# Patient Record
Sex: Female | Born: 1935 | Race: White | Hispanic: No | Smoking: Never smoker
Health system: Southern US, Community
[De-identification: ages and names within clinical notes are randomized; demographics above are authoritative.]

## PROBLEM LIST (undated history)

## (undated) DIAGNOSIS — R32 Unspecified urinary incontinence: Secondary | ICD-10-CM

## (undated) DIAGNOSIS — E1159 Type 2 diabetes mellitus with other circulatory complications: Secondary | ICD-10-CM

## (undated) DIAGNOSIS — M25559 Pain in unspecified hip: Secondary | ICD-10-CM

## (undated) DIAGNOSIS — F32A Depression, unspecified: Secondary | ICD-10-CM

## (undated) DIAGNOSIS — I872 Venous insufficiency (chronic) (peripheral): Secondary | ICD-10-CM

## (undated) DIAGNOSIS — Z8673 Personal history of transient ischemic attack (TIA), and cerebral infarction without residual deficits: Secondary | ICD-10-CM

## (undated) DIAGNOSIS — M4800 Spinal stenosis, site unspecified: Secondary | ICD-10-CM

## (undated) DIAGNOSIS — E550 Rickets, active: Secondary | ICD-10-CM

## (undated) DIAGNOSIS — I739 Peripheral vascular disease, unspecified: Secondary | ICD-10-CM

## (undated) DIAGNOSIS — G450 Vertebro-basilar artery syndrome: Secondary | ICD-10-CM

## (undated) DIAGNOSIS — I10 Essential (primary) hypertension: Secondary | ICD-10-CM

## (undated) DIAGNOSIS — I471 Supraventricular tachycardia: Secondary | ICD-10-CM

## (undated) DIAGNOSIS — F329 Major depressive disorder, single episode, unspecified: Secondary | ICD-10-CM

## (undated) DIAGNOSIS — G609 Hereditary and idiopathic neuropathy, unspecified: Secondary | ICD-10-CM

## (undated) DIAGNOSIS — I4719 Other supraventricular tachycardia: Secondary | ICD-10-CM

## (undated) DIAGNOSIS — F419 Anxiety disorder, unspecified: Secondary | ICD-10-CM

## (undated) DIAGNOSIS — M25579 Pain in unspecified ankle and joints of unspecified foot: Secondary | ICD-10-CM

## (undated) DIAGNOSIS — R197 Diarrhea, unspecified: Secondary | ICD-10-CM

## (undated) DIAGNOSIS — M810 Age-related osteoporosis without current pathological fracture: Secondary | ICD-10-CM

## (undated) DIAGNOSIS — R269 Unspecified abnormalities of gait and mobility: Secondary | ICD-10-CM

## (undated) DIAGNOSIS — R151 Fecal smearing: Secondary | ICD-10-CM

## (undated) DIAGNOSIS — L84 Corns and callosities: Secondary | ICD-10-CM

## (undated) DIAGNOSIS — L2089 Other atopic dermatitis: Secondary | ICD-10-CM

## (undated) DIAGNOSIS — E559 Vitamin D deficiency, unspecified: Secondary | ICD-10-CM

## (undated) DIAGNOSIS — G459 Transient cerebral ischemic attack, unspecified: Secondary | ICD-10-CM

## (undated) DIAGNOSIS — H3530 Unspecified macular degeneration: Secondary | ICD-10-CM

## (undated) DIAGNOSIS — R609 Edema, unspecified: Secondary | ICD-10-CM

## (undated) DIAGNOSIS — S72143A Displaced intertrochanteric fracture of unspecified femur, initial encounter for closed fracture: Secondary | ICD-10-CM

## (undated) DIAGNOSIS — R21 Rash and other nonspecific skin eruption: Secondary | ICD-10-CM

## (undated) DIAGNOSIS — E785 Hyperlipidemia, unspecified: Secondary | ICD-10-CM

## (undated) DIAGNOSIS — M79609 Pain in unspecified limb: Secondary | ICD-10-CM

## (undated) DIAGNOSIS — G4700 Insomnia, unspecified: Secondary | ICD-10-CM

## (undated) DIAGNOSIS — G629 Polyneuropathy, unspecified: Secondary | ICD-10-CM

## (undated) DIAGNOSIS — M722 Plantar fascial fibromatosis: Secondary | ICD-10-CM

## (undated) HISTORY — DX: Rash and other nonspecific skin eruption: R21

## (undated) HISTORY — DX: Unspecified urinary incontinence: R32

## (undated) HISTORY — DX: Unspecified abnormalities of gait and mobility: R26.9

## (undated) HISTORY — DX: Other atopic dermatitis: L20.89

## (undated) HISTORY — PX: BREAST MASS EXCISION: SHX1267

## (undated) HISTORY — DX: Vitamin D deficiency, unspecified: E55.9

## (undated) HISTORY — DX: Personal history of transient ischemic attack (TIA), and cerebral infarction without residual deficits: Z86.73

## (undated) HISTORY — DX: Displaced intertrochanteric fracture of unspecified femur, initial encounter for closed fracture: S72.143A

## (undated) HISTORY — DX: Diarrhea, unspecified: R19.7

## (undated) HISTORY — DX: Hereditary and idiopathic neuropathy, unspecified: G60.9

## (undated) HISTORY — DX: Fecal smearing: R15.1

## (undated) HISTORY — PX: TONSILLECTOMY AND ADENOIDECTOMY: SHX28

## (undated) HISTORY — DX: Age-related osteoporosis without current pathological fracture: M81.0

## (undated) HISTORY — DX: Pain in unspecified hip: M25.559

## (undated) HISTORY — DX: Corns and callosities: L84

## (undated) HISTORY — DX: Peripheral vascular disease, unspecified: I73.9

## (undated) HISTORY — DX: Spinal stenosis, site unspecified: M48.00

## (undated) HISTORY — DX: Pain in unspecified limb: M79.609

## (undated) HISTORY — DX: Type 2 diabetes mellitus with other circulatory complications: E11.59

## (undated) HISTORY — DX: Edema, unspecified: R60.9

## (undated) HISTORY — DX: Pain in unspecified ankle and joints of unspecified foot: M25.579

## (undated) HISTORY — DX: Unspecified macular degeneration: H35.30

---

## 1962-10-18 HISTORY — PX: CHOLECYSTECTOMY: SHX55

## 1998-07-31 ENCOUNTER — Other Ambulatory Visit: Admission: RE | Admit: 1998-07-31 | Discharge: 1998-07-31 | Payer: Self-pay | Admitting: Obstetrics and Gynecology

## 1999-10-19 HISTORY — PX: INCISION / DRAINAGE HAND / FINGER: SUR695

## 1999-11-27 ENCOUNTER — Ambulatory Visit (HOSPITAL_COMMUNITY): Admission: RE | Admit: 1999-11-27 | Discharge: 1999-11-27 | Payer: Self-pay | Admitting: Gastroenterology

## 1999-11-27 ENCOUNTER — Encounter (INDEPENDENT_AMBULATORY_CARE_PROVIDER_SITE_OTHER): Payer: Self-pay | Admitting: Specialist

## 2000-05-08 ENCOUNTER — Inpatient Hospital Stay (HOSPITAL_COMMUNITY): Admission: EM | Admit: 2000-05-08 | Discharge: 2000-05-11 | Payer: Self-pay | Admitting: Orthopedic Surgery

## 2000-05-15 ENCOUNTER — Encounter (HOSPITAL_COMMUNITY): Admission: RE | Admit: 2000-05-15 | Discharge: 2000-05-15 | Payer: Self-pay | Admitting: *Deleted

## 2000-06-16 ENCOUNTER — Other Ambulatory Visit: Admission: RE | Admit: 2000-06-16 | Discharge: 2000-06-16 | Payer: Self-pay | Admitting: Obstetrics and Gynecology

## 2001-07-10 ENCOUNTER — Other Ambulatory Visit: Admission: RE | Admit: 2001-07-10 | Discharge: 2001-07-10 | Payer: Self-pay | Admitting: Obstetrics and Gynecology

## 2001-10-18 HISTORY — PX: DILATION AND CURETTAGE OF UTERUS: SHX78

## 2002-02-14 ENCOUNTER — Ambulatory Visit (HOSPITAL_COMMUNITY): Admission: RE | Admit: 2002-02-14 | Discharge: 2002-02-14 | Payer: Self-pay | Admitting: Obstetrics and Gynecology

## 2002-02-14 ENCOUNTER — Encounter (INDEPENDENT_AMBULATORY_CARE_PROVIDER_SITE_OTHER): Payer: Self-pay

## 2002-09-04 ENCOUNTER — Other Ambulatory Visit: Admission: RE | Admit: 2002-09-04 | Discharge: 2002-09-04 | Payer: Self-pay | Admitting: Obstetrics and Gynecology

## 2002-10-28 DIAGNOSIS — Z8673 Personal history of transient ischemic attack (TIA), and cerebral infarction without residual deficits: Secondary | ICD-10-CM

## 2002-10-28 HISTORY — DX: Personal history of transient ischemic attack (TIA), and cerebral infarction without residual deficits: Z86.73

## 2003-04-18 ENCOUNTER — Encounter: Admission: RE | Admit: 2003-04-18 | Discharge: 2003-04-18 | Payer: Self-pay | Admitting: Internal Medicine

## 2003-04-18 ENCOUNTER — Encounter: Payer: Self-pay | Admitting: Internal Medicine

## 2003-07-13 ENCOUNTER — Encounter: Payer: Self-pay | Admitting: *Deleted

## 2003-07-13 ENCOUNTER — Inpatient Hospital Stay (HOSPITAL_COMMUNITY): Admission: EM | Admit: 2003-07-13 | Discharge: 2003-07-14 | Payer: Self-pay | Admitting: Emergency Medicine

## 2003-07-26 ENCOUNTER — Encounter: Payer: Self-pay | Admitting: Cardiovascular Disease

## 2003-07-26 ENCOUNTER — Ambulatory Visit (HOSPITAL_COMMUNITY): Admission: RE | Admit: 2003-07-26 | Discharge: 2003-07-26 | Payer: Self-pay | Admitting: Cardiovascular Disease

## 2004-05-27 ENCOUNTER — Encounter: Admission: RE | Admit: 2004-05-27 | Discharge: 2004-05-27 | Payer: Self-pay | Admitting: Internal Medicine

## 2005-06-17 ENCOUNTER — Ambulatory Visit (HOSPITAL_COMMUNITY): Admission: RE | Admit: 2005-06-17 | Discharge: 2005-06-17 | Payer: Self-pay | Admitting: Gastroenterology

## 2005-09-13 ENCOUNTER — Ambulatory Visit (HOSPITAL_COMMUNITY): Admission: RE | Admit: 2005-09-13 | Discharge: 2005-09-13 | Payer: Self-pay | Admitting: Internal Medicine

## 2006-08-17 ENCOUNTER — Encounter: Admission: RE | Admit: 2006-08-17 | Discharge: 2006-08-17 | Payer: Self-pay | Admitting: Internal Medicine

## 2007-05-25 ENCOUNTER — Encounter: Admission: RE | Admit: 2007-05-25 | Discharge: 2007-05-25 | Payer: Self-pay | Admitting: Internal Medicine

## 2007-10-25 DIAGNOSIS — R609 Edema, unspecified: Secondary | ICD-10-CM | POA: Insufficient documentation

## 2007-10-25 HISTORY — DX: Edema, unspecified: R60.9

## 2007-11-07 DIAGNOSIS — E559 Vitamin D deficiency, unspecified: Secondary | ICD-10-CM

## 2007-11-07 HISTORY — DX: Vitamin D deficiency, unspecified: E55.9

## 2008-02-05 DIAGNOSIS — L84 Corns and callosities: Secondary | ICD-10-CM

## 2008-02-05 HISTORY — DX: Corns and callosities: L84

## 2008-04-05 ENCOUNTER — Emergency Department (HOSPITAL_COMMUNITY): Admission: EM | Admit: 2008-04-05 | Discharge: 2008-04-05 | Payer: Self-pay | Admitting: Emergency Medicine

## 2008-05-13 DIAGNOSIS — R32 Unspecified urinary incontinence: Secondary | ICD-10-CM

## 2008-05-13 HISTORY — DX: Unspecified urinary incontinence: R32

## 2008-07-04 ENCOUNTER — Encounter: Admission: RE | Admit: 2008-07-04 | Discharge: 2008-07-04 | Payer: Self-pay | Admitting: Internal Medicine

## 2008-08-07 DIAGNOSIS — M25579 Pain in unspecified ankle and joints of unspecified foot: Secondary | ICD-10-CM

## 2008-08-07 HISTORY — DX: Pain in unspecified ankle and joints of unspecified foot: M25.579

## 2008-10-18 DIAGNOSIS — M25559 Pain in unspecified hip: Secondary | ICD-10-CM

## 2008-10-18 HISTORY — DX: Pain in unspecified hip: M25.559

## 2008-11-04 ENCOUNTER — Encounter: Admission: RE | Admit: 2008-11-04 | Discharge: 2008-11-04 | Payer: Self-pay | Admitting: Internal Medicine

## 2008-11-15 ENCOUNTER — Encounter: Admission: RE | Admit: 2008-11-15 | Discharge: 2008-11-15 | Payer: Self-pay | Admitting: Sports Medicine

## 2008-11-18 DIAGNOSIS — M4800 Spinal stenosis, site unspecified: Secondary | ICD-10-CM

## 2008-11-18 DIAGNOSIS — R269 Unspecified abnormalities of gait and mobility: Secondary | ICD-10-CM | POA: Insufficient documentation

## 2008-11-18 DIAGNOSIS — M79609 Pain in unspecified limb: Secondary | ICD-10-CM

## 2008-11-18 HISTORY — DX: Unspecified abnormalities of gait and mobility: R26.9

## 2008-11-18 HISTORY — DX: Pain in unspecified limb: M79.609

## 2008-11-18 HISTORY — DX: Spinal stenosis, site unspecified: M48.00

## 2009-08-11 ENCOUNTER — Ambulatory Visit: Payer: Self-pay | Admitting: Family Medicine

## 2009-08-11 DIAGNOSIS — E785 Hyperlipidemia, unspecified: Secondary | ICD-10-CM | POA: Insufficient documentation

## 2009-08-11 DIAGNOSIS — K589 Irritable bowel syndrome without diarrhea: Secondary | ICD-10-CM | POA: Insufficient documentation

## 2009-08-11 DIAGNOSIS — I10 Essential (primary) hypertension: Secondary | ICD-10-CM | POA: Insufficient documentation

## 2009-08-11 DIAGNOSIS — Z8679 Personal history of other diseases of the circulatory system: Secondary | ICD-10-CM | POA: Insufficient documentation

## 2009-08-29 ENCOUNTER — Encounter: Admission: RE | Admit: 2009-08-29 | Discharge: 2009-08-29 | Payer: Self-pay | Admitting: Internal Medicine

## 2009-12-18 ENCOUNTER — Encounter: Admission: RE | Admit: 2009-12-18 | Discharge: 2009-12-18 | Payer: Self-pay | Admitting: Neurology

## 2010-02-15 HISTORY — PX: HIP FRACTURE SURGERY: SHX118

## 2010-03-04 DIAGNOSIS — S72143A Displaced intertrochanteric fracture of unspecified femur, initial encounter for closed fracture: Secondary | ICD-10-CM

## 2010-03-04 HISTORY — DX: Displaced intertrochanteric fracture of unspecified femur, initial encounter for closed fracture: S72.143A

## 2010-03-05 ENCOUNTER — Inpatient Hospital Stay (HOSPITAL_COMMUNITY): Admission: EM | Admit: 2010-03-05 | Discharge: 2010-03-09 | Payer: Self-pay | Admitting: Emergency Medicine

## 2010-03-09 ENCOUNTER — Encounter (INDEPENDENT_AMBULATORY_CARE_PROVIDER_SITE_OTHER): Payer: Self-pay | Admitting: Cardiovascular Disease

## 2010-08-07 IMAGING — RF DG HIP COMPLETE 2+V*R*
1 series · 8 of 8 positions shown · non-contrast
Comparison: 03/05/2010.

CLINICAL DATA: Right hip fracture.  ORIF.

RIGHT HIP - COMPLETE 2+ VIEW

[Series 1: run · 8 of 8 slices shown]
[im 1/8]
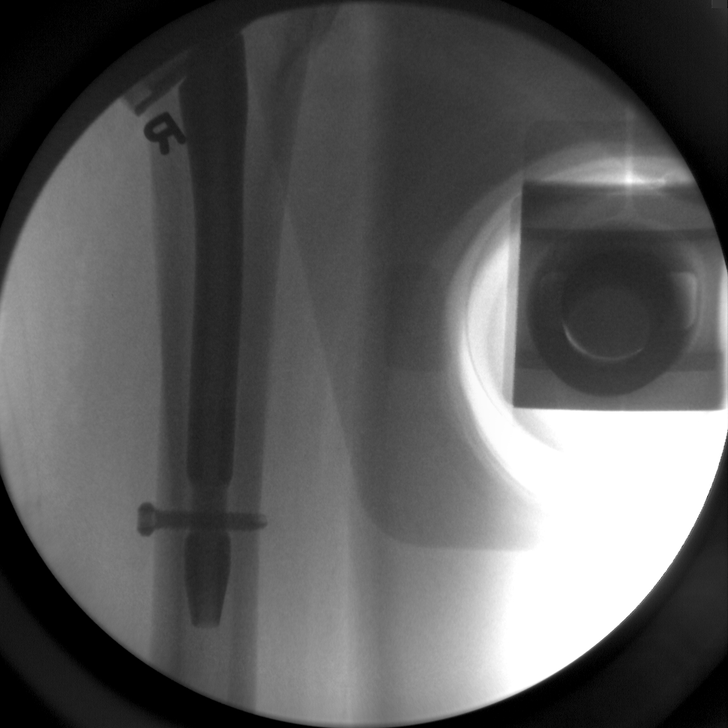
[im 2/8]
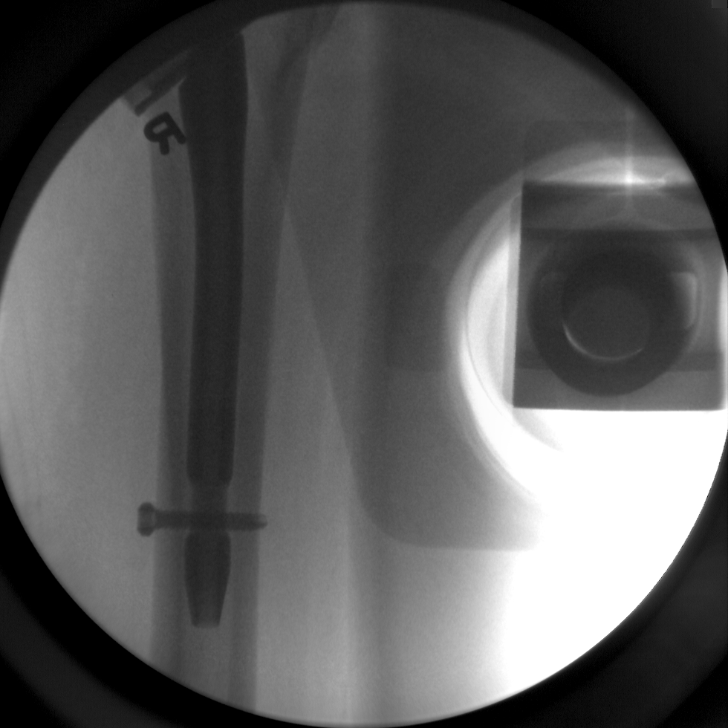
[im 3/8]
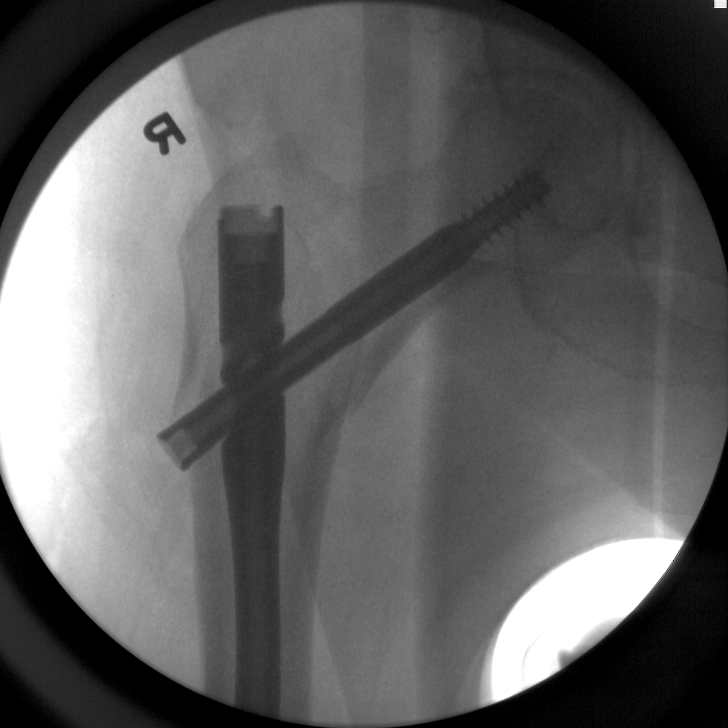
[im 4/8]
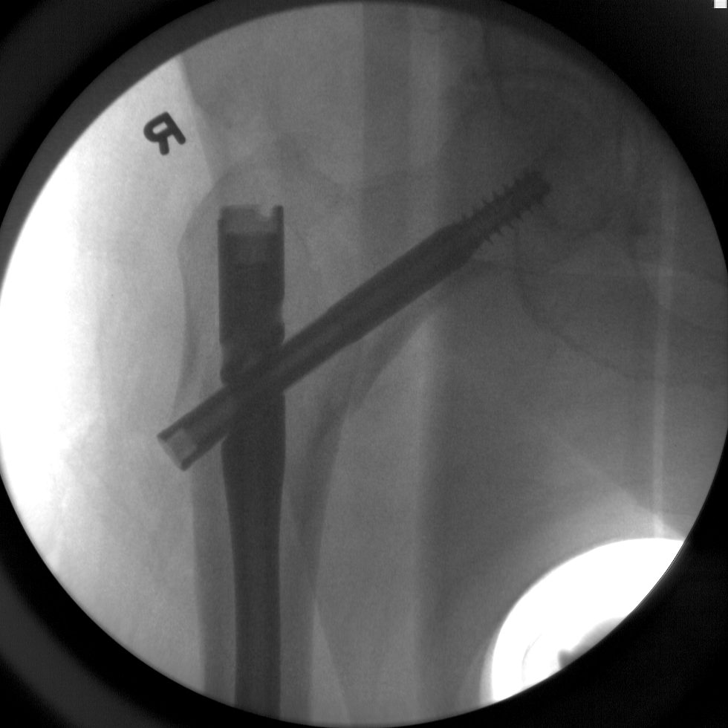
[im 5/8]
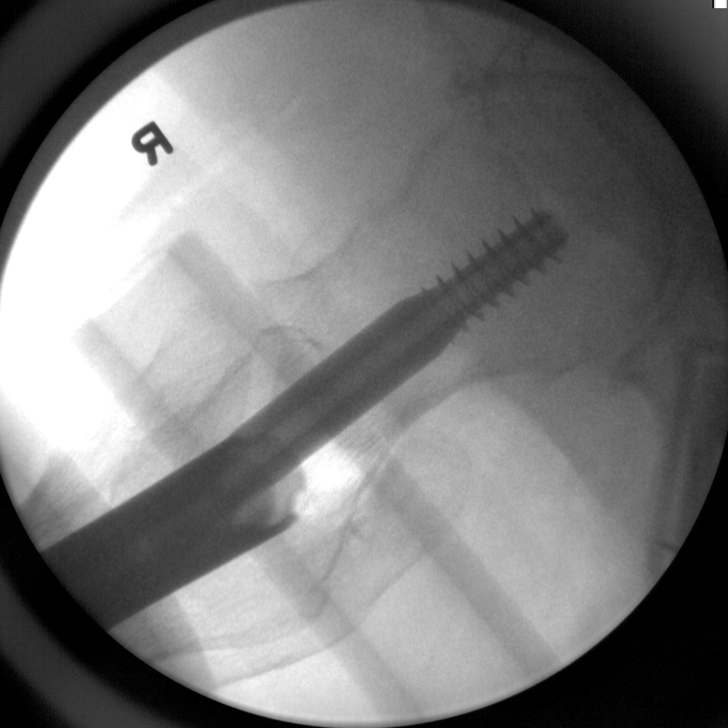
[im 6/8]
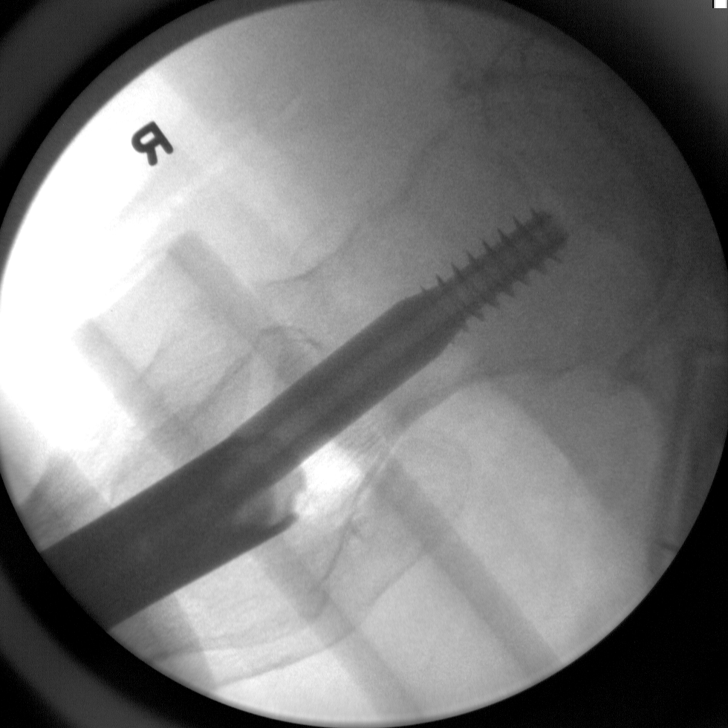
[im 7/8]
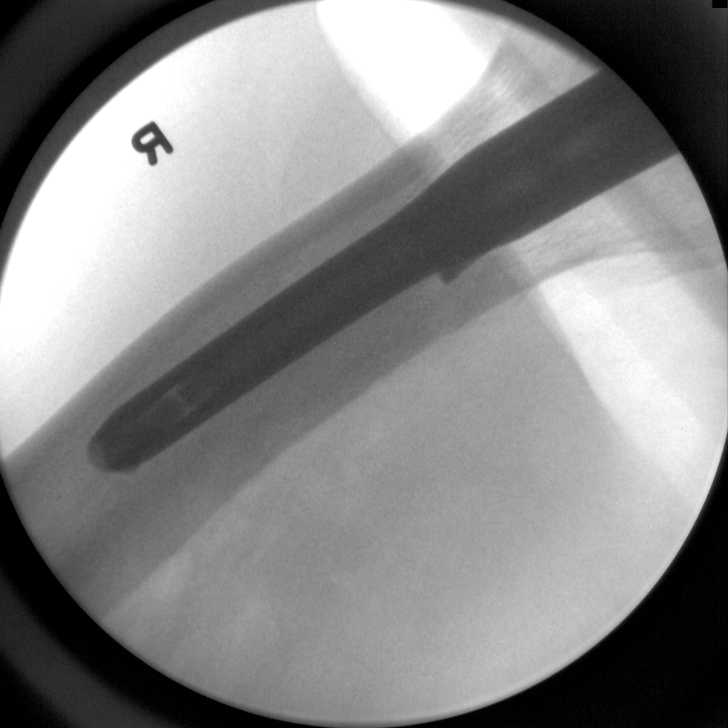
[im 8/8]
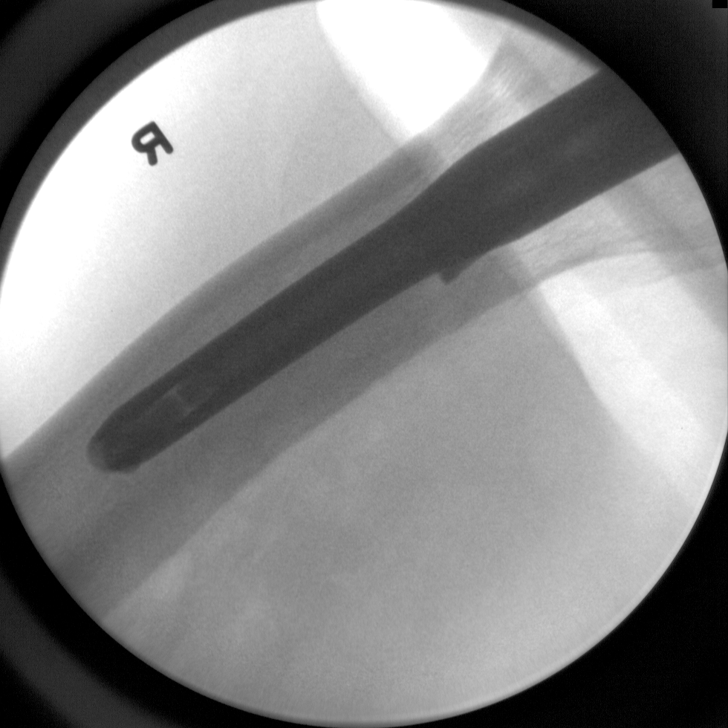

[8 of 8 positions shown; findings below may reference images not displayed]

FINDINGS: Intertrochanteric fracture has  been fixed with a
compression screw and nail.  Hardware is in good position.  The
fracture is in satisfactory alignment.
IMPRESSION: Satisfactory fracture fixation.

## 2010-08-07 IMAGING — CR DG CHEST 1V
1 series · 1 of 1 positions shown · non-contrast
Comparison: None

CLINICAL DATA: Right hip fracture status post fall

CHEST - 1 VIEW

[t chest supine]
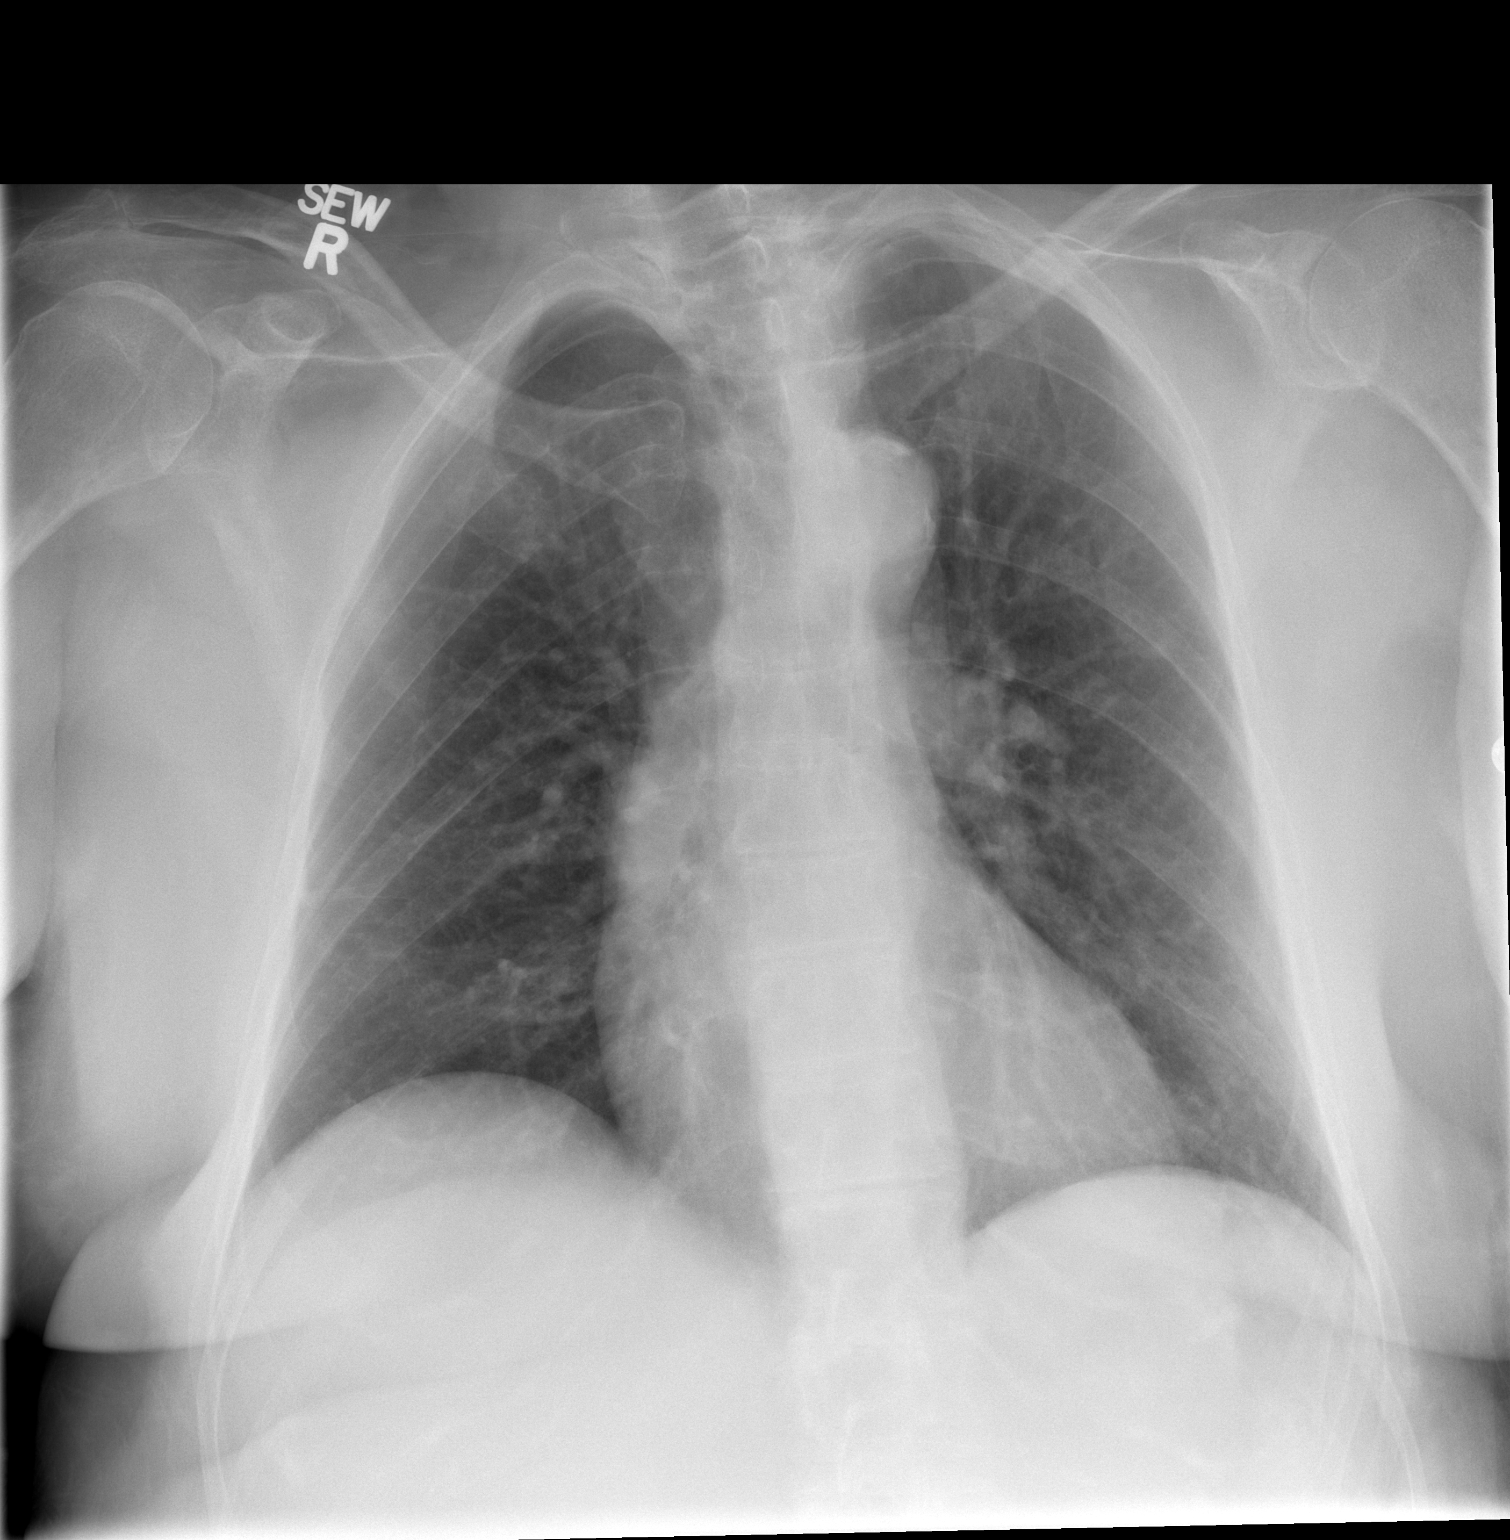

[1 of 1 positions shown; findings below may reference images not displayed]

FINDINGS: Normal heart size and pulmonary vascularity.
Atherosclerotic calcifications of mildly tortuous thoracic aorta.
Lungs clear.
Bones demineralized.
IMPRESSION: No acute abnormalities.

## 2010-09-17 ENCOUNTER — Encounter: Admission: RE | Admit: 2010-09-17 | Discharge: 2010-09-17 | Payer: Self-pay | Admitting: Internal Medicine

## 2010-11-08 ENCOUNTER — Encounter: Payer: Self-pay | Admitting: Gastroenterology

## 2010-12-18 HISTORY — PX: COLONOSCOPY: SHX174

## 2010-12-18 HISTORY — PX: ESOPHAGOGASTRODUODENOSCOPY ENDOSCOPY: SHX5814

## 2010-12-21 DIAGNOSIS — R21 Rash and other nonspecific skin eruption: Secondary | ICD-10-CM

## 2010-12-21 HISTORY — DX: Rash and other nonspecific skin eruption: R21

## 2011-01-04 LAB — CBC
HCT: 29.7 % — ABNORMAL LOW (ref 36.0–46.0)
HCT: 32.2 % — ABNORMAL LOW (ref 36.0–46.0)
HCT: 33 % — ABNORMAL LOW (ref 36.0–46.0)
HCT: 42.6 % (ref 36.0–46.0)
Hemoglobin: 10.3 g/dL — ABNORMAL LOW (ref 12.0–15.0)
Hemoglobin: 11.2 g/dL — ABNORMAL LOW (ref 12.0–15.0)
Hemoglobin: 11.3 g/dL — ABNORMAL LOW (ref 12.0–15.0)
MCHC: 34.3 g/dL (ref 30.0–36.0)
MCHC: 34.5 g/dL (ref 30.0–36.0)
MCHC: 34.6 g/dL (ref 30.0–36.0)
MCHC: 34.8 g/dL (ref 30.0–36.0)
MCV: 96.5 fL (ref 78.0–100.0)
MCV: 96.5 fL (ref 78.0–100.0)
MCV: 97 fL (ref 78.0–100.0)
MCV: 97.1 fL (ref 78.0–100.0)
Platelets: 210 10*3/uL (ref 150–400)
Platelets: 211 10*3/uL (ref 150–400)
Platelets: 220 10*3/uL (ref 150–400)
Platelets: 297 10*3/uL (ref 150–400)
RBC: 3.39 MIL/uL — ABNORMAL LOW (ref 3.87–5.11)
RBC: 4.44 MIL/uL (ref 3.87–5.11)
RDW: 13.1 % (ref 11.5–15.5)
RDW: 13.2 % (ref 11.5–15.5)
RDW: 13.3 % (ref 11.5–15.5)
RDW: 13.3 % (ref 11.5–15.5)
WBC: 14.1 10*3/uL — ABNORMAL HIGH (ref 4.0–10.5)
WBC: 15.9 10*3/uL — ABNORMAL HIGH (ref 4.0–10.5)

## 2011-01-04 LAB — BASIC METABOLIC PANEL
BUN: 6 mg/dL (ref 6–23)
BUN: 8 mg/dL (ref 6–23)
CO2: 22 mEq/L (ref 19–32)
CO2: 26 mEq/L (ref 19–32)
CO2: 26 mEq/L (ref 19–32)
Calcium: 8.8 mg/dL (ref 8.4–10.5)
Calcium: 9 mg/dL (ref 8.4–10.5)
Chloride: 104 mEq/L (ref 96–112)
Chloride: 104 mEq/L (ref 96–112)
Chloride: 105 mEq/L (ref 96–112)
Chloride: 106 mEq/L (ref 96–112)
Creatinine, Ser: 0.79 mg/dL (ref 0.4–1.2)
Creatinine, Ser: 0.82 mg/dL (ref 0.4–1.2)
GFR calc Af Amer: >60 mL/min (ref >60)
GFR calc Af Amer: >60 mL/min (ref >60)
GFR calc Af Amer: >60 mL/min (ref >60)
GFR calc non Af Amer: >60 mL/min (ref >60)
GFR calc non Af Amer: >60 mL/min (ref >60)
Glucose, Bld: 197 mg/dL — ABNORMAL HIGH (ref 70–99)
Glucose, Bld: 219 mg/dL — ABNORMAL HIGH (ref 70–99)
Potassium: 3.6 mEq/L (ref 3.5–5.1)
Potassium: 3.8 mEq/L (ref 3.5–5.1)
Potassium: 4 mEq/L (ref 3.5–5.1)
Potassium: 4.1 mEq/L (ref 3.5–5.1)
Sodium: 135 mEq/L (ref 135–145)
Sodium: 137 mEq/L (ref 135–145)
Sodium: 137 mEq/L (ref 135–145)

## 2011-01-04 LAB — URINALYSIS, ROUTINE W REFLEX MICROSCOPIC
Bilirubin Urine: NEGATIVE
Glucose, UA: NEGATIVE mg/dL
Hgb urine dipstick: NEGATIVE
Protein, ur: NEGATIVE mg/dL
Urobilinogen, UA: 0.2 mg/dL (ref 0.0–1.0)

## 2011-01-04 LAB — CK TOTAL AND CKMB (NOT AT ARMC)
Relative Index: INVALID (ref 0.0–2.5)
Total CK: 77 U/L (ref 7–177)

## 2011-01-04 LAB — BLOOD GAS, ARTERIAL
Acid-base deficit: 1.6 mmol/L (ref 0.0–2.0)
Bicarbonate: 22.1 mEq/L (ref 20.0–24.0)
O2 Saturation: 95.9 %
Patient temperature: 98.6
TCO2: 23.1 mmol/L (ref 0–100)
pO2, Arterial: 71.7 mmHg — ABNORMAL LOW (ref 80.0–100.0)

## 2011-01-04 LAB — COMPREHENSIVE METABOLIC PANEL
AST: 30 U/L (ref 0–37)
Albumin: 3 g/dL — ABNORMAL LOW (ref 3.5–5.2)
Calcium: 8.8 mg/dL (ref 8.4–10.5)
Creatinine, Ser: 0.81 mg/dL (ref 0.4–1.2)
GFR calc Af Amer: >60 mL/min (ref >60)
GFR calc non Af Amer: >60 mL/min (ref >60)

## 2011-01-04 LAB — GLUCOSE, CAPILLARY
Glucose-Capillary: 159 mg/dL — ABNORMAL HIGH (ref 70–99)
Glucose-Capillary: 193 mg/dL — ABNORMAL HIGH (ref 70–99)
Glucose-Capillary: 210 mg/dL — ABNORMAL HIGH (ref 70–99)
Glucose-Capillary: 216 mg/dL — ABNORMAL HIGH (ref 70–99)

## 2011-01-04 LAB — CARDIAC PANEL(CRET KIN+CKTOT+MB+TROPI)
CK, MB: 3.1 ng/mL (ref 0.3–4.0)
Relative Index: 1.6 (ref 0.0–2.5)
Relative Index: 1.6 (ref 0.0–2.5)
Total CK: 207 U/L — ABNORMAL HIGH (ref 7–177)
Total CK: 298 U/L — ABNORMAL HIGH (ref 7–177)
Troponin I: 0.03 ng/mL (ref 0.00–0.06)
Troponin I: 0.12 ng/mL — ABNORMAL HIGH (ref 0.00–0.06)
Troponin I: 0.16 ng/mL — ABNORMAL HIGH (ref 0.00–0.06)

## 2011-01-04 LAB — URINE CULTURE
Colony Count: NO GROWTH
Culture: NO GROWTH

## 2011-01-04 LAB — DIFFERENTIAL
Basophils Absolute: 0 10*3/uL (ref 0.0–0.1)
Basophils Absolute: 0.1 10*3/uL (ref 0.0–0.1)
Eosinophils Absolute: 0.2 10*3/uL (ref 0.0–0.7)
Eosinophils Absolute: 0.4 10*3/uL (ref 0.0–0.7)
Eosinophils Relative: 1 % (ref 0–5)
Eosinophils Relative: 1 % (ref 0–5)
Eosinophils Relative: 3 % (ref 0–5)
Lymphocytes Relative: 12 % (ref 12–46)
Lymphocytes Relative: 14 % (ref 12–46)
Lymphs Abs: 1.9 10*3/uL (ref 0.7–4.0)
Monocytes Absolute: 1.6 10*3/uL — ABNORMAL HIGH (ref 0.1–1.0)
Monocytes Absolute: 2.2 10*3/uL — ABNORMAL HIGH (ref 0.1–1.0)
Monocytes Relative: 11 % (ref 3–12)
Neutrophils Relative %: 81 % — ABNORMAL HIGH (ref 43–77)

## 2011-01-04 LAB — PHOSPHORUS
Phosphorus: 2.3 mg/dL (ref 2.3–4.6)
Phosphorus: 3 mg/dL (ref 2.3–4.6)
Phosphorus: 3.7 mg/dL (ref 2.3–4.6)

## 2011-01-04 LAB — POCT I-STAT, CHEM 8
BUN: 11 mg/dL (ref 6–23)
Chloride: 108 mEq/L (ref 96–112)
Creatinine, Ser: 0.7 mg/dL (ref 0.4–1.2)
Glucose, Bld: 206 mg/dL — ABNORMAL HIGH (ref 70–99)
Hemoglobin: 15.6 g/dL — ABNORMAL HIGH (ref 12.0–15.0)
Potassium: 4.2 mEq/L (ref 3.5–5.1)
Sodium: 136 mEq/L (ref 135–145)

## 2011-01-04 LAB — MAGNESIUM
Magnesium: 1.8 mg/dL (ref 1.5–2.5)
Magnesium: 2 mg/dL (ref 1.5–2.5)

## 2011-01-04 LAB — BRAIN NATRIURETIC PEPTIDE: Pro B Natriuretic peptide (BNP): 161 pg/mL — ABNORMAL HIGH (ref 0.0–100.0)

## 2011-01-04 LAB — PROTIME-INR: Prothrombin Time: 14 seconds (ref 11.6–15.2)

## 2011-01-04 LAB — TSH: TSH: 0.567 u[IU]/mL (ref 0.350–4.500)

## 2011-01-04 LAB — APTT: aPTT: 29 seconds (ref 24–37)

## 2011-03-02 NOTE — Consult Note (Signed)
NAME:  Kara Harris, Kara Harris NO.:  [id number]   MEDICAL RECORD NO.:  [id number]          PATIENT TYPE:  EMS   LOCATION:  MAJO                         FACILITY:  [facility]   PHYSICIAN:  Levert Feinstein, MD          DATE OF BIRTH:  1923-06-28   DATE OF CONSULTATION:  04/05/2008  DATE OF DISCHARGE:                                 CONSULTATION   CHIEF COMPLAINT:  Stroke.   HISTORY OF PRESENT ILLNESS:  Kara Harris is an 77 year old right-handed female  with past medical history of hypertension, hyperlipidemia, urinary  urgency and reported history of TIA in 2004, presenting with acute onset  of left upper and lower extremity paraesthesia and weakness when she  woke up at 8 o'clock this morning, trying to go to the bathroom.  The  symptom lasted about 5 minutes, gradually subsided, now she is back to  her baseline.   REVIEW OF SYSTEMS:  She denied visual change, lateralized motor and  sensory deficit, chest pain, headache, GI or GU symptoms.   PAST MEDICAL HISTORY:  1. Hypertension.  2. Hyperlipidemia.  3. TIA.   SURGICAL HISTORY:  Cholecystectomy.  Breast, benign tumor.   SOCIAL HISTORY:  She denies smoking, drinking.  Resigned.  Currently in  retirement home for past 3 years.   FAMILY HISTORY:  Mother died of intracranial bleeding at age 73.  Father  died at age 52 from stroke and colon cancer.   PHYSICAL EXAMINATION:  VITAL SIGNS:  She is afebrile.  Blood pressure  128/62, heart rate of 70, and respirations of 21.  CARDIAC:  Regular rate and rhythm.  PULMONARY:  Clear to auscultation bilaterally.  NECK:  Supple.  No carotid bruits.  NEUROLOGICAL:  She is alert, oriented to history taking and casual  conversation.  Cranial nerves II-XII.  Pupils were equal, round,  reactive to light and accommodation.  Fundi were sharp bilaterally.  Visual fields were full on confrontational test.  Facial sensation and  strength were normal.  Uvula and tongue midline.  Head turning, shoulder  shrugging were normal and symmetric.  Tongue protrusion into cheek  strength was normal.  Hearing was intact to finger rubbing bilaterally.  Motor examination:  Normal tone, bulk, and strength.  Sensory normal to  light touch, pinprick.  There was no extinction on double spontaenous  stimulation.  Deep tendon reflexes were normal, symmetric.  Plantar  responses are flexor. Coordination:  Normal finger-to-nose, heel-to-  shin.  Gait was deferred.   CT of the head without contrast reviewed.  there was no acute change.   LAB EVALUATION:  Demonstrated normal CBC with mildly elevated glucose  level and troponin was negative.   ASSESSMENT AND PLAN:  1. An 77 year old female with risk factor of hypertension,      hyperlipidemia, presenting with acute but short-lasting left      hemiparesis and subjective weakness, which indicates a possible      right internal capsule transient ischemic attack.  I have discussed      with the patient because she reported to have  extensive previous      stroke evaluation including MRI, an echocardiogram, which were was      reported to be normal, and because of the transient characteristic      of her symtpoms and likely, it is a small vessel disease, and she      is waiting to continue the stroke workup as an outpatient.  She is      to follow up with me in [facility] in 1-2 weeks.  2. I changed her aspirin from 81 to 325.  Prescription was provided.      Levert Feinstein, MD  Electronically Signed     YY/MEDQ  D:  04/05/2008  T:  04/06/2008  Job:  [id number]

## 2011-03-05 NOTE — Op Note (Signed)
[facility]  Patient:    Kara Harris, Kara Harris                         MRN: [id number] Proc. Date: 05/08/00 Adm. Date:  [id number] Attending:  Dominica Severin                           Operative Report  DATE OF BIRTH:  [birth year]  PREOPERATIVE DIAGNOSIS:  Status post cat bite to the right index finger with ascending cellulitis, red streaks, and areas of pyruance, 24 hours out from injury.  POSTOPERATIVE DIAGNOSIS:  Status post cat bite to the right index finger with ascending cellulitis, red streaks, and areas of pyruance, 24 hours out from injury with septic proximal interphalangeal joint and extensor tendon laceration to the right index finger.  PROCEDURE PERFORMED:  I&D of skin and subcutaneous tissue and extensor tendon, and PIP joint.  This patient had an open PIP joint secondary to a cat bite. Capsulotomy was performed gently and thorough irrigation applied.  SURGEON:  Elisha Ponder, M.D.  ASSISTANT:  None.  COMPLICATIONS:  None.  ANESTHESIA:  Local 1% lidocaine.  TOURNIQUET TIME:  Less than 30 minutes.  INDICATIONS FOR PROCEDURE:  This patient is a very pleasant 77 year old white female who presents with the above mentioned diagnosis.  I have counseled her in regards to the risks and benefits of I&D including bleeding, infection, anesthesia, damage to normal structures, and failure of surgery to accomplish its intended goals of relieving symptoms and restoring function.  With this in mind, she desires to proceed.  Her written H&P is included in the chart.  DESCRIPTION OF PROCEDURE:  The patient had lidocaine block placed sterilely by myself and performed with intermetacarpal block away from the area of cat bite.  She had swollen and infected-appearing PIP joint with red streaks, ascending cellulitis, etc. noted.  After metacarpal block was employed, the patient then underwent Betadine scrub by myself followed by sterile draping.  Once  this was done, an incision was made under tourniquet control about the ulnar aspect of the finger where two cat bites were located.  The patient had friable and _________ skin removed from the areas of entrance about the cat bites dorsal ulnarly.  These areas were opened and access to the deeper structures was obtained.  The proximal bite did not have significant extensor tendon encroachment, however, the distal bite penetrated the extensor tendon and went into the PIP joint.  Thus, the area was debrided including skin, subcutaneous tissue, and tendon.  A probe was used which identified the lacerated tendon that communicated with the PIP joint.  Thus, the PIP joint was opened with a blunt surgical instrument and a portion of the inflamed tissue removed from this region.  The PIP joint was then copiously irrigated. There was no atherogenic instability created during this.  The patient did have an open PIP joint.  Cultures were taken intraoperatively of the pyruant material, egressing from the regions.  This was sent for gram stain, C&S, aerobic and anaerobic culture.  The patient had I&D performed without difficulty.  Copious amounts of irrigation were applied to the wound.  Following this, the patient had a through-and-through wick dressing placed to my satisfaction.  The patient had adequate skin coverage over the joint and tendinous regions.  The wounds were left open and sutures were placed.  Hemostasis was obtained with electrocautery.  The patient then had a sterile dressing consisting of Adaptic gauze and a compressive wrap placed.  The patient tolerated the procedure well without difficulty.  She will be admitted for IV antibiotics, observation, and wound care.  Will treat this infection aggressively.  I have discussed with her these issues at length. There were no complications.  All questions were encouraged and answered. DD:  05/08/00 TD:  05/10/00 Job: [id number]

## 2011-03-05 NOTE — Op Note (Signed)
NAME:  Kara Harris, Kara Harris                  ACCOUNT NO.:  [id number]   MEDICAL RECORD NO.:  [id number]         PATIENT TYPE:  AMB   LOCATION:  ENDO                         FACILITY:  [facility]   PHYSICIAN:  James L. Malon Kindle., M.D.DATE OF BIRTH:  07/05/1923   DATE OF PROCEDURE:  06/17/2005  DATE OF DISCHARGE:                                 OPERATIVE REPORT   PROCEDURE:  Colonoscopy.   MEDICATIONS:  Fentanyl 85 mcg, versed 8.5 mg IV.   INDICATION:  Strong family history of colon cancer.   DESCRIPTION OF PROCEDURE:  The procedure was explained to the patient and  consent obtained.  With the patient in the left lateral decubitus position,  the Olympus scope was inserted and advanced.  Prep was excellent.  Using  position changes, we were able to see the ileocecal valve and the  appendiceal orifice.  The scope was withdrawn and the mucosa carefully  examined.  The mucosal pattern throughout was normal with no edema, colitis,  etc.  There were no polyps seen throughout the entire colon.  No significant  diverticular disease.  The scope was withdrawn.  The patient tolerated the  procedure well.   ASSESSMENT:  Family history of colon cancer with negative colonoscopy this  time (V16.0).   PLAN:  I will recommend yearly Hemoccults and repeat colonoscopy in 5 years.           ______________________________  Llana Aliment Malon Kindle., M.D.     Waldron Session  D:  06/17/2005  T:  06/17/2005  Job:  [id number]   cc:   Thora Lance, M.D.  [address]  [place]  [place] [address]  Fax: [phone]

## 2011-03-05 NOTE — Discharge Summary (Signed)
NAME:  Kara Harris, Kara Harris [facility]                          ACCOUNT NO.:  [id number]   MEDICAL RECORD NO.:  [id number]                   PATIENT TYPE:  INP   LOCATION:  2036                                 FACILITY:  [facility]   PHYSICIAN:  Nicki Guadalajara, M.D.                  DATE OF BIRTH:  08/25/1923   DATE OF ADMISSION:  07/13/2003  DATE OF DISCHARGE:  07/14/2003                                 DISCHARGE SUMMARY   HISTORY:  Ms. [name] is a 77 year old white female patient of Dr.  Nanetta Batty and Dr. Valentina Lucks, who came to the emergency room after being  at an Urgent Care Center secondary to paroxysmal supraventricular  tachycardia.  She was at the [employer], she felt dizzy, she had  palpitations, they seemed to subside, she drove herself to an Urgent Care  Center, there here heart rate was 167 and her blood pressure was 95/74, her  EKG shows questionable AVNRT, she also stated she had some presyncope with  this and some rising up in her chest feeling, no nausea or vomiting, some  shortness of breath.  She recently was put in study trial for urgency, she  stated a dosage, it was not effected, it was recently just doubled prior to  her leaving for the [employer] today she did take one of her study drug  pills, this is the only change in any of her medications.  She does have a  prior medical history of hypertension, hyperlipidemia, an overactive  bladder.  She has no known coronary disease.  She was seen by Dr. Charlette Caffey in  the hospital and it was decided to discontinue her spironolactone which was  used for hypertension and put her on a beta-blocker.  She was put on  Lopressor 25 mg b.i.d.  She was seen by Dr. Tresa Endo on July 14, 2003, she  apparently had no further arrythmia.  He felt comfortable with sending her  home, discontinuing the study drug and placing her on Toprol XL 50 mg once a  day.  She will get a 2D echocardiogram and persantine Cardiolite test as an  outpatient in our office.   LABORATORY DATA:  Hemoglobin 15.9, hematocrit 47.1, WBC 13, platelets were  266,000.  INR was 1.1, PTT was 37, sodium 132, potassium 4.2, glucose 106,  BUN 20.  CK/MB's were negative x2.  Troponin the second one was positive at  0.10, this was probably related to her heart rate.  TSH was 1.664.   DISCHARGE MEDICATIONS:  1. Toprol XL 50 mg daily.  2. Enteric-coated aspirin 81 mg daily.  3. Zocor 20 mg at bedtime.  4. Multivitamin and calcium as taken previously.   She did discontinue her study drug and her spironolactone.   DISCHARGE ACTIVITIES:  As tolerated.  She will need to have a 2D  echocardiogram and persantine Cardiolite.   DISCHARGE INSTRUCTIONS:  Our office will call her with the results of these.   DISCHARGE DIAGNOSES:  1. 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  2.     Hypertension.  3. Dyslipidemia.  4. Urinary urgency.      Lezlie Octave, N.P.                        Nicki Guadalajara, M.D.    BB/MEDQ  D:  07/14/2003  T:  07/14/2003  Job:  [id number]   cc:   Nanetta Batty, M.D.  [address]  [place]  [place] [address]  Fax: [phone]   Valentina Lucks, M.D.

## 2011-03-05 NOTE — Op Note (Signed)
[facility]  Patient:    Kara Harris, Kara Harris [facility] Visit Number: [id number] MRN: [id number]          Service Type: DSU Location: [facility] Attending Physician:  Morene Antu Dictated by:   Sherry A. Rosalio Macadamia, M.D. Proc. Date: 02/14/02 Admit Date:  02/14/2002                             Operative Report  PREOPERATIVE DIAGNOSES:       1. Postmenopausal bleeding.                               2. Bicornuate uterus.  POSTOPERATIVE DIAGNOSES:      1. Postmenopausal bleeding.                               2. Bicornuate uterus.  OPERATION:                    D&C and hysteroscopy.  SURGEON:                      Sherry A. Rosalio Macadamia, M.D.  ANESTHESIA:                   MAC.  INDICATIONS:                  This is a 77 year old G0, P0 woman who has a known history of bicornuate uterus.  The patient has a double cervix and double uterus.  The patient has had previous episodes of postmenopausal bleeding, and in 1995, had a D&C hysteroscopy for this with benign tissue found.  The patient noted recurrence of postmenopausal bleeding earlier this month.  Ultrasound was performed showing thickened endometrial tissue bilaterally, but with fluid present on the right side.  Because of this, the patient is brought to the operating room for [facility] hysteroscopy.  FINDINGS:                     Bicornuate uterus, mid position, normal size, no endometrial abnormality.  Only adventitial-type tissue present within the endometrial cavity, no polyps seen.  DESCRIPTION OF PROCEDURE:     The patient was brought into the operating room and given adequate IV sedation.  She was placed in the dorsolithotomy position.  The perineum was washed with Hibiclens.  The bladder was in and out catheterized.  Pelvic examination was performed.  The surgeons gowns and gloves were changed.  The patient was draped in a sterile fashion.  A speculum was placed within the vagina.  The vagina was washed  with Hibiclens. Paracervical block was administered with 1% Nesacaine.  The cervical connective tissue between the two cervical openings was grasped with a single tooth tenaculum.  The right cervix was sounded with a small sound and then dilated with Shawnie Pons dilators to a #21.  The left cervix was then sounded and dilated in a similar fashion.  The right cervix cornu and uterine horn was hysteroscoped with no significant findings.  The left cervix and uterine horn was then hysteroscoped with no significant findings.  Sharp curettage was performed on each uterine horn with only minimal tissue and bleeding present. No significant tissue was removed from either side.  Adequate hemostasis was present.  All instruments were removed from the vagina.  The patient was taken out of the dorsolithotomy position.  She was awakened, and she was moved from the operating table to the stretcher in stable condition.  Complications were none.  Estimated blood loss less than 5 cc. Sorbitol differential -50 cc. Dictated by:   Sherry A. Rosalio Macadamia, M.D. Attending Physician:  Morene Antu DD:  02/14/02 TD:  02/14/02 Job: [id number]

## 2011-03-05 NOTE — Discharge Summary (Signed)
[facility]  Patient:    Kara Harris                          MRN: [id number] Adm. Date:  [id number] Disc. Date: [id number] Attending:  Ephriam Knuckles H Dictator:   Grayland Jack, P.A.                           Discharge Summary  ADMITTING DIAGNOSES: 1. Infected right index finger secondary to cat bite. 2. Hypertension. 3. Hypercholesterolemia. 4. Overactive bladder.  DISCHARGE DIAGNOSES: 1. Status post irrigation and drainage infected right index finger. 2. Hypertension. 3. Hypercholesterolemia. 4. Overactive bladder.  CONSULTS:  None.  PROCEDURE:  Irrigation and drainage of right index finger under local anesthesia.  BRIEF HISTORY:  This 77 year old female was bitten by a stray cat on her right index finger.  The owners were unknown.  She was seen in the Urgent Care Center where she was given a tetanus shot and Augmentin to be taken by mouth. She noticed a significant increase in the redness, pain, and swelling.  She was brought to [facility] emergency department for further evaluation.  Dr. Amanda Pea was on call and asked to evaluate the patient.  He felt that she would best benefit from local debridement and admitted for IV antibiotics.  HOSPITAL COURSE:  May 08, 2000, the patient underwent the above-stated procedure under local anesthesia which she tolerated well without complications. She was admitted for IV antibiotics and was started on whirlpool treatments per physical therapy.  Cultures were sent; however, they either never made it to the lab or were accidentally destroyed in the lab. May 11, 2000, the patient was sitting on the edge of her bed eating breakfast.  She was without complaints of pain.  On exam, the wound was inspected and had dramatic improvement.  There was no erythema or discharge. There was some mild edema.  She was neurovascularly intact.  It was felt the patient was stable for discharge at this time.  LABORATORY  DATA:  On admission, CBC showed an elevated white blood cell at 15.3, hemoglobin 16.1, hematocrit 48.3.  RADIOLOGY:  X-rays of the right hand revealed soft tissue swelling of the proximal interphalangeal joint of the right index finger with some ______ changes at the distal interphalangeal joint.  CONDITION ON DISCHARGE:  Improved and stable.  DISCHARGE PLANS:  The patient is being discharged to home.  She may resume her home diet and home medications.  DISCHARGE MEDICATIONS:  Augmentin 875 mg p.o. b.i.d.  ACTIVITY:  No restrictions.  WOUND CARE:  She is to keep the wound clean and dry.  FOLLOW-UP:  She is to return to the clinic Friday for a wound check.  Please call [phone] for an appointment. DD:  05/30/00 TD:  05/31/00 Job: [id number]

## 2011-03-05 NOTE — Cardiovascular Report (Signed)
NAME:  Kara Harris, [place] [facility]                          ACCOUNT NO.:  [id number]   MEDICAL RECORD NO.:  [id number]                   PATIENT TYPE:  OIB   LOCATION:  2899                                 FACILITY:  [facility]   PHYSICIAN:  Nanetta Batty, M.D.                DATE OF BIRTH:  1922/12/13   DATE OF PROCEDURE:  07/26/2003  DATE OF DISCHARGE:                              CARDIAC CATHETERIZATION   INDICATIONS:  Ms. Olivier is a 77 year old moderately overweight white female  with history of PSVT and chest discomfort.  She had a Cardiolite which was  minimally positive for infarct with peri infarct ischemia.  She presents now  for diagnostic coronary arteriography.   PROCEDURE DESCRIPTION:  The patient was brought to the second floor [facility] in the postabsorptive state.  She  was premedicated with p.o. Valium.  Her right groin was prepped and shaved  in the usual sterile fashion.  1% Xylocaine was used for local anesthesia.  A 6-French sheath was inserted into the right femoral artery using standard  Seldinger technique.  6-French right and left Judkins diagnostic catheters  along with a 6-French pigtail catheter were used for selective coronary  angiography, left ventriculography, and distal abdominal aortography.  Omnipaque dye was used for the entirety of the case.  Retrograde aortic,  left ventricular, and pullback pressures were recorded.   HEMODYNAMICS:  1. Aortic systolic pressure 148, diastolic pressure 79.  2. Left ventricular systolic pressure 149, and diastolic pressure 49     (probably artifactual).   SELECTIVE CORONARY ANGIOGRAPHY:  1. Left main:  Normal.  2. LAD:  Normal.  3. Left circumflex:  Normal.  4. Ramus intermedius branch:  Normal.  5. Right coronary artery:  Dominant, normal.   LEFT VENTRICULOGRAPHY:  RAO left ventriculogram was performed using 25 mL of  Omnipaque dye at 12 mL/second.  The overall LV EF was estimated  greater than  60% without focal wall motion abnormalities.   DISTAL ABDOMINAL AORTOGRAPHY:  Distal abdominal aortogram was performed  using 20 mL of Omnipaque dye at 20 mL/second.  The renal arteries were  widely patent.  The infrarenal abdominal aorta and iliac bifurcation appear  free of significant atherosclerotic changes.   IMPRESSION:  Ms. Derocher has normal coronary arteries and normal left  ventricular function.  I believe her Cardiolite was falsely positive and  most likely artifactual.  Her chest pain was most likely noncardiac, though  chest  pain can be seen in the setting of PSVT.  The sheaths were removed and  pressure was held on the groin to achieve hemostasis.  The patient left the  laboratory in stable condition.  She will be discharged home later today as  an outpatient.  Will see me back in the office in two weeks.  Thora Lance, M.D. was notified of these results.  Nanetta Batty, M.D.    Cordelia Pen  D:  07/26/2003  T:  07/26/2003  Job:  [id number]   cc:   [facility]   Thora Lance, M.D.  [address]  [place]  [place] [address]  Fax: [phone]

## 2011-03-17 DIAGNOSIS — H3530 Unspecified macular degeneration: Secondary | ICD-10-CM

## 2011-03-17 DIAGNOSIS — G609 Hereditary and idiopathic neuropathy, unspecified: Secondary | ICD-10-CM

## 2011-03-17 HISTORY — DX: Hereditary and idiopathic neuropathy, unspecified: G60.9

## 2011-03-17 HISTORY — DX: Unspecified macular degeneration: H35.30

## 2011-07-12 ENCOUNTER — Other Ambulatory Visit: Payer: Self-pay | Admitting: Internal Medicine

## 2011-07-12 DIAGNOSIS — N644 Mastodynia: Secondary | ICD-10-CM

## 2011-07-15 LAB — CBC
Hemoglobin: 15.8 — ABNORMAL HIGH
Platelets: 268
RDW: 12.8
WBC: 9.6

## 2011-07-15 LAB — POCT I-STAT, CHEM 8
BUN: 18
Chloride: 108
Sodium: 136

## 2011-07-15 LAB — DIFFERENTIAL
Basophils Absolute: 0
Lymphocytes Relative: 21
Neutro Abs: 6.5

## 2011-07-15 LAB — POCT CARDIAC MARKERS
CKMB, poc: 2
Myoglobin, poc: 107
Operator id: 294501
Troponin i, poc: <0.05

## 2011-07-20 ENCOUNTER — Ambulatory Visit
Admission: RE | Admit: 2011-07-20 | Discharge: 2011-07-20 | Disposition: A | Discharge status: Home / Self Care | Payer: Medicare Other | Source: Ambulatory Visit | Attending: Internal Medicine | Admitting: Internal Medicine

## 2011-07-20 ENCOUNTER — Other Ambulatory Visit: Payer: Self-pay | Admitting: Internal Medicine

## 2011-07-20 DIAGNOSIS — N644 Mastodynia: Secondary | ICD-10-CM

## 2011-08-19 ENCOUNTER — Other Ambulatory Visit: Payer: Self-pay | Admitting: Internal Medicine

## 2011-08-19 DIAGNOSIS — Z1231 Encounter for screening mammogram for malignant neoplasm of breast: Secondary | ICD-10-CM

## 2011-08-20 ENCOUNTER — Other Ambulatory Visit: Payer: Self-pay | Admitting: Internal Medicine

## 2011-08-20 DIAGNOSIS — Z1231 Encounter for screening mammogram for malignant neoplasm of breast: Secondary | ICD-10-CM

## 2011-09-20 ENCOUNTER — Ambulatory Visit
Admission: RE | Admit: 2011-09-20 | Discharge: 2011-09-20 | Disposition: A | Discharge status: Home / Self Care | Payer: Medicare Other | Source: Ambulatory Visit | Attending: Internal Medicine | Admitting: Internal Medicine

## 2011-09-20 DIAGNOSIS — Z1231 Encounter for screening mammogram for malignant neoplasm of breast: Secondary | ICD-10-CM

## 2011-11-04 ENCOUNTER — Encounter (HOSPITAL_COMMUNITY): Payer: Self-pay | Admitting: *Deleted

## 2011-11-04 ENCOUNTER — Emergency Department (HOSPITAL_COMMUNITY)
Admission: EM | Admit: 2011-11-04 | Discharge: 2011-11-04 | Disposition: A | Discharge status: Home / Self Care | Payer: Medicare Other | Attending: Emergency Medicine | Admitting: Emergency Medicine

## 2011-11-04 DIAGNOSIS — S81009A Unspecified open wound, unspecified knee, initial encounter: Secondary | ICD-10-CM | POA: Insufficient documentation

## 2011-11-04 DIAGNOSIS — I10 Essential (primary) hypertension: Secondary | ICD-10-CM | POA: Insufficient documentation

## 2011-11-04 DIAGNOSIS — Z7982 Long term (current) use of aspirin: Secondary | ICD-10-CM | POA: Insufficient documentation

## 2011-11-04 DIAGNOSIS — S91009A Unspecified open wound, unspecified ankle, initial encounter: Secondary | ICD-10-CM | POA: Insufficient documentation

## 2011-11-04 DIAGNOSIS — W5501XA Bitten by cat, initial encounter: Secondary | ICD-10-CM

## 2011-11-04 DIAGNOSIS — IMO0001 Reserved for inherently not codable concepts without codable children: Secondary | ICD-10-CM | POA: Insufficient documentation

## 2011-11-04 DIAGNOSIS — F341 Dysthymic disorder: Secondary | ICD-10-CM | POA: Insufficient documentation

## 2011-11-04 DIAGNOSIS — Y92009 Unspecified place in unspecified non-institutional (private) residence as the place of occurrence of the external cause: Secondary | ICD-10-CM | POA: Insufficient documentation

## 2011-11-04 DIAGNOSIS — Z8673 Personal history of transient ischemic attack (TIA), and cerebral infarction without residual deficits: Secondary | ICD-10-CM | POA: Insufficient documentation

## 2011-11-04 DIAGNOSIS — E119 Type 2 diabetes mellitus without complications: Secondary | ICD-10-CM | POA: Insufficient documentation

## 2011-11-04 DIAGNOSIS — E785 Hyperlipidemia, unspecified: Secondary | ICD-10-CM | POA: Insufficient documentation

## 2011-11-04 DIAGNOSIS — Z79899 Other long term (current) drug therapy: Secondary | ICD-10-CM | POA: Insufficient documentation

## 2011-11-04 HISTORY — DX: Major depressive disorder, single episode, unspecified: F32.9

## 2011-11-04 HISTORY — DX: Vertebro-basilar artery syndrome: G45.0

## 2011-11-04 HISTORY — DX: Plantar fascial fibromatosis: M72.2

## 2011-11-04 HISTORY — DX: Insomnia, unspecified: G47.00

## 2011-11-04 HISTORY — DX: Venous insufficiency (chronic) (peripheral): I87.2

## 2011-11-04 HISTORY — DX: Transient cerebral ischemic attack, unspecified: G45.9

## 2011-11-04 HISTORY — DX: Polyneuropathy, unspecified: G62.9

## 2011-11-04 HISTORY — DX: Anxiety disorder, unspecified: F41.9

## 2011-11-04 HISTORY — DX: Depression, unspecified: F32.A

## 2011-11-04 HISTORY — DX: Hyperlipidemia, unspecified: E78.5

## 2011-11-04 HISTORY — DX: Supraventricular tachycardia: I47.1

## 2011-11-04 HISTORY — DX: Rickets, active: E55.0

## 2011-11-04 HISTORY — DX: Essential (primary) hypertension: I10

## 2011-11-04 HISTORY — DX: Other supraventricular tachycardia: I47.19

## 2011-11-04 NOTE — ED Notes (Signed)
D/c instructions reviewed w/ pt - pt denies any further questions or concerns at present.   

## 2011-11-04 NOTE — ED Provider Notes (Signed)
History     CSN: [id number]  Arrival date & time 11/04/11  1649   First MD Initiated Contact with Patient 11/04/11 1850      Chief Complaint  Patient presents with  . Animal Bite    (Consider location/radiation/quality/duration/timing/severity/associated sxs/prior treatment) Patient is a 78 y.o. female presenting with animal bite. The history is provided by the patient.  Animal Bite  The incident occurred today. The incident occurred at home. She came to the ER via personal transport. There is an injury to the right lower leg. The pain is mild. It is unlikely that a foreign body is present. Pertinent negatives include no chest pain, no numbness, no nausea, no vomiting, no headaches, no inability to bear weight, no pain when bearing weight, no weakness and no cough. There have been no prior injuries to these areas. Her tetanus status is UTD. She has been behaving normally. Recently, medical care has been given by the PCP. Services received include medications given.  Bite was pt own cat, all shots UTD.  Past Medical History  Diagnosis Date  . Hyperlipidemia   . Neuropathy   . Paroxysmal atrial tachycardia   . Diabetes mellitus   . Venous insufficiency   . Anxiety   . Depressive disorder   . Plantar fascial fibromatosis   . Insomnia   . Dysphagia   . TIA (transient ischemic attack)   . Rickets, active   . Hypertension   . Basilar artery syndrome     Past Surgical History  Procedure Date  . Hip fracture surgery   . Cholecystectomy     No family history on file.  History  Substance Use Topics  . Smoking status: Never Smoker   . Smokeless tobacco: Not on file  . Alcohol Use: No     Review of Systems  Respiratory: Negative for cough.   Cardiovascular: Negative for chest pain.  Gastrointestinal: Negative for nausea and vomiting.  Neurological: Negative for weakness, numbness and headaches.  10 systems reviewed and are negative for acute change except as noted in  the HPI.   Allergies  Codeine; Tramadol; and Vesicare  Home Medications   Current Outpatient Rx  Name Route Sig Dispense Refill  . ASPIRIN 325 MG PO TABS Oral Take 325 mg by mouth every 4 (four) hours as needed.    . ATENOLOL 25 MG PO TABS Oral Take 25 mg by mouth daily. TAKE 1/2 TABLET    . ATORVASTATIN CALCIUM 20 MG PO TABS Oral Take 20 mg by mouth at bedtime and may repeat dose one time if needed.    . CLOPIDOGREL BISULFATE 75 MG PO TABS Oral Take 75 mg by mouth daily.    Marland Kitchen CO-ENZYME Q-10 50 MG PO CAPS Oral Take 100 mg by mouth daily. TAKE 2 CAP(200MG )    . DIAZEPAM 5 MG PO TABS Oral Take 5 mg by mouth at bedtime as needed. TAKE 1/2 TABLET    . DIAZEPAM 5 MG PO TABS Oral Take 5 mg by mouth every 6 (six) hours as needed.    Marland Kitchen METFORMIN HCL 500 MG PO TABS Oral Take 500 mg by mouth 2 (two) times daily with a meal.    . POLYETHYLENE GLYCOL 3350 PO PACK Oral Take 17 g by mouth as needed.    Marland Kitchen SPIRONOLACTONE 50 MG PO TABS Oral Take 50 mg by mouth 2 (two) times daily.    . TOLTERODINE TARTRATE ER 2 MG PO CP24 Oral Take 2 mg by mouth 2 (  two) times daily.      BP 139/59  Pulse 82  Temp(Src) 98.1 F (36.7 C) (Oral)  Resp 18  SpO2 93%  Physical Exam  Nursing note and vitals reviewed. Constitutional: She appears well-developed and well-nourished. No distress.  HENT:  Head: Normocephalic and atraumatic.  Right Ear: External ear normal.  Left Ear: External ear normal.  Eyes: Conjunctivae are normal.  Neck: Normal range of motion. Neck supple.  Cardiovascular: Normal rate and regular rhythm.   Pulmonary/Chest: Effort normal. No respiratory distress.  Abdominal: Soft. She exhibits no distension. There is no tenderness.  Musculoskeletal: She exhibits no edema and no tenderness.  Neurological: She is alert.       Pt oriented to person, place, time. Does recall injury, but does not recall being treated at facility.  Skin: Skin is warm and dry.       3 3mm puncture wounds to lateral  lower left leg, no bleeding. Bandage with abx ointment in place.  Psychiatric: She has a normal mood and affect.    ED Course  Procedures (including critical care time)  Labs Reviewed - No data to display No results found.   1. Cat bite       MDM 7:00 PM Cat bite. Animal imm UTD. Pt Imm UTD. Pt has had augmentin at assisted living and has continued rx, per nursing staff. Advised that she would be d/c home. However, pt refuses to leave at this time. States she did not receive any antibiotic earlier today/does not recall receiving medication. Have tried to call [facility] but no answer. WIll continue to try to call.     8:00 PM Have spoken with a nurse a [facility] who states reporting nurse recalled breaking the augmentin pill in half and administering to patient. When I notified pt of this, she now recalls the events and is ready to be discharged home.        Elwyn Reach [place], [place] 11/04/11 2026

## 2011-11-04 NOTE — ED Notes (Signed)
Called [facility]. LPN responsible for pt sts order for Augmentin was written this am, and pt has received first dose.

## 2011-11-04 NOTE — ED Notes (Signed)
Pt states someone from [facility] is to pick her up shortly. Awaiting pt's ride. Pt given graham crackers and diet coke per pt request.

## 2011-11-04 NOTE — ED Provider Notes (Signed)
Medical screening examination/treatment/procedure(s) were performed by non-physician practitioner and as supervising physician I was immediately available for consultation/collaboration.  Tahjanae Blankenburg R. Maely Clements, MD 11/04/11 2308 

## 2011-11-04 NOTE — ED Notes (Signed)
Pt from [facility]. Brought here by their security. Sts her cat bit her this am on L lateral leg. Had abx ointment and bandaid applied by facility. Requested abx due to previous hospitalization r/t feral cat bite. Had order written by attending provider for augmentin but sts she has not received it yet. No redness noted around bandaid at site. Does not c/o pain.

## 2012-04-05 DIAGNOSIS — R151 Fecal smearing: Secondary | ICD-10-CM

## 2012-04-05 HISTORY — DX: Fecal smearing: R15.1

## 2012-08-14 DIAGNOSIS — I739 Peripheral vascular disease, unspecified: Secondary | ICD-10-CM

## 2012-08-14 HISTORY — DX: Peripheral vascular disease, unspecified: I73.9

## 2012-11-06 ENCOUNTER — Other Ambulatory Visit: Payer: Self-pay | Admitting: Internal Medicine

## 2012-11-06 DIAGNOSIS — Z1231 Encounter for screening mammogram for malignant neoplasm of breast: Secondary | ICD-10-CM

## 2012-11-06 LAB — HM MAMMOGRAPHY: HM Mammogram: NEGATIVE

## 2012-12-04 ENCOUNTER — Ambulatory Visit
Admission: RE | Admit: 2012-12-04 | Discharge: 2012-12-04 | Disposition: A | Discharge status: Home / Self Care | Payer: Medicare Other | Source: Ambulatory Visit | Attending: Internal Medicine | Admitting: Internal Medicine

## 2012-12-04 DIAGNOSIS — Z1231 Encounter for screening mammogram for malignant neoplasm of breast: Secondary | ICD-10-CM

## 2012-12-13 DIAGNOSIS — R197 Diarrhea, unspecified: Secondary | ICD-10-CM

## 2012-12-13 DIAGNOSIS — L2089 Other atopic dermatitis: Secondary | ICD-10-CM

## 2012-12-13 HISTORY — DX: Diarrhea, unspecified: R19.7

## 2012-12-13 HISTORY — DX: Other atopic dermatitis: L20.89

## 2013-02-12 ENCOUNTER — Non-Acute Institutional Stay: Payer: Medicare Other | Admitting: Internal Medicine

## 2013-02-12 VITALS — BP 118/62 | HR 80 | Wt 157.0 lb

## 2013-02-12 DIAGNOSIS — L209 Atopic dermatitis, unspecified: Secondary | ICD-10-CM | POA: Insufficient documentation

## 2013-02-12 DIAGNOSIS — E1159 Type 2 diabetes mellitus with other circulatory complications: Secondary | ICD-10-CM | POA: Insufficient documentation

## 2013-02-12 DIAGNOSIS — I10 Essential (primary) hypertension: Secondary | ICD-10-CM

## 2013-02-12 DIAGNOSIS — R32 Unspecified urinary incontinence: Secondary | ICD-10-CM | POA: Insufficient documentation

## 2013-02-12 DIAGNOSIS — R609 Edema, unspecified: Secondary | ICD-10-CM

## 2013-02-12 DIAGNOSIS — E119 Type 2 diabetes mellitus without complications: Secondary | ICD-10-CM

## 2013-02-12 DIAGNOSIS — E785 Hyperlipidemia, unspecified: Secondary | ICD-10-CM | POA: Insufficient documentation

## 2013-02-12 DIAGNOSIS — R21 Rash and other nonspecific skin eruption: Secondary | ICD-10-CM

## 2013-02-12 DIAGNOSIS — L2089 Other atopic dermatitis: Secondary | ICD-10-CM

## 2013-02-12 HISTORY — DX: Type 2 diabetes mellitus with other circulatory complications: E11.59

## 2013-02-12 NOTE — Patient Instructions (Signed)
Continue current medications. We will help arrange referrals to another cardiologist and urologist when she desires. She wants to think about the cardiology. We offered to arrange an appointment with Dr. Logan Bores, who she has seen in the past.

## 2013-02-12 NOTE — Progress Notes (Signed)
  Subjective:    Patient ID: Kara Harris, female    DOB: 1923-09-16, 79 y.o.   MRN: [id number]  HPI Has issues with a relative. Rash in the groin. Seeing dermatologist, Dr. Ethelene Hal at [facility] for various rashes. Chronic edema related to venous insufficiency. Compression stockings did not seem to help. She says she would like to see a different cardiologist and urologist. Has been seeing Dr. Allyson Sabal and Dr. Sherron Monday.   Review of Systems  Constitutional: Negative for fever, chills, activity change, appetite change and fatigue.  HENT: Negative.   Eyes: Negative.   Respiratory: Negative.   Cardiovascular: Negative.   Gastrointestinal: Negative.   Endocrine: Negative for cold intolerance, heat intolerance, polydipsia, polyphagia and polyuria.       Hx DM  Genitourinary:       Incontinence   Musculoskeletal: Positive for back pain, arthralgias and gait problem. Negative for myalgias and joint swelling.       Uses walker  Skin: Negative.   Allergic/Immunologic: Negative.   Neurological: Negative for tremors, seizures, syncope, facial asymmetry, speech difficulty, weakness, light-headedness and numbness.  Hematological: Negative.   Psychiatric/Behavioral: Positive for dysphoric mood. Negative for hallucinations, behavioral problems, confusion, self-injury, decreased concentration and agitation. The patient is not nervous/anxious and is not hyperactive.        Objective:   Physical Exam  Constitutional: She is oriented to person, place, and time. No distress.  overweight  HENT:  Head: Normocephalic and atraumatic.  Mouth/Throat: No oropharyngeal exudate.  Eyes: Pupils are equal, round, and reactive to light.  Neck: Normal range of motion. Neck supple. No JVD present. No tracheal deviation present. No thyromegaly present.  Cardiovascular: Normal rate, regular rhythm, normal heart sounds and intact distal pulses.  Exam reveals no gallop and no friction rub.   No  murmur heard. Pulmonary/Chest: Breath sounds normal. No respiratory distress. She has no wheezes. She has no rales.  Abdominal: She exhibits no distension and no mass. There is no tenderness.  Musculoskeletal: She exhibits edema. She exhibits no tenderness.  Lymphadenopathy:    She has no cervical adenopathy.  Neurological: She is oriented to person, place, and time. She has normal reflexes. No cranial nerve deficit. Coordination normal.  Skin: Skin is warm and dry. No rash noted. She is not diaphoretic. No pallor.  Psychiatric: Her behavior is normal. Judgment and thought content normal.  Depressed acting. Frustrated with her life.      Lab reports 11/23/12 CMP:; normal except glu 111  Lipids; TC 108, trig 132,, HDL 37, LDL 45 ---atorvastatin was stopped.  A1c 6.7    Assessment & Plan:  Type II or unspecified type diabetes mellitus without mention of complication, not stated as uncontrolled  Under good control. No change in medications. Hyperlipidemia  Needs laboratory followup next visit Rash and other nonspecific skin eruption  Continue her dermatology appointments Edema  Mild. Continue spironolactone. Unspecified urinary incontinence  Expresses unhappiness with her current urologist. She would like a referral in the future to a different urologist. Essential hypertension, benign  Controlled

## 2013-08-15 ENCOUNTER — Encounter: Payer: Self-pay | Admitting: Geriatric Medicine

## 2013-08-29 ENCOUNTER — Encounter: Payer: Self-pay | Admitting: Geriatric Medicine

## 2013-08-29 ENCOUNTER — Non-Acute Institutional Stay: Payer: Medicare Other | Admitting: Geriatric Medicine

## 2013-08-29 VITALS — BP 104/60 | HR 80 | Temp 98.3°F | Ht 65.0 in | Wt 157.0 lb

## 2013-08-29 DIAGNOSIS — E119 Type 2 diabetes mellitus without complications: Secondary | ICD-10-CM

## 2013-08-29 DIAGNOSIS — L409 Psoriasis, unspecified: Secondary | ICD-10-CM

## 2013-08-29 DIAGNOSIS — Z299 Encounter for prophylactic measures, unspecified: Secondary | ICD-10-CM

## 2013-08-29 DIAGNOSIS — R32 Unspecified urinary incontinence: Secondary | ICD-10-CM

## 2013-08-29 DIAGNOSIS — R269 Unspecified abnormalities of gait and mobility: Secondary | ICD-10-CM

## 2013-08-29 DIAGNOSIS — G4700 Insomnia, unspecified: Secondary | ICD-10-CM

## 2013-08-29 DIAGNOSIS — I872 Venous insufficiency (chronic) (peripheral): Secondary | ICD-10-CM

## 2013-08-29 DIAGNOSIS — L408 Other psoriasis: Secondary | ICD-10-CM

## 2013-08-29 DIAGNOSIS — E785 Hyperlipidemia, unspecified: Secondary | ICD-10-CM

## 2013-08-29 DIAGNOSIS — I10 Essential (primary) hypertension: Secondary | ICD-10-CM

## 2013-08-29 DIAGNOSIS — I739 Peripheral vascular disease, unspecified: Secondary | ICD-10-CM

## 2013-08-29 NOTE — Assessment & Plan Note (Signed)
Well controlled with current medication. Update lab

## 2013-08-29 NOTE — Assessment & Plan Note (Signed)
Recent fasting CBGs 123-145, continues metformin. Update lab

## 2013-08-29 NOTE — Progress Notes (Signed)
Patient ID: Garth Bigness, female   DOB: 05-13-1923, 79 y.o.   MRN: [id number] [facility] [phone])  Code Status: DNR  Contact Information   Name Relation Home Work Mobile   [facility]  [phone]     Bryan,Joan & Marguerita Beards [phone]         Chief Complaint  Patient presents with  . Medical Managment of Chronic Issues    Comprehensive Exam: blood sugar, blood pressure, depression    HPI: This is a 79 y.o. female resident of [facility] section. This patient has not had a hospitalization, serious acute illness or injury in the last year. Review of facility record shows this patient is blood pressure has been satisfactory range, weight has been stable, fasting blood sugars satisfactory. Patient continues to be unhappy in Assisted Living setting however she has been attending and enjoying [employer] 3 times a week, continues to take meals in the dining room.  Patient has been seen several times this year by Dr. Guss Bunde regarding venous insufficiency. Venous Doppler study was negative for DVT, his recommendation has been compression hose for mild LE edema. Patient does not wear this hose because she feels it makes her legs worse.  Peripheral vascular disease was last evaluated by Dr. Bobette Mo April 2014, no changes recommended.  The patient has had major dental work over the last few months, work is now complete. She denies any mouth or tooth pain, is eating well.  Patient has been evaluated by Dr. Ethelene Hal and treated for psoriasis with triamcinolone cream. Groin rash has improved.  Dr. Dagoberto Ligas continues to see patient at intervals, she does have some cataracts do not require intervention at this time. Patient has mild blepharitis under control this time. Patient has no diabetic retinopathy. Next evaluation will be January 2015  Urinary urge incontinence is unchanged, by Luisa Hart was not helpful, patient  stopped that medication. She does continue to take Detrol. The patient reports she feels this medicine is not helpful at all. She would like to return to urologist at [facility] for repeat evaluation.  Functional status Bathing: Moderate Assist Bladder Management: Pads, Bowel Management: Continent Diet / Swallowing: PO Diet: Regular Hygiene and Grooming: Independent, Toileting / Clothing: Independent Walk: Independent w/ walker    Allergies  Allergen Reactions  . Codeine     UNKNOWN  . Tramadol     UNKNOWN  . Vesicare [Solifenacin Succinate]     UNKNOWN      Medication List       This list is accurate as of: 08/29/13 11:59 PM.  Always use your most recent med list.               atenolol 25 MG tablet  Commonly known as:  TENORMIN  Take 25 mg by mouth daily. TAKE 1/2 TABLET     calcium-vitamin D 500-200 MG-UNIT per tablet  Commonly known as:  OSCAL WITH D  Take 1 tablet by mouth. Daily in morning     chlorhexidine 0.12 % solution  Commonly known as:  PERIDEX  Use as directed 15 mLs in the mouth or throat. Use as directed by Dr. Alvina Filbert     clobetasol cream 0.05 %  Commonly known as:  TEMOVATE  Apply 1 application topically 2 (two) times daily. To red and scaly areas     clopidogrel 75 MG tablet  Commonly known as:  PLAVIX  Take 75 mg by mouth daily with breakfast.  Erythromycin 2 % ointment  Apply 2 % topically 2 (two) times daily. Apply to inguinal folds twice daily for 14 days     ibuprofen 800 MG tablet  Commonly known as:  ADVIL,MOTRIN  Take 800 mg by mouth every 6 (six) hours as needed. For pain     loperamide 2 MG tablet  Commonly known as:  IMODIUM A-D  Take 2 mg by mouth. 1/2 to 1 tablet daily as needed for diarrhea     Melatonin 3 MG Caps  Take by mouth. Take one tablet at bedtime     metFORMIN 500 MG tablet  Commonly known as:  GLUCOPHAGE  Take 500 mg by mouth 2 (two) times daily with a meal.     polyethylene glycol packet  Commonly  known as:  MIRALAX / GLYCOLAX  Take 17 g by mouth as needed.     spironolactone 50 MG tablet  Commonly known as:  ALDACTONE  Take 50 mg by mouth 2 (two) times daily.     triamcinolone cream 0.1 %  Commonly known as:  KENALOG  Apply 1 application topically. Apply thin layer twice daily as needed for groin rash for no longer than 2 weeks at a time. Apply as needed for redness and itching        DATA REVIEWED  Radiologic Exams  [facility] List 2006- - Colonoscopy: nl exam 04/05/08 CT brain: normal 11/15/08 CT LS: Moderate multifactorial spinal/foraminal stenosis at L3-4, L4-5 - could cause neural compression. Foraminal narrowing on left at L5-S1- could affect L5 nerve root 08/2009: Mammogram- nl. exam 12/2009: MRA head, no contrast  CT brain, no contrast  CXR: Lungs well aerated. No focal opacities, effusion or pntx.  09/17/2010 Mammogram Normal  10/2010: Urodynamic study (at [facility]): confirmed voiding dysfunction 12/2010 Upper Endoscopy: slight gastritis  Colonoscopy: essentially normal exam 07/20/2011 [place] Left breast negative 09/20/2011 Mammogram negative    Cardiovascular Exams  [facility] List 2004 - cardiac cath: EF 60%. Normal coronary arteries 04/26/08 2D Echo: Normal LV function/size. Marland Kitchen No embolic source, no ASD.  07/18/08 EKG: SR 74BPM. Left axis deviation. Evidence of old inferior infarct 06/2009: EKG: Sinus rhythm, HR 68BPM. Borderline LVH, poor 'r' wave progression c/w old infarct 10/22/09 Doppler of legs: No DVT. Left knee effusion. 02/2010: 2D echocardiogram: LVEF 65-70%. Abnormal LV relaxation c/w  grade 1 diastolic dysfunction. AoV with mild regurgitation   Laboratory Studies   [facility] 08/03/2012 CBC: Wbc 11.3, Rbc 4.60, Hgb 14.2, Hct 41.5, Platelet 296  HgbA1c 6.7   11/2012:  Glucose 111, the BUN 11, creatinine 0.78, sodium 136, potassium 4.3.Protein/LFTs WNL   total cholesterol 108, triglycerides 132, HDL 37, LDL 45   A1c 6.7   Past Medical History  Diagnosis Date  .  Hyperlipidemia   . Neuropathy   . Paroxysmal atrial tachycardia   . Diabetes mellitus   . Venous insufficiency   . Anxiety   . Depressive disorder   . Plantar fascial fibromatosis   . Insomnia   . Dysphagia   . TIA (transient ischemic attack)   . Rickets, active   . Hypertension   . Basilar artery syndrome   . Other atopic dermatitis and related conditions 12/13/2012  . Diarrhea 12/13/2012  . Peripheral vascular disease, unspecified 08/14/2012  . Fecal smearing 04/05/2012  . Unspecified hereditary and idiopathic peripheral neuropathy 03/17/2011  . Macular degeneration (senile) of retina, unspecified 03/17/2011  . Rash and other nonspecific skin eruption 12/21/2010  . Closed fracture of intertrochanteric section of femur  03/04/2010  . Spinal stenosis, unspecified region other than cervical 11/18/2008  . Pain in limb 11/18/2008  . Abnormality of gait 11/18/2008  . Pain in joint, pelvic region and thigh 10/2008  . Pain in joint, ankle and foot 08/07/2008  . Unspecified urinary incontinence 05/13/2008  . Corns and callosities 02/05/2008  . Vitamin D deficiency 11/07/2007  . Edema 10/25/2007  . Transient ischemic attack (TIA), and cerebral infarction without residual deficits(V12.54) 10/28/2002   Past Surgical History  Procedure Laterality Date  . Hip fracture surgery Right 03/01/2010  . Cholecystectomy  1964  . Tonsillectomy and adenoidectomy      as a child  . Breast mass excision    . Incision / drainage hand / finger  2001    cat bite  . Dilation and curettage of uterus  2002-03-01    post menopausal bleeding. Bicornate uterus/doble cervix, benign path  . Colonoscopy  12/18/2010    Dr Randa Evens  . Esophagogastroduodenoscopy endoscopy  12/18/2010    Dr. Randa Evens slight gastritis   Family Status  Relation Status Death Age  . Brother Deceased     complications re: DM, 03-02-11  . Mother Deceased 59    cerebral hemorrhage  . Father Deceased 51    multiple causes   History   Social History Narrative    Patient is Widowed. No children. 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 Groin rash is improved EYES: No eye pain, dryness or itching  No change in vision EARS: No earache, tinnitus, change in hearing NOSE: No congestion, drainage or bleeding MOUTH/THROAT: No mouth or tooth pain    No sore throat   No difficulty chewing or swallowing Recent dental work RESPIRATORY: No cough, wheezing, SOB CARDIAC: No chest pain, palpitations  Feet swell CHEST/BREASTS: No discomfort, discharge or lumps in breasts GI: No abdominal pain    No N/V/D or constipation   No heartburn or reflux  GU: No dysuria, Chronic frequency, urgency , leakage  No change in urine volume or character MUSCULOSKELETAL: No joint pain, swelling or stiffness  No back pain  No muscle ache, pain, weakness  Gait is steady   No recent falls.  NEUROLOGIC: No dizziness, fainting, headache, has occasional tingling in her toes No change in mental status.  PSYCHIATRIC: No feelings of anxiety, depression is unchanged. Sleeps okay until 5 or 6 AM, takes melatonin and sleeps till 8 or 9 AM awakes rested.   Physical Exam Filed Vitals:   08/29/13 1015  BP: 104/60  Pulse: 80  Temp: 98.3 F (36.8 C)  TempSrc: Oral  Height: 5\' 5"  (1.651 m)  Weight: 157 lb (71.215 kg)   Body mass index is 26.13 kg/(m^2).  GENERAL APPEARANCE: No acute distress, appropriately groomed, normal body habitus. Alert, pleasant, conversant. SKIN: No diaphoresis,  unusual lesions, wounds.  Fading red, scaly rash left groin HEAD: Normocephalic, atraumatic EYES: Conjunctiva/lids clear. Pupils round, reactive. EOMs intact.  EARS: External exam WNL, canals  clear, TM WNL. Hearing decreased. NOSE: No deformity or discharge. MOUTH/THROAT: Lips w/o lesions. Oral mucosa, tongue moist, w/o lesion. Oropharynx w/o redness or lesions.  NECK: Supple, full ROM. No thyroid  tenderness, enlargement or nodule LYMPHATICS: No head, neck or supraclavicular adenopathy RESPIRATORY: Breathing is even, unlabored. Lung sounds are clear and full.  CHEST/BREASTS: No chest deformity. Breasts without tenderness, mass, discharge CARDIOVASCULAR: Heart RRR. No murmur or extra heart sounds  ARTERIAL: No carotid bruit. Carotid 2+, Distal pulses absent.   VENOUS: Superificial varicosities bilateral LE. No venous stasis skin changes  EDEMA: 1+ peripheral edema (feet). No ascites GASTROINTESTINAL: Abdomen is obese, soft, non-tender, not distended w/ normal bowel sounds. No hepatic or splenic enlargement. No mass, ventral or inguinal hernia. GENITOURINARY: Bladder non tender, not distended. MUSCULOSKELETAL: Moves all extremities with full ROM, strength and tone. Back with kyphosis, No scoliosis or spinal process tenderness. Gait is unsteady, better with walker NEUROLOGIC: Oriented to time, place, person. Cranial nerves 2-12 grossly intact, speech clear, no tremor. Patella, brachial DTR 1+. PSYCHIATRIC: Mood and affect appropriate to situation  ASSESSMENT/PLAN  Essential hypertension, benign Well controlled with current medication. Update lab  Type II or unspecified type diabetes mellitus with peripheral circulatory disorders, uncontrolled(250.72) Recent fasting CBGs 123-145, continues metformin. Update lab  Psoriasis Groin rash improved. Pt. Applies triamcinolone when flare occurs  Other and unspecified hyperlipidemia Most recent lipid panel normal, no current medication. Update lab  Venous insufficiency Superficial varicosities bilateral LE, bilateral pedal edema, unchanged. Not wearing compression hose "makes my legs worse". Encourage gentle exercise/  ambulation  Peripheral vascular disease, unspecified No palpable distal pulses, no rest pain or claudication. Skin is intact. Encourage daily walking program. Update lab (lipids)  Insomnia SOme delay in sleep initiation, sleeps until 5AM, then takes melatonin, sleeps til 8-9am. Awakes refreshed  Abnormality of gait Unsteady gait, longstanding, multifactorial. Ambulates safely with walker, no recent fall.  Unspecified urinary incontinence Stopped Myrbetriq 10/2012, has been taking Detrol for some time. Continues with frequency, dribbling, recently notes difficulty initiating urine flow. May be experiencing urinary retention, stop medication.  Patient is considering returning to Urologist at [facility]- she will make arrangments   Follow up: 3 months Dr. Chilton Si, 6 mo CTKrell, NP-C  Lab 08/30/13  CBC, CMP, Lipid, A1C  Andrew Soria T.Marguerite Jarboe, NP-C 08/29/2013

## 2013-08-29 NOTE — Assessment & Plan Note (Signed)
No palpable distal pulses, no rest pain or claudication. Skin is intact. Encourage daily walking program. Update lab (lipids)

## 2013-08-29 NOTE — Assessment & Plan Note (Signed)
Unsteady gait, longstanding, multifactorial. Ambulates safely with walker, no recent fall.

## 2013-08-29 NOTE — Assessment & Plan Note (Signed)
Groin rash improved. Pt. Applies triamcinolone when flare occurs

## 2013-08-29 NOTE — Assessment & Plan Note (Signed)
SOme delay in sleep initiation, sleeps until 5AM, then takes melatonin, sleeps til 8-9am. Awakes refreshed

## 2013-08-29 NOTE — Assessment & Plan Note (Signed)
Superficial varicosities bilateral LE, bilateral pedal edema, unchanged. Not wearing compression hose "makes my legs worse". Encourage gentle exercise/ ambulation

## 2013-08-29 NOTE — Assessment & Plan Note (Signed)
Most recent lipid panel normal, no current medication. Update lab

## 2013-08-29 NOTE — Assessment & Plan Note (Addendum)
Stopped Myrbetriq 10/2012, has been taking Detrol for some time. Continues with frequency, dribbling, recently notes difficulty initiating urine flow. May be experiencing urinary retention, stop medication.  Patient is considering returning to Urologist at [facility]- she will make arrangments

## 2013-08-30 LAB — CBC AND DIFFERENTIAL
HEMATOCRIT: 42 % (ref 36–46)
Hemoglobin: 14.3 g/dL (ref 12.0–16.0)
Platelets: 326 10*3/uL (ref 150–399)
WBC: 9.6 10^3/mL

## 2013-08-30 LAB — HEPATIC FUNCTION PANEL
ALT: 29 U/L (ref 7–35)
AST: 23 U/L (ref 13–35)
Alkaline Phosphatase: 61 U/L (ref 25–125)

## 2013-08-30 LAB — LIPID PANEL
Cholesterol: 194 mg/dL (ref 0–200)
HDL: 45 mg/dL (ref 35–70)
LDL CALC: 122 mg/dL
Triglycerides: 135 mg/dL (ref 40–160)

## 2013-08-30 LAB — BASIC METABOLIC PANEL
BUN: 11 mg/dL (ref 4–21)
Creatinine: 0.8 mg/dL (ref 0.5–1.1)
GLUCOSE: 116 mg/dL
Potassium: 4.3 mmol/L (ref 3.4–5.3)
Sodium: 138 mmol/L (ref 137–147)

## 2013-08-30 LAB — TSH: TSH: 1.73 u[IU]/mL (ref 0.41–5.90)

## 2013-08-30 LAB — HEMOGLOBIN A1C: Hgb A1c MFr Bld: 6.1 % — AB (ref 4.0–6.0)

## 2013-09-01 ENCOUNTER — Encounter: Payer: Self-pay | Admitting: Geriatric Medicine

## 2013-11-14 ENCOUNTER — Encounter (HOSPITAL_COMMUNITY): Payer: Self-pay | Admitting: Emergency Medicine

## 2013-11-14 ENCOUNTER — Observation Stay (HOSPITAL_COMMUNITY)
Admission: EM | Admit: 2013-11-14 | Discharge: 2013-11-15 | Disposition: A | Discharge status: Nursing Home (Includes ICF) | Payer: Medicare Other | Attending: Internal Medicine | Admitting: Internal Medicine

## 2013-11-14 DIAGNOSIS — E119 Type 2 diabetes mellitus without complications: Secondary | ICD-10-CM

## 2013-11-14 DIAGNOSIS — R404 Transient alteration of awareness: Secondary | ICD-10-CM | POA: Insufficient documentation

## 2013-11-14 DIAGNOSIS — E785 Hyperlipidemia, unspecified: Secondary | ICD-10-CM

## 2013-11-14 DIAGNOSIS — R21 Rash and other nonspecific skin eruption: Secondary | ICD-10-CM

## 2013-11-14 DIAGNOSIS — K922 Gastrointestinal hemorrhage, unspecified: Secondary | ICD-10-CM

## 2013-11-14 DIAGNOSIS — L2089 Other atopic dermatitis: Secondary | ICD-10-CM

## 2013-11-14 DIAGNOSIS — E1159 Type 2 diabetes mellitus with other circulatory complications: Secondary | ICD-10-CM

## 2013-11-14 DIAGNOSIS — L409 Psoriasis, unspecified: Secondary | ICD-10-CM

## 2013-11-14 DIAGNOSIS — D62 Acute posthemorrhagic anemia: Secondary | ICD-10-CM

## 2013-11-14 DIAGNOSIS — R269 Unspecified abnormalities of gait and mobility: Secondary | ICD-10-CM

## 2013-11-14 DIAGNOSIS — K589 Irritable bowel syndrome without diarrhea: Secondary | ICD-10-CM | POA: Diagnosis present

## 2013-11-14 DIAGNOSIS — Z8679 Personal history of other diseases of the circulatory system: Secondary | ICD-10-CM

## 2013-11-14 DIAGNOSIS — D72829 Elevated white blood cell count, unspecified: Secondary | ICD-10-CM

## 2013-11-14 DIAGNOSIS — I739 Peripheral vascular disease, unspecified: Secondary | ICD-10-CM | POA: Insufficient documentation

## 2013-11-14 DIAGNOSIS — R609 Edema, unspecified: Secondary | ICD-10-CM

## 2013-11-14 DIAGNOSIS — K921 Melena: Principal | ICD-10-CM | POA: Diagnosis present

## 2013-11-14 DIAGNOSIS — Z8673 Personal history of transient ischemic attack (TIA), and cerebral infarction without residual deficits: Secondary | ICD-10-CM | POA: Insufficient documentation

## 2013-11-14 DIAGNOSIS — I10 Essential (primary) hypertension: Secondary | ICD-10-CM

## 2013-11-14 DIAGNOSIS — I872 Venous insufficiency (chronic) (peripheral): Secondary | ICD-10-CM

## 2013-11-14 DIAGNOSIS — G4700 Insomnia, unspecified: Secondary | ICD-10-CM

## 2013-11-14 DIAGNOSIS — R32 Unspecified urinary incontinence: Secondary | ICD-10-CM

## 2013-11-14 LAB — CBC
HEMATOCRIT: 37.9 % (ref 36.0–46.0)
Hemoglobin: 12.7 g/dL (ref 12.0–15.0)
MCH: 31.9 pg (ref 26.0–34.0)
MCHC: 33.5 g/dL (ref 30.0–36.0)
MCV: 95.2 fL (ref 78.0–100.0)
PLATELETS: 364 10*3/uL (ref 150–400)
RBC: 3.98 MIL/uL (ref 3.87–5.11)
RDW: 13.2 % (ref 11.5–15.5)
WBC: 13.8 10*3/uL — ABNORMAL HIGH (ref 4.0–10.5)

## 2013-11-14 LAB — URINALYSIS, ROUTINE W REFLEX MICROSCOPIC
Bilirubin Urine: NEGATIVE
Glucose, UA: NEGATIVE mg/dL
KETONES UR: NEGATIVE mg/dL
NITRITE: NEGATIVE
PH: 7 (ref 5.0–8.0)
Protein, ur: NEGATIVE mg/dL
Specific Gravity, Urine: 1.008 (ref 1.005–1.030)
UROBILINOGEN UA: 0.2 mg/dL (ref 0.0–1.0)

## 2013-11-14 LAB — COMPREHENSIVE METABOLIC PANEL
ALBUMIN: 3.7 g/dL (ref 3.5–5.2)
ALT: 27 U/L (ref 0–35)
AST: 22 U/L (ref 0–37)
Alkaline Phosphatase: 62 U/L (ref 39–117)
BILIRUBIN TOTAL: 0.5 mg/dL (ref 0.3–1.2)
BUN: 31 mg/dL — AB (ref 6–23)
CALCIUM: 9.4 mg/dL (ref 8.4–10.5)
CO2: 22 mEq/L (ref 19–32)
CREATININE: 0.9 mg/dL (ref 0.50–1.10)
Chloride: 100 mEq/L (ref 96–112)
GFR calc Af Amer: 64 mL/min — ABNORMAL LOW (ref >90)
GFR calc non Af Amer: 55 mL/min — ABNORMAL LOW (ref >90)
Glucose, Bld: 141 mg/dL — ABNORMAL HIGH (ref 70–99)
Potassium: 4.7 mEq/L (ref 3.7–5.3)
Sodium: 136 mEq/L — ABNORMAL LOW (ref 137–147)
TOTAL PROTEIN: 7 g/dL (ref 6.0–8.3)

## 2013-11-14 LAB — URINE MICROSCOPIC-ADD ON

## 2013-11-14 LAB — TYPE AND SCREEN
ABO/RH(D): O POS
ANTIBODY SCREEN: NEGATIVE

## 2013-11-14 LAB — PROTIME-INR
INR: 1.09 (ref 0.00–1.49)
Prothrombin Time: 13.9 seconds (ref 11.6–15.2)

## 2013-11-14 LAB — LIPASE, BLOOD: LIPASE: 23 U/L (ref 11–59)

## 2013-11-14 MED ORDER — INSULIN ASPART 100 UNIT/ML ~~LOC~~ SOLN
0.0000 [IU] | Freq: Three times a day (TID) | SUBCUTANEOUS | Status: DC
  Administered 2013-11-15: 1 [IU] via SUBCUTANEOUS
  Administered 2013-11-15: 2 [IU] via SUBCUTANEOUS

## 2013-11-14 MED ORDER — SODIUM CHLORIDE 0.9 % IV SOLN
80.0000 mg | Freq: Once | INTRAVENOUS | Status: AC
  Administered 2013-11-14: 80 mg via INTRAVENOUS
  Filled 2013-11-14: qty 80

## 2013-11-14 MED ORDER — ACETAMINOPHEN 650 MG RE SUPP: 650.0000 mg | Freq: Four times a day (QID) | RECTAL | Status: DC | PRN

## 2013-11-14 MED ORDER — SODIUM CHLORIDE 0.9 % IJ SOLN: 3.0000 mL | Freq: Two times a day (BID) | INTRAMUSCULAR | Status: DC

## 2013-11-14 MED ORDER — SODIUM FLUORIDE 1.1 % DT PSTE: 1.0000 "application " | PASTE | Freq: Every day | DENTAL | Status: DC

## 2013-11-14 MED ORDER — ONDANSETRON HCL 4 MG PO TABS: 4.0000 mg | ORAL_TABLET | Freq: Four times a day (QID) | ORAL | Status: DC | PRN

## 2013-11-14 MED ORDER — ACETAMINOPHEN 325 MG PO TABS: 650.0000 mg | ORAL_TABLET | Freq: Four times a day (QID) | ORAL | Status: DC | PRN

## 2013-11-14 MED ORDER — SODIUM CHLORIDE 0.9 % IV SOLN
8.0000 mg/h | INTRAVENOUS | Status: DC
  Administered 2013-11-14 – 2013-11-15 (×2): 8 mg/h via INTRAVENOUS
  Filled 2013-11-14 (×4): qty 80

## 2013-11-14 MED ORDER — SODIUM CHLORIDE 0.9 % IV SOLN
INTRAVENOUS | Status: DC
  Administered 2013-11-15 (×2): via INTRAVENOUS

## 2013-11-14 MED ORDER — ONDANSETRON HCL 4 MG/2ML IJ SOLN: 4.0000 mg | Freq: Four times a day (QID) | INTRAMUSCULAR | Status: DC | PRN

## 2013-11-14 MED ORDER — PANTOPRAZOLE SODIUM 40 MG IV SOLR: 40.0000 mg | Freq: Two times a day (BID) | INTRAVENOUS | Status: DC

## 2013-11-14 MED ORDER — CLOBETASOL PROPIONATE 0.05 % EX CREA
1.0000 "application " | TOPICAL_CREAM | Freq: Two times a day (BID) | CUTANEOUS | Status: DC
  Filled 2013-11-14: qty 15

## 2013-11-14 MED ORDER — LABETALOL HCL 5 MG/ML IV SOLN
5.0000 mg | INTRAVENOUS | Status: DC | PRN
  Filled 2013-11-14: qty 4

## 2013-11-14 NOTE — ED Notes (Signed)
EKG given to EDP, Harrison,MD. For review.

## 2013-11-14 NOTE — ED Notes (Signed)
Brought in by EMS from [facility] with c/o black tarry stool, onset today.  Per EMS report, pt has had one episode of melena today; pt denies abdominal pain or discomfort, no nausea or vomiting; pt denies dizziness.  BP133/63, HR 90, RR18,  O2 sat 97% on room air.

## 2013-11-14 NOTE — H&P (Signed)
[facility] History and Physical  Mykal Kirchman Thome WFU:[id number] DOB: 1923/07/02 DOA: 11/14/2013  Referring physician: ER physician. PCP: Estill Dooms, MD  Specialists: Dr. Oletta Lamas. Gastroenterologist.  Chief Complaint: Rectal bleeding.  HPI: Kara Harris is a 80 y.o. female with history of hypertension, hyperlipidemia, peripheral vascular disease, history of TIA and diabetes mellitus started experiencing rectal bleeding since afternoon today. Patient has had at least 3-4 bowel movements before she came to the ER. In the ER patient had a bowel movement which was dark in color. ER physician mentions it as almost black in color. Stool for occult was positive. Patient otherwise was hemodynamically stable. Patient states that over the last few days she has been taking ibuprofen for back pain. Patient otherwise denies any nausea vomiting abdominal pain diarrhea. Denies having used any recent antibiotics. Denies any chest pain or shortness of breath. Patient has been started on Protonix infusion and admitted for further workup.   Review of Systems: As presented in the history of presenting illness, rest negative.  Past Medical History  Diagnosis Date  . Hyperlipidemia   . Neuropathy   . Paroxysmal atrial tachycardia   . Diabetes mellitus   . Venous insufficiency   . Anxiety   . Depressive disorder   . Plantar fascial fibromatosis   . Insomnia   . Dysphagia   . TIA (transient ischemic attack)   . Rickets, active   . Hypertension   . Basilar artery syndrome   . Other atopic dermatitis and related conditions 12/13/2012  . Diarrhea 12/13/2012  . Peripheral vascular disease, unspecified 08/14/2012  . Fecal smearing 04/05/2012  . Unspecified hereditary and idiopathic peripheral neuropathy 03/17/2011  . Macular degeneration (senile) of retina, unspecified 03/17/2011  . Rash and other nonspecific skin eruption 12/21/2010  . Closed fracture of intertrochanteric section of femur 03/04/2010  .  Spinal stenosis, unspecified region other than cervical 11/18/2008  . Pain in limb 11/18/2008  . Abnormality of gait 11/18/2008  . Pain in joint, pelvic region and thigh 10/2008  . Pain in joint, ankle and foot 08/07/2008  . Unspecified urinary incontinence 05/13/2008  . Corns and callosities 02/05/2008  . Vitamin D deficiency 11/07/2007  . Edema 10/25/2007  . Transient ischemic attack (TIA), and cerebral infarction without residual deficits(V12.54) 10/28/2002  . Type II or unspecified type diabetes mellitus with peripheral circulatory disorders, uncontrolled(250.72) 02/12/2013   Past Surgical History  Procedure Laterality Date  . Hip fracture surgery Right 02/2010  . Cholecystectomy  1964  . Tonsillectomy and adenoidectomy      as a child  . Breast mass excision    . Incision / drainage hand / finger  2001    cat bite  . Dilation and curettage of uterus  2003    post menopausal bleeding. Bicornate uterus/doble cervix, benign path  . Colonoscopy  12/18/2010    Dr Oletta Lamas  . Esophagogastroduodenoscopy endoscopy  12/18/2010    Dr. Oletta Lamas slight gastritis   Social History:  reports that she has never smoked. She has never used smokeless tobacco. She reports that she does not drink alcohol or use illicit drugs. Can patient participate in ADLs? Yes.  Allergies  Allergen Reactions  . Codeine     UNKNOWN  . Tramadol     UNKNOWN  . Vesicare [Solifenacin Succinate]     UNKNOWN    Family History:  Family History  Problem Relation Age of Onset  . Diabetes Brother   . Stroke Mother  Prior to Admission medications   Medication Sig Start Date End Date Taking? Authorizing Provider  atenolol (TENORMIN) 25 MG tablet Take 12.5 mg by mouth every morning.    Yes Historical Provider, MD  calcium-vitamin D (OSCAL WITH D) 500-200 MG-UNIT per tablet Take 1 tablet by mouth every morning.    Yes Historical Provider, MD  clobetasol cream (TEMOVATE) [id number] % Apply 1 application topically 2 (two) times  daily. To red and scaly areas   Yes Historical Provider, MD  clopidogrel (PLAVIX) 75 MG tablet Take 75 mg by mouth daily with breakfast.   Yes Historical Provider, MD  Diazepam (VALIUM PO) Take 1 tablet by mouth 2 (two) times daily.   Yes Historical Provider, MD  ibuprofen (ADVIL,MOTRIN) 800 MG tablet Take 800 mg by mouth every 6 (six) hours as needed. For pain   Yes Historical Provider, MD  loperamide (IMODIUM A-D) 2 MG tablet Take 2 mg by mouth. 1/2 to 1 tablet daily as needed for diarrhea   Yes Historical Provider, MD  Melatonin 3 MG CAPS Take 3 mg by mouth at bedtime. To help with sleep   Yes Historical Provider, MD  metFORMIN (GLUCOPHAGE) 500 MG tablet Take 500 mg by mouth 2 (two) times daily with a meal.   Yes Historical Provider, MD  polyethylene glycol (MIRALAX / GLYCOLAX) packet Take 17 g by mouth as needed.   Yes Historical Provider, MD  Sodium Fluoride (PREVIDENT 5000 BOOSTER) 1.1 % PSTE Place 1 application onto teeth at bedtime.   Yes Historical Provider, MD  spironolactone (ALDACTONE) 50 MG tablet Take 50 mg by mouth 2 (two) times daily.   Yes Historical Provider, MD  triamcinolone cream (KENALOG) 0.1 % Apply 1 application topically. Apply thin layer twice daily as needed for groin rash for no longer than 2 weeks at a time. Apply as needed for redness and itching   Yes Historical Provider, MD    Physical Exam: Filed Vitals:   11/14/13 1944 11/14/13 2218  BP: 141/66 113/67  Pulse: 79 81  Temp: 98.8 F (37.1 C)   TempSrc: Oral   Resp: 18 18  SpO2: 98% 97%     General:  Well developed and nourished.  Eyes: Anicteric no pallor.  ENT: No discharge from the ears eyes nose mouth.  Neck: No mass felt.  Cardiovascular: S1-S2 heard.  Respiratory: No rhonchi or crepitations.  Abdomen: Soft nontender bowel sounds present. No guarding no rigidity.  Skin: No rash.  Musculoskeletal: No edema.  Psychiatric: Appears normal.  Neurologic: Alert awake oriented to time place and  person. Moves all extremities.  Labs on Admission:  Basic Metabolic Panel:  Recent Labs Lab 11/14/13 2000  NA 136*  K 4.7  CL 100  CO2 22  GLUCOSE 141*  BUN 31*  CREATININE 0.90  CALCIUM 9.4   Liver Function Tests:  Recent Labs Lab 11/14/13 2000  AST 22  ALT 27  ALKPHOS 62  BILITOT 0.5  PROT 7.0  ALBUMIN 3.7    Recent Labs Lab 11/14/13 2000  LIPASE 23   No results found for this basename: AMMONIA,  in the last 168 hours CBC:  Recent Labs Lab 11/14/13 2000  WBC 13.8*  HGB 12.7  HCT 37.9  MCV 95.2  PLT 364   Cardiac Enzymes: No results found for this basename: CKTOTAL, CKMB, CKMBINDEX, TROPONINI,  in the last 168 hours  BNP (last 3 results) No results found for this basename: PROBNP,  in the last 8760 hours CBG: No results  found for this basename: GLUCAP,  in the last 168 hours  Radiological Exams on Admission: No results found.   Assessment/Plan Principal Problem:   GI bleed Active Problems:   Essential hypertension, benign   Hyperlipidemia   Melena   DM (diabetes mellitus)   1. Acute GI bleed - could be lower GI bleed as patient states she has had frank bleeding. But given the history that patient has been taking ibuprofen recently and also has had some dark stools in the ER physician positive upper GI bleed also is there. Patient has been placed on Protonix infusion which will be continued. Discontinue ibuprofen and Plavix. Closely follow CBC. Patient is hemodynamically stable at this time. Patient states she has had a colonoscopy few years ago by Dr. Oletta Lamas, gastroenterologist. 2. History of hypertension - patient is presently n.p.o. I have placed patient on when necessary IV labetalol for systolic blood pressure more than 180. 3. Diabetes mellitus type 2 - patient scheduled CBG checks with sliding scale coverage. Hold metformin for now as patient is n.p.o. 4. History of hyperlipidemia - continue home medications once patient is able to take  orally. 5. History of TIA and peripheral vascular disease - holding Plavix secondary to GI bleeding.  Have reviewed patient's old charts and labs.  Code Status: Full code.  Family Communication: None.  Disposition Plan: Admit to inpatient.    Siyana Erney N. [facility] Pager [phone].  If 7PM-7AM, please contact night-coverage [email or web address] Password [facility] 11/14/2013, 10:37 PM

## 2013-11-14 NOTE — ED Notes (Signed)
Bed: VX79 Expected date:  Expected time:  Means of arrival:  Comments: Tarry stools

## 2013-11-14 NOTE — ED Provider Notes (Signed)
CSN: [id number]     Arrival date & time 11/14/13  1936 History   First MD Initiated Contact with Patient 11/14/13 2015     Chief Complaint  Patient presents with  . Melena   (Consider location/radiation/quality/duration/timing/severity/associated sxs/prior Treatment) Patient is a 80 y.o. female presenting with hematochezia. The history is provided by the patient.  Rectal Bleeding Quality:  Black and tarry Amount:  Moderate Duration:  12 hours Timing:  Constant Progression:  Unchanged Chronicity:  New Context: spontaneously   Similar prior episodes: no   Relieved by:  Nothing Worsened by:  Nothing tried Ineffective treatments:  None tried Associated symptoms: no abdominal pain, no dizziness, no fever, no loss of consciousness and no vomiting     Past Medical History  Diagnosis Date  . Hyperlipidemia   . Neuropathy   . Paroxysmal atrial tachycardia   . Diabetes mellitus   . Venous insufficiency   . Anxiety   . Depressive disorder   . Plantar fascial fibromatosis   . Insomnia   . Dysphagia   . TIA (transient ischemic attack)   . Rickets, active   . Hypertension   . Basilar artery syndrome   . Other atopic dermatitis and related conditions 12/13/2012  . Diarrhea 12/13/2012  . Peripheral vascular disease, unspecified 08/14/2012  . Fecal smearing 04/05/2012  . Unspecified hereditary and idiopathic peripheral neuropathy 03/17/2011  . Macular degeneration (senile) of retina, unspecified 03/17/2011  . Rash and other nonspecific skin eruption 12/21/2010  . Closed fracture of intertrochanteric section of femur 03/04/2010  . Spinal stenosis, unspecified region other than cervical 11/18/2008  . Pain in limb 11/18/2008  . Abnormality of gait 11/18/2008  . Pain in joint, pelvic region and thigh 10/2008  . Pain in joint, ankle and foot 08/07/2008  . Unspecified urinary incontinence 05/13/2008  . Corns and callosities 02/05/2008  . Vitamin D deficiency 11/07/2007  . Edema 10/25/2007  . Transient  ischemic attack (TIA), and cerebral infarction without residual deficits(V12.54) 10/28/2002  . Type II or unspecified type diabetes mellitus with peripheral circulatory disorders, uncontrolled(250.72) 02/12/2013   Past Surgical History  Procedure Laterality Date  . Hip fracture surgery Right 02/2010  . Cholecystectomy  1964  . Tonsillectomy and adenoidectomy      as a child  . Breast mass excision    . Incision / drainage hand / finger  2001    cat bite  . Dilation and curettage of uterus  2003    post menopausal bleeding. Bicornate uterus/doble cervix, benign path  . Colonoscopy  12/18/2010    Dr Oletta Lamas  . Esophagogastroduodenoscopy endoscopy  12/18/2010    Dr. Oletta Lamas slight gastritis   Family History  Problem Relation Age of Onset  . Diabetes Brother   . Stroke Mother    History  Substance Use Topics  . Smoking status: Never Smoker   . Smokeless tobacco: Never Used  . Alcohol Use: No   OB History   Grav Para Term Preterm Abortions TAB SAB Ect Mult Living                 Review of Systems  Constitutional: Negative for fever and fatigue.  HENT: Negative for congestion and drooling.   Eyes: Negative for pain.  Respiratory: Negative for cough and shortness of breath.   Cardiovascular: Negative for chest pain.  Gastrointestinal: Positive for hematochezia. Negative for nausea, vomiting, abdominal pain and diarrhea.  Genitourinary: Negative for dysuria and hematuria.       Dark stools  Musculoskeletal: Negative for back pain, gait problem and neck pain.  Skin: Negative for color change.  Neurological: Negative for dizziness, loss of consciousness and headaches.  Hematological: Negative for adenopathy.  Psychiatric/Behavioral: Negative for behavioral problems.  All other systems reviewed and are negative.    Allergies  Codeine; Tramadol; and Vesicare  Home Medications   Current Outpatient Rx  Name  Route  Sig  Dispense  Refill  . atenolol (TENORMIN) 25 MG tablet    Oral   Take 12.5 mg by mouth every morning.          . calcium-vitamin D (OSCAL WITH D) 500-200 MG-UNIT per tablet   Oral   Take 1 tablet by mouth every morning.          . clobetasol cream (TEMOVATE) 0.05 %   Topical   Apply 1 application topically 2 (two) times daily. To red and scaly areas         . clopidogrel (PLAVIX) 75 MG tablet   Oral   Take 75 mg by mouth daily with breakfast.         . Diazepam (VALIUM PO)   Oral   Take 1 tablet by mouth 2 (two) times daily.         Marland Kitchen ibuprofen (ADVIL,MOTRIN) 800 MG tablet   Oral   Take 800 mg by mouth every 6 (six) hours as needed. For pain         . loperamide (IMODIUM A-D) 2 MG tablet   Oral   Take 2 mg by mouth. 1/2 to 1 tablet daily as needed for diarrhea         . Melatonin 3 MG CAPS   Oral   Take 3 mg by mouth at bedtime. To help with sleep         . metFORMIN (GLUCOPHAGE) 500 MG tablet   Oral   Take 500 mg by mouth 2 (two) times daily with a meal.         . polyethylene glycol (MIRALAX / GLYCOLAX) packet   Oral   Take 17 g by mouth as needed.         . Sodium Fluoride (PREVIDENT 5000 BOOSTER) 1.1 % PSTE   dental   Place 1 application onto teeth at bedtime.         Marland Kitchen spironolactone (ALDACTONE) 50 MG tablet   Oral   Take 50 mg by mouth 2 (two) times daily.         Marland Kitchen triamcinolone cream (KENALOG) 0.1 %   Topical   Apply 1 application topically. Apply thin layer twice daily as needed for groin rash for no longer than 2 weeks at a time. Apply as needed for redness and itching          BP 141/66  Pulse 79  Temp(Src) 98.8 F (37.1 C) (Oral)  Resp 18  SpO2 98% Physical Exam  Nursing note and vitals reviewed. Constitutional: She is oriented to person, place, and time. She appears well-developed and well-nourished.  HENT:  Head: Normocephalic.  Mouth/Throat: Oropharynx is clear and moist. No oropharyngeal exudate.  Eyes: Conjunctivae and EOM are normal. Pupils are equal, round, and  reactive to light.  Neck: Normal range of motion. Neck supple.  Cardiovascular: Normal rate, regular rhythm, normal heart sounds and intact distal pulses.  Exam reveals no gallop and no friction rub.   No murmur heard. Pulmonary/Chest: Effort normal and breath sounds normal. No respiratory distress. She has no wheezes.  Abdominal: Soft.  Bowel sounds are normal. There is no tenderness. There is no rebound and no guarding.  Genitourinary:  Normal appearing external rectum. Dark melanotic appearing stool. Normal palpation on rectal exam.  Musculoskeletal: Normal range of motion. She exhibits no edema and no tenderness.  Neurological: She is alert and oriented to person, place, and time.  Skin: Skin is warm and dry.  Psychiatric: She has a normal mood and affect. Her behavior is normal.    ED Course  Procedures (including critical care time) Labs Review Labs Reviewed  CBC - Abnormal; Notable for the following:    WBC 13.8 (*)    All other components within normal limits  COMPREHENSIVE METABOLIC PANEL - Abnormal; Notable for the following:    Sodium 136 (*)    Glucose, Bld 141 (*)    BUN 31 (*)    GFR calc non Af Amer 55 (*)    GFR calc Af Amer 64 (*)    All other components within normal limits  URINALYSIS, ROUTINE W REFLEX MICROSCOPIC - Abnormal; Notable for the following:    Hgb urine dipstick MODERATE (*)    Leukocytes, UA SMALL (*)    All other components within normal limits  CBC - Abnormal; Notable for the following:    WBC 12.6 (*)    RBC 3.57 (*)    Hemoglobin 11.3 (*)    HCT 34.3 (*)    All other components within normal limits  COMPREHENSIVE METABOLIC PANEL - Abnormal; Notable for the following:    Glucose, Bld 132 (*)    BUN 25 (*)    Albumin 3.3 (*)    GFR calc non Af Amer 58 (*)    GFR calc Af Amer 67 (*)    All other components within normal limits  GLUCOSE, CAPILLARY - Abnormal; Notable for the following:    Glucose-Capillary 110 (*)    All other components  within normal limits  CBC - Abnormal; Notable for the following:    WBC 13.2 (*)    RBC 3.64 (*)    Hemoglobin 11.5 (*)    HCT 34.9 (*)    All other components within normal limits  GLUCOSE, CAPILLARY - Abnormal; Notable for the following:    Glucose-Capillary 145 (*)    All other components within normal limits  MRSA PCR SCREENING  PROTIME-INR  LIPASE, BLOOD  URINE MICROSCOPIC-ADD ON  LACTIC ACID, PLASMA  TYPE AND SCREEN  ABO/RH   Imaging Review No results found.  EKG Interpretation    Date/Time:    Ventricular Rate:    PR Interval:    QRS Duration:   QT Interval:    QTC Calculation:   R Axis:     Text Interpretation:              Date: 11/14/2013  Rate: 84  Rhythm: normal sinus rhythm  QRS Axis: left  Intervals: normal  ST/T Wave abnormalities: normal  Conduction Disutrbances:none  Narrative Interpretation: No ST or T wave changes consistent with ischemia.    Old EKG Reviewed: unchanged    MDM   1. Melena   2. GI bleed   3. DM (diabetes mellitus)   4. Essential hypertension, benign   5. Hyperlipidemia    8:58 PM 80 y.o. female on plavix who presents with several episodes of dark stools today. She denies any other associated symptoms. She denies any fevers, vomiting, diarrhea, abdominal pain. She is afebrile and vital signs are unremarkable here. She denies any history of dark  stools in the past. I visualized a stool here which appears melanotic. Normal rectal exam w/ dark stool. Nursing performed hemeoccult which was positive.   The [facility]. Will start ppi gtt.      Blanchard Kelch, MD 11/15/13 1147

## 2013-11-15 ENCOUNTER — Encounter (HOSPITAL_COMMUNITY): Payer: Self-pay

## 2013-11-15 DIAGNOSIS — D62 Acute posthemorrhagic anemia: Secondary | ICD-10-CM

## 2013-11-15 DIAGNOSIS — D72829 Elevated white blood cell count, unspecified: Secondary | ICD-10-CM

## 2013-11-15 DIAGNOSIS — K921 Melena: Principal | ICD-10-CM

## 2013-11-15 LAB — COMPREHENSIVE METABOLIC PANEL
ALK PHOS: 54 U/L (ref 39–117)
ALT: 24 U/L (ref 0–35)
AST: 21 U/L (ref 0–37)
Albumin: 3.3 g/dL — ABNORMAL LOW (ref 3.5–5.2)
BUN: 25 mg/dL — ABNORMAL HIGH (ref 6–23)
CALCIUM: 8.8 mg/dL (ref 8.4–10.5)
CO2: 22 mEq/L (ref 19–32)
Chloride: 103 mEq/L (ref 96–112)
Creatinine, Ser: 0.87 mg/dL (ref 0.50–1.10)
GFR calc Af Amer: 67 mL/min — ABNORMAL LOW (ref >90)
GFR calc non Af Amer: 58 mL/min — ABNORMAL LOW (ref >90)
Glucose, Bld: 132 mg/dL — ABNORMAL HIGH (ref 70–99)
Potassium: 4 mEq/L (ref 3.7–5.3)
SODIUM: 138 meq/L (ref 137–147)
Total Bilirubin: 0.8 mg/dL (ref 0.3–1.2)
Total Protein: 6.2 g/dL (ref 6.0–8.3)

## 2013-11-15 LAB — CBC
HCT: 34.3 % — ABNORMAL LOW (ref 36.0–46.0)
HCT: 34.9 % — ABNORMAL LOW (ref 36.0–46.0)
HEMOGLOBIN: 11.3 g/dL — AB (ref 12.0–15.0)
HEMOGLOBIN: 11.5 g/dL — AB (ref 12.0–15.0)
MCH: 31.7 pg (ref 26.0–34.0)
MCH: 31.6 pg (ref 26.0–34.0)
MCHC: 32.9 g/dL (ref 30.0–36.0)
MCHC: 33 g/dL (ref 30.0–36.0)
MCV: 96.1 fL (ref 78.0–100.0)
MCV: 95.9 fL (ref 78.0–100.0)
PLATELETS: 320 10*3/uL (ref 150–400)
Platelets: 330 10*3/uL (ref 150–400)
RBC: 3.57 MIL/uL — AB (ref 3.87–5.11)
RBC: 3.64 MIL/uL — ABNORMAL LOW (ref 3.87–5.11)
RDW: 13.3 % (ref 11.5–15.5)
RDW: 13.3 % (ref 11.5–15.5)
WBC: 12.6 10*3/uL — AB (ref 4.0–10.5)
WBC: 13.2 10*3/uL — AB (ref 4.0–10.5)

## 2013-11-15 LAB — GLUCOSE, CAPILLARY
Glucose-Capillary: 110 mg/dL — ABNORMAL HIGH (ref 70–99)
Glucose-Capillary: 145 mg/dL — ABNORMAL HIGH (ref 70–99)
Glucose-Capillary: 155 mg/dL — ABNORMAL HIGH (ref 70–99)

## 2013-11-15 LAB — LACTIC ACID, PLASMA: Lactic Acid, Venous: 1.4 mmol/L (ref 0.5–2.2)

## 2013-11-15 LAB — OCCULT BLOOD, POC DEVICE: Fecal Occult Bld: POSITIVE — AB

## 2013-11-15 LAB — ABO/RH: ABO/RH(D): O POS

## 2013-11-15 LAB — MRSA PCR SCREENING: MRSA BY PCR: NEGATIVE

## 2013-11-15 MED ORDER — ACETAMINOPHEN 500 MG PO TABS: 1000.0000 mg | ORAL_TABLET | Freq: Three times a day (TID) | ORAL | Status: DC | PRN

## 2013-11-15 MED ORDER — PANTOPRAZOLE SODIUM 40 MG PO TBEC: 40.0000 mg | DELAYED_RELEASE_TABLET | Freq: Two times a day (BID) | ORAL | Status: DC

## 2013-11-15 MED ORDER — ATENOLOL 12.5 MG HALF TABLET
12.5000 mg | ORAL_TABLET | Freq: Every day | ORAL | Status: DC
  Filled 2013-11-15: qty 1

## 2013-11-15 MED ORDER — HALOPERIDOL LACTATE 5 MG/ML IJ SOLN: 1.0000 mg | Freq: Four times a day (QID) | INTRAMUSCULAR | Status: DC | PRN

## 2013-11-15 MED ORDER — DIAZEPAM 2 MG PO TABS: 2.0000 mg | ORAL_TABLET | Freq: Two times a day (BID) | ORAL | Status: DC

## 2013-11-15 NOTE — Care Management Note (Addendum)
    Page 1 of 1   11/15/2013     2:32:40 PM   CARE MANAGEMENT NOTE 11/15/2013  Patient:  Kara Harris   Account Number:  [id number]  Date Initiated:  11/15/2013  Documentation initiated by:  Dessa Phi  Subjective/Objective Assessment:   80 Y/O F ADMITTED W/RECTAL BLEED.     Action/Plan:   FROM [facility].   Anticipated DC Date:  11/19/2013   Anticipated DC Plan:  ASSISTED LIVING / [place]  CM consult      Choice offered to / List presented to:             Status of service:  Completed, signed off Medicare Important Message given?   (If response is "NO", the following Medicare IM given date fields will be blank) Date Medicare IM given:   Date Additional Medicare IM given:    Discharge Disposition:  ASSISTED LIVING  Per UR Regulation:  Reviewed for med. necessity/level of care/duration of stay  If discussed at [place] of Stay Meetings, dates discussed:    Comments:  11/15/13 Niaya Hickok RN,BSN NCM [phone] PER MD FOR RETURN TO [place].

## 2013-11-15 NOTE — Discharge Summary (Signed)
Physician Discharge Summary  Kara Harris [id number] DOB: 21-Jul-1923 DOA: 11/14/2013  PCP: Estill Dooms, MD  Admit date: 11/14/2013 Discharge date: 11/15/2013  Recommendations for Outpatient Follow-up:  1. F/u with Dr. Oletta Lamas in 1-2 weeks.  Please assist patient with scheduling appointment and transportation if needed.  Please repeat CBC and address plavix.   2. Recommend routine TSH, B12, HIV for dementia screening as well as repeating MMSE 3. Resume spironolactone on 1/30 please.  Hold this evening's dose because she did not drink as much today due to hospitalization, NPO, and transfer back to facility.  Discharge Diagnoses:  Principal Problem:   GI bleed Active Problems:   Essential hypertension, benign   Irritable bowel syndrome   Hyperlipidemia   Melena   DM (diabetes mellitus)   Leukocytosis, unspecified   Acute blood loss anemia   Discharge Condition: stable, improved  Diet recommendation:  Diabetic diet  Wt Readings from Last 3 Encounters:  11/15/13 70.3 kg (154 lb 15.7 oz)  08/29/13 71.215 kg (157 lb)  02/12/13 71.215 kg (157 lb)    History of present illness:  Kara Harris is a 80 y.o. female with history of hypertension, hyperlipidemia, peripheral vascular disease, history of TIA and diabetes mellitus started experiencing rectal bleeding since afternoon today. Patient has had at least 3-4 bowel movements before she came to the ER. In the ER patient had a bowel movement which was dark in color. ER physician mentions it as almost black in color. Stool for occult was positive. Patient otherwise was hemodynamically stable. Patient states that over the last few days she has been taking ibuprofen for back pain. Patient otherwise denies any nausea vomiting abdominal pain diarrhea. Denies having used any recent antibiotics. Denies any chest pain or shortness of breath. Patient has been started on Protonix infusion and admitted for further workup.    Hospital Course:    Acute blood loss anemia due to acute UGIB given elevated BUN and melena.  This may have been caused by NSAID use.  She is also on plavix.  Both of these medications have been discontinued at this time and she was started on protonix infusion.  Hemoglobin stable near 11.5mg /dl.  Stools appearing more normal again.  She was seen by GI who recommended outpatient follow up in 1-2 weeks for repeat CBC.    Delirium with possibly some underlying dementia - Per Celese, her RN at [employer] [employer], she has had some mental decline over the last 6 months. She was tangential and vague with her medical history. She did not remember my visit this morning and became angry with nursing about not seeing the doctor. Upon revisiting, she asked me many of the same questions again that I had answered only a few minutes before. She demanded to see her GI doctor ASAP and was upset that she had not already had endoscopy performed first thing this morning. She stated she did not believe me that I had spoken with the GI office this morning and demanded to call them herself.  Later, when Dr. Watt Climes visited, she declined the endoscopy.  UA neg nitrite, small LE, only 3-6 WBC and no bacteria, not suggestive of UTI.  No respiratory symptoms to suggest pneumonia and she has been afebrile.  Recommend routine evaluation for dementia at ALF per supervising physician.    HTN, blood pressure stable.  She may resume her atenolol today and her spironolactone tomorrow.    T2DM, given low dose SSI while inpatient but may resume metformin  tomorrow.  A1c screening per PCP.  Goal A1c of 7.5.    TIA/PVD, stable.  Hold plavix.  GI to address safety of restarting at f/u appointment in 1 week.  Leukocytosis, may be chronic based on labs from 2011. Afebrile.  - UA neg  - No CXR as no resp symptoms  - Monitor for evidence of infection  Consultants:  GI, called Consult this AM Procedures:  None Antibiotics:  none    Discharge Exam: Filed Vitals:    11/15/13 1410  BP: 130/65  Pulse: 77  Temp: 97.7 F (36.5 C)  Resp: 20   Filed Vitals:   11/14/13 2250 11/14/13 2346 11/15/13 0356 11/15/13 1410  BP: 133/65 119/41 120/57 130/65  Pulse: 87 76 74 77  Temp:  98.4 F (36.9 C) 97.9 F (36.6 C) 97.7 F (36.5 C)  TempSrc:  Oral Oral Oral  Resp: 18 20 20 20   Height:  5\' 8"  (1.727 m)    Weight:  70.9 kg (156 lb 4.9 oz) 70.3 kg (154 lb 15.7 oz)   SpO2: 94% 98% 96% 98%    General: CF, No acute distress  HEENT: NCAT, MMM  Cardiovascular: RRR, nl S1, S2 no mrg, 2+ pulses, warm extremities  Respiratory: CTAB, no increased WOB  Abdomen: NABS, soft, NT/ND  MSK: Normal tone and bulk, no LEE  Neuro: Grossly intact   Discharge Instructions      Discharge Orders   Future Appointments Provider Department Dept Phone   12/10/2013 3:30 PM Estill Dooms, MD [place] [phone]   02/27/2014 11:00 AM Mardene Celeste, NP [place] [phone]   09/02/2014 2:30 PM Estill Dooms, MD [facility] [phone]   Future Orders Complete By Expires   Call MD for:  difficulty breathing, headache or visual disturbances  As directed    Call MD for:  extreme fatigue  As directed    Call MD for:  hives  As directed    Call MD for:  persistant dizziness or light-headedness  As directed    Call MD for:  persistant nausea and vomiting  As directed    Call MD for:  severe uncontrolled pain  As directed    Call MD for:  temperature >100.4  As directed    Diet Carb Modified  As directed    Discharge instructions  As directed    Comments:     You were hospitalized with rectal bleeding.  Please stop taking your ibuprofen and your plavix as these medications increase your risk of bleeding.  Please use tylenol as needed for pain instead.  Please follow up with Dr. Oletta Lamas in 1-2 weeks as outpatient.  Return to the hospital if your rectal bleeding recurs.  Please take protonix twice daily to help heal your stomach.   Increase  activity slowly  As directed        Medication List    STOP taking these medications       clopidogrel 75 MG tablet  Commonly known as:  PLAVIX     ibuprofen 800 MG tablet  Commonly known as:  ADVIL,MOTRIN     VALIUM PO      TAKE these medications       acetaminophen 500 MG tablet  Commonly known as:  TYLENOL  Take 2 tablets (1,000 mg total) by mouth every 8 (eight) hours as needed for moderate pain, fever or headache (severe pain).     atenolol 25 MG tablet  Commonly known as:  TENORMIN  Take 12.5 mg by mouth every morning.     calcium-vitamin D 500-200 MG-UNIT per tablet  Commonly known as:  OSCAL WITH D  Take 1 tablet by mouth every morning.     clobetasol cream 0.05 %  Commonly known as:  TEMOVATE  Apply 1 application topically 2 (two) times daily. To red and scaly areas     loperamide 2 MG tablet  Commonly known as:  IMODIUM A-D  Take 2 mg by mouth. 1/2 to 1 tablet daily as needed for diarrhea     Melatonin 3 MG Caps  Take 3 mg by mouth at bedtime. To help with sleep     metFORMIN 500 MG tablet  Commonly known as:  GLUCOPHAGE  Take 500 mg by mouth 2 (two) times daily with a meal.     pantoprazole 40 MG tablet  Commonly known as:  PROTONIX  Take 1 tablet (40 mg total) by mouth 2 (two) times daily before a meal.     polyethylene glycol packet  Commonly known as:  MIRALAX / GLYCOLAX  Take 17 g by mouth as needed.     PREVIDENT 5000 BOOSTER 1.1 % Pste  Generic drug:  Sodium Fluoride  Place 1 application onto teeth at bedtime.     spironolactone 50 MG tablet  Commonly known as:  ALDACTONE  Take 50 mg by mouth 2 (two) times daily.     triamcinolone cream 0.1 %  Commonly known as:  KENALOG  Apply 1 application topically. Apply thin layer twice daily as needed for groin rash for no longer than 2 weeks at a time. Apply as needed for redness and itching       Follow-up Information   Follow up with GREEN, Viviann Spare, MD. Schedule an appointment as soon as  possible for a visit in 2 weeks.   Specialty:  Internal Medicine   Contact information:   [place] [address] [phone]       Follow up with EDWARDS JR,JAMES L, MD. Schedule an appointment as soon as possible for a visit in 1 week.   Specialty:  Gastroenterology   Contact information:   [place] [address] [phone]       The results of significant diagnostics from this hospitalization (including imaging, microbiology, ancillary and laboratory) are listed below for reference.    Significant Diagnostic Studies: No results found.  Microbiology: Recent Results (from the past 240 hour(s))  MRSA PCR SCREENING     Status: None   Collection Time    11/15/13  3:28 AM      Result Value Range Status   MRSA by PCR NEGATIVE  NEGATIVE Final   Comment:            The GeneXpert MRSA Assay (FDA     approved for NASAL specimens     only), is one component of a     comprehensive MRSA colonization     surveillance program. It is not     intended to diagnose MRSA     infection nor to guide or     monitor treatment for     MRSA infections.     Labs: Basic Metabolic Panel:  Recent Labs Lab 11/14/13 2000 11/15/13 0423  NA 136* 138  K 4.7 4.0  CL  100 103  CO2 22 22  GLUCOSE 141* 132*  BUN 31* 25*  CREATININE 0.90 0.87  CALCIUM 9.4 8.8   Liver Function Tests:  Recent Labs Lab 11/14/13 2000 11/15/13 0423  AST 22 21  ALT 27 24  ALKPHOS 62 54  BILITOT 0.5 0.8  PROT 7.0 6.2  ALBUMIN 3.7 3.3*    Recent Labs Lab 11/14/13 2000  LIPASE 23   No results found for this basename: AMMONIA,  in the last 168 hours CBC:  Recent Labs Lab 11/14/13 2000 11/15/13 0423 11/15/13 0745  WBC 13.8* 12.6* 13.2*  HGB 12.7 11.3* 11.5*  HCT 37.9 34.3* 34.9*  MCV 95.2 96.1 95.9  PLT 364 320 330   Cardiac Enzymes: No results found for this basename: CKTOTAL, CKMB, CKMBINDEX, TROPONINI,  in the last 168  hours BNP: BNP (last 3 results) No results found for this basename: PROBNP,  in the last 8760 hours CBG:  Recent Labs Lab 11/15/13 0011 11/15/13 0803 11/15/13 1141  GLUCAP 110* 145* 155*    Time coordinating discharge: 45 minutes  Signed:  Suhayla Chisom  [facility] 11/15/2013, 2:35 PM

## 2013-11-15 NOTE — Progress Notes (Addendum)
11/15/13 1400  OT G-codes **NOT FOR INPATIENT CLASS**  Functional Assessment Tool Used clinical judgment/observation  Functional Limitation Self care  Self Care Current Status (H2992) CJ  Self Care Goal Status [phone]) CI  Lesle Chris, OTR/L [phone] 11/15/2013

## 2013-11-15 NOTE — Evaluation (Signed)
Physical Therapy One Time Evaluation Patient Details Name: Kara Harris MRN: [id number] DOB: [birth year] Today's Date: 11/15/2013 Time: 0102-7253 PT Time Calculation (min): 21 min  PT Assessment / Plan / Recommendation History of Present Illness  Pt admitted for acute blood loss anemia due to acute UGIB from [facility]  Clinical Impression  Patient evaluated by Physical Therapy with no further acute PT needs identified. All education has been completed and the patient has no further questions.Pt had pulled out IV upon entering room with RN, however appears at baseline in regards to mobility.  Pt plans on returning to [place] today.  No follow-up Physial Therapy or equipment needs identified. PT is signing off. Thank you for this referral.    PT Assessment  Patent does not need any further PT services    Follow Up Recommendations  No PT follow up    Does the patient have the potential to tolerate intense rehabilitation      Barriers to Discharge        Equipment Recommendations  None recommended by PT    Recommendations for Other Services     Frequency      Precautions / Restrictions Precautions Precautions: Fall Restrictions Weight Bearing Restrictions: No   Pertinent Vitals/Pain None stated      Mobility  Bed Mobility Overal bed mobility: Modified Independent General bed mobility comments: from [facility] raise about 30 degrees Transfers Overall transfer level: Modified independent Equipment used: Rolling walker (2 wheeled) Transfers: Sit to/from Stand Sit to Stand: Supervision General transfer comment: cues for hand placement Ambulation/Gait Ambulation/Gait assistance: Supervision Ambulation Distance (Feet): 400 Feet Assistive device: Rolling walker (2 wheeled) Gait Pattern/deviations: Trunk flexed Gait velocity: 1.08 ft/sec Gait velocity interpretation: <1.8 ft/sec, indicative of risk for recurrent falls General Gait Details: increased trunk flexion with RW  however likely due to also using rollator at ALF, pt reports no falls in last 4 years (states last fall she broke her R hip and has used a walker ever since)    Exercises     PT Diagnosis:    PT Problem List:   PT Treatment Interventions:       PT Goals(Current goals can be found in the care plan section) Acute Rehab PT Goals PT Goal Formulation: No goals set, d/c therapy  Visit Information  Last PT Received On: 11/15/13 Assistance Needed: +1 History of Present Illness: Pt admitted for acute blood loss anemia due to acute UGIB from [facility]       Prior [place] expects to be discharged to:: Assisted living Living Arrangements: Alone Home Equipment: Gilford Rile - 2 wheels;Walker - 4 wheels Additional Comments: pt states she stands in shower but has seat; has 3:1 commode over toilet Prior Function Level of Independence: Independent with assistive device(s) Comments: ambulates with RW to dining hall Communication Communication: No difficulties Dominant Hand: Right    Cognition  Cognition Arousal/Alertness: Awake/alert Behavior During Therapy: WFL for tasks assessed/performed Overall Cognitive Status: History of cognitive impairments - at baseline    Extremity/Trunk Assessment Upper Extremity Assessment Upper Extremity Assessment: Overall WFL for tasks assessed Lower Extremity Assessment Lower Extremity Assessment: Overall WFL for tasks assessed   Balance    End of Session PT - End of Session Activity Tolerance: Patient tolerated treatment well Patient left: in bed;with call bell/phone within reach  GP Functional Assessment Tool Used: clinical judgement Functional Limitation: Mobility: Walking and moving around Mobility: Walking and Moving Around Current Status (G6440): At least 1 percent  but less than 20 percent impaired, limited or restricted Mobility: Walking and Moving Around Goal Status [phone]): 0 percent impaired, limited or  restricted Mobility: Walking and Moving Around Discharge Status [phone]): 0 percent impaired, limited or restricted   Marquelle Musgrave,KATHrine E 11/15/2013, 3:15 PM Carmelia Bake, PT, DPT 11/15/2013 Pager: [phone]

## 2013-11-15 NOTE — Progress Notes (Addendum)
[facility] PROGRESS NOTE  Kara Harris DTO:[id number] DOB: 1923-07-22 DOA: 11/14/2013 PCP: Estill Dooms, MD  Assessment/Plan  Acute blood loss anemia due to acute UGIB, patient is a difficult historian.  Tells me she has had blood in her urine, not her stool.  Denies blood in stools, black tarry stools.  States she has intermittent constipation and diarrhea over the last few years which is worsening.  She denies nausea or epigastric pain, but does endorse taking NSAIDS.  Per ER, she had melena and was occult stool positive, but her blood counts did not drop as rapidly or as much as I would except for someone having such brisk UGIB that they had BRBPR.  Her elevated BUN suggests UGIB also.  Perhaps she had some superimposed hemorrhoid bleeding. -  Hemoglobin stable near 11.5mg /dl -  Stools appearing more normal again -  Check CBC in AM -  Continue protonix -  Start CLD -  GI consultation per patient request  Delirium with possibly some underlying dementia - Per Celese, her RN at [employer], she has had some mental decline over the last 6 months.  She was tangential and vague with her medical history.  She did not remember my visit this morning and became angry with nursing about not seeing the doctor.  Upon revisiting, she asked me many of the same questions again that I had answered only a few minutes before.  She demanded to see her GI doctor ASAP and was upset that she had not already had endoscopy performed first thing this morning.  She stated she did not believe me that I had spoken with the GI office this morning and demanded to call them herself.  -  TSH, B12 -  May need haldol +/- sitter this evening if worsens -  UA neg nitrite, small LE, only 3-6 WBC and no bacteria -  No resp sx to suggest pneumonia  HTN, blood pressure stable. -  Restart atenolol -  D/C IV labetalol  T2DM -  Hold metformin -  Continue SSI  TIA/PVD, stable -  Continue to hold plavix  Leukocytosis,  may be chronic based on labs from 2011.  Afebrile. -  UA neg -  No CXR as no resp symptoms -  Monitor for evidence of infection  Diet:  CLD Access:  PIV IVF:  yes Proph:  SCDs  Code Status: Full Family Communication: patient alone Disposition Plan: back to ALF this afternoon or tomorrow pending GI recommendations.    Consultants:  GI, called Consult this AM  Procedures:  None  Antibiotics:  none   HPI/Subjective:  Denies abdominal pain, nausea, vomiting.  Had more brown stool this AM.      Objective: Filed Vitals:   11/14/13 2218 11/14/13 2250 11/14/13 2346 11/15/13 0356  BP: 113/67 133/65 119/41 120/57  Pulse: 81 87 76 74  Temp:   98.4 F (36.9 C) 97.9 F (36.6 C)  TempSrc:   Oral Oral  Resp: 18 18 20 20   Height:   5\' 8"  (1.727 m)   Weight:   70.9 kg (156 lb 4.9 oz) 70.3 kg (154 lb 15.7 oz)  SpO2: 97% 94% 98% 96%    Intake/Output Summary (Last 24 hours) at 11/15/13 1232 Last data filed at 11/15/13 1125  Gross per 24 hour  Intake 673.75 ml  Output   1228 ml  Net -554.25 ml   Filed Weights   11/14/13 2346 11/15/13 0356  Weight: 70.9 kg (156 lb 4.9 oz)  70.3 kg (154 lb 15.7 oz)    Exam:   General:  CF, No acute distress  HEENT:  NCAT, MMM  Cardiovascular:  RRR, nl S1, S2 no mrg, 2+ pulses, warm extremities  Respiratory:  CTAB, no increased WOB  Abdomen:   NABS, soft, NT/ND  MSK:   Normal tone and bulk, no LEE  Neuro:  Grossly intact  Data Reviewed: Basic Metabolic Panel:  Recent Labs Lab 11/14/13 2000 11/15/13 0423  NA 136* 138  K 4.7 4.0  CL 100 103  CO2 22 22  GLUCOSE 141* 132*  BUN 31* 25*  CREATININE 0.90 0.87  CALCIUM 9.4 8.8   Liver Function Tests:  Recent Labs Lab 11/14/13 2000 11/15/13 0423  AST 22 21  ALT 27 24  ALKPHOS 62 54  BILITOT 0.5 0.8  PROT 7.0 6.2  ALBUMIN 3.7 3.3*    Recent Labs Lab 11/14/13 2000  LIPASE 23   No results found for this basename: AMMONIA,  in the last 168 hours CBC:  Recent  Labs Lab 11/14/13 2000 11/15/13 0423 11/15/13 0745  WBC 13.8* 12.6* 13.2*  HGB 12.7 11.3* 11.5*  HCT 37.9 34.3* 34.9*  MCV 95.2 96.1 95.9  PLT 364 320 330   Cardiac Enzymes: No results found for this basename: CKTOTAL, CKMB, CKMBINDEX, TROPONINI,  in the last 168 hours BNP (last 3 results) No results found for this basename: PROBNP,  in the last 8760 hours CBG:  Recent Labs Lab 11/15/13 0011 11/15/13 0803 11/15/13 1141  GLUCAP 110* 145* 155*    Recent Results (from the past 240 hour(s))  MRSA PCR SCREENING     Status: None   Collection Time    11/15/13  3:28 AM      Result Value Range Status   MRSA by PCR NEGATIVE  NEGATIVE Final   Comment:            The GeneXpert MRSA Assay (FDA     approved for NASAL specimens     only), is one component of a     comprehensive MRSA colonization     surveillance program. It is not     intended to diagnose MRSA     infection nor to guide or     monitor treatment for     MRSA infections.     Studies: No results found.  Scheduled Meds: . [START ON 11/16/2013] clobetasol cream  1 application Topical BID  . insulin aspart  0-9 Units Subcutaneous TID WC  . sodium chloride  3 mL Intravenous Q12H  . Sodium Fluoride  1 application dental QHS   Continuous Infusions: . sodium chloride 100 mL/hr at 11/15/13 0553  . pantoprozole (PROTONIX) infusion 8 mg/hr (11/15/13 7782)    Principal Problem:   GI bleed Active Problems:   Essential hypertension, benign   Hyperlipidemia   Melena   DM (diabetes mellitus)    Time spent: 30 min    Zacari Stiff, [facility] Pager [phone]. If 7PM-7AM, please contact night-coverage at [email or web address], password [email or web address] 11/15/2013, 12:32 PM  LOS: 1 day

## 2013-11-15 NOTE — Progress Notes (Signed)
Clinical Social Work Department BRIEF PSYCHOSOCIAL ASSESSMENT 11/15/2013  Patient:  [facility] Kara Harris     Account Number:  [id number]     Admit date:  11/14/2013  Clinical Social Worker:  Earlie Server  Date/Time:  11/15/2013 03:20 PM  Referred by:  Physician  Date Referred:  11/15/2013 Referred for  ALF Placement   Other Referral:   Interview type:  Patient Other interview type:    PSYCHOSOCIAL DATA Living Status:  FACILITY Admitted from facility:  [facility] Level of care:  Assisted Living Primary support name:  Antony Haste Primary support relationship to patient:  FRIEND Degree of support available:   Unknown at this time    CURRENT CONCERNS Current Concerns  Post-Acute Placement   Other Concerns:    SOCIAL WORK ASSESSMENT / PLAN CSW received a call from RN reporting that patient is from ALF and ready to DC today. CSW reviewed chart and met with patient at bedside. CSW introduced myself and explained role.    Patient reports that she has been living at [facility] and would love to return. Patient reports that she has already spoken to facility and prefers to go back to facility today. CSW explained process and agreeable to keep patient updated on status.    CSW completed FL2 and faxed FL2 and DC summary to ALF to review. CSW spoke with [facility] who is agreeable to accept patient and reports that ALF will provide transportation. ALF agreeable to pick up patient in about 1 hour.    CSW informed patient of DC plans. CSW offered to contact friend or family re: DC plans but patient reports that she will contact them on her own. RN aware and agreeable plans. CSW prepared DC packet with FL2, hard scripts, and DNR form included.    CSW is signing off but available if further needs arise.   Assessment/plan status:  Psychosocial Support/Ongoing Assessment of Needs Other assessment/ plan:   N/A   Information/referral to community resources:   Will return to ALF     PATIENT'S/FAMILY'S RESPONSE TO PLAN OF CARE: Patient alert and oriented. Patient reports that she loves living at ALF and is excited that she is DC home today. Patient reports that she is independent and have already contacted ALF because she likes to be involved with her care. CSW praised patient for her attitude and assistance. Patient aware of plans and thanked CSW for help.       [place], [place] [phone]

## 2013-11-15 NOTE — Evaluation (Signed)
Occupational Therapy Evaluation Patient Details Name: Kara Harris MRN: [id number] DOB: Aug 11, 1923 Today's Date: 11/15/2013 Time: 6301-6010 OT Time Calculation (min): 26 min  OT Assessment / Plan / Recommendation History of present illness pt was admitted for UGIB   Clinical Impression   Pt is a resident at [facility] and was admitted for UGIB.  She is appropriate for skilled OT to increase safety and independence with adls.  Goals in acute are for supervision level.      OT Assessment  Patient needs continued OT Services    Follow Up Recommendations  Supervision - Intermittent (for ambulating; feel pt may be near baseline); HHOT    Barriers to Discharge      Equipment Recommendations  3 in 1 bedside comode    Recommendations for Other Services    Frequency  Min 2X/week    Precautions / Restrictions Precautions Precautions: Fall Restrictions Weight Bearing Restrictions: No   Pertinent Vitals/Pain No pain reported    ADL  Grooming: Brushing hair;Wash/dry hands;Supervision/safety Where Assessed - Grooming: Supported standing Lower Body Bathing: Simulated;Min guard Where Assessed - Lower Body Bathing: Supported sit to stand Lower Body Dressing: Simulated;Min guard Where Assessed - Lower Body Dressing: Supported sit to Lobbyist: Magazine features editor Method: Sit to Loss adjuster, chartered: Therapist, occupational and Hygiene: Supervision/safety Where Assessed - Best boy and Hygiene: Sit to stand from 3-in-1 or toilet Equipment Used: Rolling walker Transfers/Ambulation Related to ADLs: pt ambulated to bathroom; cues to keep body inside of walker as she attempted to park it at times.   ADL Comments: Pt did not want to perform bathing:  prefers to wait until she gets back to [facility]    OT Diagnosis: Generalized weakness  OT Problem List: Decreased strength;Decreased activity  tolerance;Impaired balance (sitting and/or standing);Decreased cognition OT Treatment Interventions: Self-care/ADL training;Balance training;Patient/family education;Cognitive remediation/compensation   OT Goals(Current goals can be found in the care plan section) Acute Rehab OT Goals OT Goal Formulation: With patient Time For Goal Achievement: 11/29/13 Potential to Achieve Goals: Good ADL Goals Pt Will Transfer to Toilet: with supervision;ambulating;bedside commode Additional ADL Goal #1: pt will gather clothes with supervision and complete adl at this level, sit to stand  Visit Information  Last OT Received On: 11/15/13 Assistance Needed: +1 History of Present Illness: pt was admitted for UGIB       Prior [place] expects to be discharged to:: Assisted living Living Arrangements: Alone Additional Comments: pt states she stands in shower but has seat; has 3:1 commode over toilet Prior Function Level of Independence: Independent with assistive device(s) Communication Communication: No difficulties Dominant Hand: Right         Vision/Perception     Cognition  Cognition Arousal/Alertness: Awake/alert Behavior During Therapy: WFL for tasks assessed/performed Overall Cognitive Status: History of cognitive impairments - at baseline    Extremity/Trunk Assessment Upper Extremity Assessment Upper Extremity Assessment: Overall WFL for tasks assessed     Mobility Bed Mobility Overal bed mobility: Modified Independent General bed mobility comments: from [facility] raise about 30 degrees Transfers Overall transfer level: Needs assistance Equipment used: Rolling walker (2 wheeled) Transfers: Sit to/from Stand Sit to Stand: Supervision General transfer comment: cues for hand placement     Exercise     Balance     End of Session OT - End of Session Activity Tolerance: Patient tolerated treatment well Patient left: in bed;with call  bell/phone within  reach  [place] 11/15/2013, 2:33 PM Lesle Chris, OTR/L [phone] 11/15/2013

## 2013-11-15 NOTE — Consult Note (Addendum)
Reason for Consult: GI bleed Referring Physician: Hospital team  Kara Harris is an 80 y.o. female.  HPI: Patient known to my partner Dr. Oletta Lamas from a long history of multiple GI complaints mostly IBS whose last procedures were in 2012 including colonoscopy and endoscopy and she had been in her usual state of health and our office computer chart was reviewed as well as the hospital chart and it sounds like she's been on ibuprofen for back pain but no extra aspirin but is on Plavix and started seen black or maroon stools yesterday but today she had brown stool and has no upper tract symptoms and her family history is unknown since most of her relatives died at young ages and she wants to go home and we discussed endoscopy versus overnight observation but she prefers to go home Past Medical History  Diagnosis Date  . Hyperlipidemia   . Neuropathy   . Paroxysmal atrial tachycardia   . Diabetes mellitus   . Venous insufficiency   . Anxiety   . Depressive disorder   . Plantar fascial fibromatosis   . Insomnia   . Dysphagia   . TIA (transient ischemic attack)   . Rickets, active   . Hypertension   . Basilar artery syndrome   . Other atopic dermatitis and related conditions 12/13/2012  . Diarrhea 12/13/2012  . Peripheral vascular disease, unspecified 08/14/2012  . Fecal smearing 04/05/2012  . Unspecified hereditary and idiopathic peripheral neuropathy 03/17/2011  . Macular degeneration (senile) of retina, unspecified 03/17/2011  . Rash and other nonspecific skin eruption 12/21/2010  . Closed fracture of intertrochanteric section of femur 03/04/2010  . Spinal stenosis, unspecified region other than cervical 11/18/2008  . Pain in limb 11/18/2008  . Abnormality of gait 11/18/2008  . Pain in joint, pelvic region and thigh 10/2008  . Pain in joint, ankle and foot 08/07/2008  . Unspecified urinary incontinence 05/13/2008  . Corns and callosities 02/05/2008  . Vitamin D deficiency 11/07/2007  . Edema  10/25/2007  . Transient ischemic attack (TIA), and cerebral infarction without residual deficits(V12.54) 10/28/2002  . Type II or unspecified type diabetes mellitus with peripheral circulatory disorders, uncontrolled(250.72) 02/12/2013    Past Surgical History  Procedure Laterality Date  . Hip fracture surgery Right 02/2010  . Cholecystectomy  1964  . Tonsillectomy and adenoidectomy      as a child  . Breast mass excision    . Incision / drainage hand / finger  2001    cat bite  . Dilation and curettage of uterus  2003    post menopausal bleeding. Bicornate uterus/doble cervix, benign path  . Colonoscopy  12/18/2010    Dr Oletta Lamas  . Esophagogastroduodenoscopy endoscopy  12/18/2010    Dr. Oletta Lamas slight gastritis    Family History  Problem Relation Age of Onset  . Diabetes Brother   . Stroke Mother     Social History:  reports that she has never smoked. She has never used smokeless tobacco. She reports that she does not drink alcohol or use illicit drugs.  Allergies:  Allergies  Allergen Reactions  . Codeine     UNKNOWN  . Tramadol     UNKNOWN  . Vesicare [Solifenacin Succinate]     UNKNOWN    Medications: I have reviewed the patient's current medications.  Results for orders placed during the hospital encounter of 11/14/13 (from the past 48 hour(s))  CBC     Status: Abnormal   Collection Time    11/14/13  8:00 PM      Result Value Range   WBC 13.8 (*) 4.0 - 10.5 K/uL   RBC 3.98  3.87 - 5.11 MIL/uL   Hemoglobin 12.7  12.0 - 15.0 g/dL   HCT 37.9  36.0 - 46.0 %   MCV 95.2  78.0 - 100.0 fL   MCH 31.9  26.0 - 34.0 pg   MCHC 33.5  30.0 - 36.0 g/dL   RDW 13.2  11.5 - 15.5 %   Platelets 364  150 - 400 K/uL  COMPREHENSIVE METABOLIC PANEL     Status: Abnormal   Collection Time    11/14/13  8:00 PM      Result Value Range   Sodium 136 (*) 137 - 147 mEq/L   Potassium 4.7  3.7 - 5.3 mEq/L   Chloride 100  96 - 112 mEq/L   CO2 22  19 - 32 mEq/L   Glucose, Bld 141 (*) 70 - 99  mg/dL   BUN 31 (*) 6 - 23 mg/dL   Creatinine, Ser 0.90  0.50 - 1.10 mg/dL   Calcium 9.4  8.4 - 10.5 mg/dL   Total Protein 7.0  6.0 - 8.3 g/dL   Albumin 3.7  3.5 - 5.2 g/dL   AST 22  0 - 37 U/L   ALT 27  0 - 35 U/L   Alkaline Phosphatase 62  39 - 117 U/L   Total Bilirubin 0.5  0.3 - 1.2 mg/dL   GFR calc non Af Amer 55 (*) >90 mL/min   GFR calc Af Amer 64 (*) >90 mL/min   Comment: (NOTE)     The eGFR has been calculated using the CKD EPI equation.     This calculation has not been validated in all clinical situations.     eGFR's persistently <90 mL/min signify possible Chronic Kidney     Disease.  PROTIME-INR     Status: None   Collection Time    11/14/13  8:00 PM      Result Value Range   Prothrombin Time 13.9  11.6 - 15.2 seconds   INR 1.09  0.00 - 1.49  LIPASE, BLOOD     Status: None   Collection Time    11/14/13  8:00 PM      Result Value Range   Lipase 23  11 - 59 U/L  ABO/RH     Status: None   Collection Time    11/14/13  8:47 PM      Result Value Range   ABO/RH(D) O POS    OCCULT BLOOD, POC DEVICE     Status: Abnormal   Collection Time    11/14/13  8:48 PM      Result Value Range   Fecal Occult Bld POSITIVE (*) NEGATIVE  TYPE AND SCREEN     Status: None   Collection Time    11/14/13  8:50 PM      Result Value Range   ABO/RH(D) O POS     Antibody Screen NEG     Sample Expiration 11/17/2013    URINALYSIS, ROUTINE W REFLEX MICROSCOPIC     Status: Abnormal   Collection Time    11/14/13 11:09 PM      Result Value Range   Color, Urine YELLOW  YELLOW   APPearance CLEAR  CLEAR   Specific Gravity, Urine 1.008  1.005 - 1.030   pH 7.0  5.0 - 8.0   Glucose, UA NEGATIVE  NEGATIVE mg/dL   Hgb urine dipstick   MODERATE (*) NEGATIVE   Bilirubin Urine NEGATIVE  NEGATIVE   Ketones, ur NEGATIVE  NEGATIVE mg/dL   Protein, ur NEGATIVE  NEGATIVE mg/dL   Urobilinogen, UA 0.2  0.0 - 1.0 mg/dL   Nitrite NEGATIVE  NEGATIVE   Leukocytes, UA SMALL (*) NEGATIVE  URINE  MICROSCOPIC-ADD ON     Status: None   Collection Time    11/14/13 11:09 PM      Result Value Range   WBC, UA 3-6  <3 WBC/hpf   RBC / HPF 0-2  <3 RBC/hpf  GLUCOSE, CAPILLARY     Status: Abnormal   Collection Time    11/15/13 12:11 AM      Result Value Range   Glucose-Capillary 110 (*) 70 - 99 mg/dL  MRSA PCR SCREENING     Status: None   Collection Time    11/15/13  3:28 AM      Result Value Range   MRSA by PCR NEGATIVE  NEGATIVE   Comment:            The GeneXpert MRSA Assay (FDA     approved for NASAL specimens     only), is one component of a     comprehensive MRSA colonization     surveillance program. It is not     intended to diagnose MRSA     infection nor to guide or     monitor treatment for     MRSA infections.  CBC     Status: Abnormal   Collection Time    11/15/13  4:23 AM      Result Value Range   WBC 12.6 (*) 4.0 - 10.5 K/uL   RBC 3.57 (*) 3.87 - 5.11 MIL/uL   Hemoglobin 11.3 (*) 12.0 - 15.0 g/dL   HCT 34.3 (*) 36.0 - 46.0 %   MCV 96.1  78.0 - 100.0 fL   MCH 31.7  26.0 - 34.0 pg   MCHC 32.9  30.0 - 36.0 g/dL   RDW 13.3  11.5 - 15.5 %   Platelets 320  150 - 400 K/uL  COMPREHENSIVE METABOLIC PANEL     Status: Abnormal   Collection Time    11/15/13  4:23 AM      Result Value Range   Sodium 138  137 - 147 mEq/L   Potassium 4.0  3.7 - 5.3 mEq/L   Chloride 103  96 - 112 mEq/L   CO2 22  19 - 32 mEq/L   Glucose, Bld 132 (*) 70 - 99 mg/dL   BUN 25 (*) 6 - 23 mg/dL   Creatinine, Ser 0.87  0.50 - 1.10 mg/dL   Calcium 8.8  8.4 - 10.5 mg/dL   Total Protein 6.2  6.0 - 8.3 g/dL   Albumin 3.3 (*) 3.5 - 5.2 g/dL   AST 21  0 - 37 U/L   ALT 24  0 - 35 U/L   Alkaline Phosphatase 54  39 - 117 U/L   Total Bilirubin 0.8  0.3 - 1.2 mg/dL   GFR calc non Af Amer 58 (*) >90 mL/min   GFR calc Af Amer 67 (*) >90 mL/min   Comment: (NOTE)     The eGFR has been calculated using the CKD EPI equation.     This calculation has not been validated in all clinical situations.      eGFR's persistently <90 mL/min signify possible Chronic Kidney     Disease.  LACTIC ACID, PLASMA     Status: None  Collection Time    11/15/13  4:23 AM      Result Value Range   Lactic Acid, Venous 1.4  0.5 - 2.2 mmol/L  CBC     Status: Abnormal   Collection Time    11/15/13  7:45 AM      Result Value Range   WBC 13.2 (*) 4.0 - 10.5 K/uL   RBC 3.64 (*) 3.87 - 5.11 MIL/uL   Hemoglobin 11.5 (*) 12.0 - 15.0 g/dL   HCT 34.9 (*) 36.0 - 46.0 %   MCV 95.9  78.0 - 100.0 fL   MCH 31.6  26.0 - 34.0 pg   MCHC 33.0  30.0 - 36.0 g/dL   RDW 13.3  11.5 - 15.5 %   Platelets 330  150 - 400 K/uL  GLUCOSE, CAPILLARY     Status: Abnormal   Collection Time    11/15/13  8:03 AM      Result Value Range   Glucose-Capillary 145 (*) 70 - 99 mg/dL   Comment 1 Notify RN     Comment 2 Documented in Chart    GLUCOSE, CAPILLARY     Status: Abnormal   Collection Time    11/15/13 11:41 AM      Result Value Range   Glucose-Capillary 155 (*) 70 - 99 mg/dL   Comment 1 Notify RN     Comment 2 Documented in Chart      No results found.  ROS pertinent for her back pain currently being better and she will use periodic MiraLax for constipation and when she has bad diarrhea she'll go on a banana and peanut butter cracker diet but has no other complaint Blood pressure 120/57, pulse 74, temperature 97.9 F (36.6 C), temperature source Oral, resp. rate 20, height 5' 8" (1.727 m), weight 70.3 kg (154 lb 15.7 oz), SpO2 96.00%. Physical Exam vital signs stable afebrile no acute distress abdomen is soft nontender heart regular rate and rhythm lungs are clear BUN slight decrease from 31-25 with hydration hemoglobin minimal drop currently stable Assessment/Plan: GI bleeding currently stable in a patient on Plavix and ibuprofen Plan: I offered her an endoscopy to rule out any significant ibuprofen-induced lesion but she prefers just to go home and not take her ibuprofen and we discussed using Tylenol arthritis or possibly  some glucosamine and she should probably should be treated with pump inhibitor either long-term if she gets back on aspirin or nonsteroidal or for at least a month or two and my partner Dr. Oletta Lamas happy see back in the office when necessary or in a week or 2 with followup is warranted with [place] to recheck CBC and symptoms and make sure no further workup and plans are needed otherwise the [facility] physician could do the above as well and please call me if I can be of any further assistance this hospital stay [facility] 11/15/2013, 2:06 PM

## 2013-11-16 ENCOUNTER — Encounter: Payer: Self-pay | Admitting: Geriatric Medicine

## 2013-11-16 ENCOUNTER — Non-Acute Institutional Stay: Payer: Medicare Other | Admitting: Geriatric Medicine

## 2013-11-16 DIAGNOSIS — E1159 Type 2 diabetes mellitus with other circulatory complications: Secondary | ICD-10-CM

## 2013-11-16 DIAGNOSIS — K922 Gastrointestinal hemorrhage, unspecified: Secondary | ICD-10-CM

## 2013-11-16 DIAGNOSIS — I10 Essential (primary) hypertension: Secondary | ICD-10-CM

## 2013-11-16 NOTE — Progress Notes (Signed)
Patient ID: Kara Harris, female   DOB: [birth year], 80 y.o.   MRN: [id number] [place] (13)  Code Status: DNR      Contact Information   Name Relation Home Work Mobile   [facility]  [place] Friend          Chief Complaint  Patient presents with  . Hospitalization Follow-up    Rectal bleeding    HPI: This is a 80 y.o. female resident of [facility]. Patient was transferred to hospital on January 28 after multiple bloody bowel movements. Several days prior to admission patient reports she was taking ibuprofen several times a day for back pain. Hospital evaluation included laboratory studies. She was admitted for serial CBCs, IV PPI therapy. She was evaluated in consultation by Dr. Watt Climes, gastroenterology. They discussed endoscopy versus overnight observation, patient elected to go home. Plavix was stopped, Protonix 40 mg b.i.d. was started. One week followup with gastroenterology gwas recommended. Patient was readmitted to the Assisted Living section at [place] last evening January 29. Patient denies any further rectal bleeding, she's not had a bowel movement today. She's alittle tired otherwise at her usual state of health. She ate breakfast without GI discomfort. Vital signs stable, this morning's CBG mildly elevated at 176.   Functional status Bathing: Moderate Assist Bladder Management: Pads, Bowel Management: Continent Diet / Swallowing: PO Diet: Regular Hygiene and Grooming: Independent, Toileting / Clothing: Independent Walk: Independent w/ walker    Allergies  Allergen Reactions  . Codeine     UNKNOWN  . Tramadol     UNKNOWN  . Vesicare [Solifenacin Succinate]     UNKNOWN      Medication List       This list is accurate as of: 11/16/13 12:29 PM.  Always use your most recent med list.               acetaminophen 500 MG tablet  Commonly known as:   TYLENOL  Take 2 tablets (1,000 mg total) by mouth every 8 (eight) hours as needed for moderate pain, fever or headache (severe pain).     atenolol 25 MG tablet  Commonly known as:  TENORMIN  Take 12.5 mg by mouth every morning.     calcium-vitamin D 500-200 MG-UNIT per tablet  Commonly known as:  OSCAL WITH D  Take 1 tablet by mouth every morning.     clobetasol cream 0.05 %  Commonly known as:  TEMOVATE  Apply 1 application topically 2 (two) times daily. To red and scaly areas     loperamide 2 MG tablet  Commonly known as:  IMODIUM A-D  Take 2 mg by mouth. 1/2 to 1 tablet daily as needed for diarrhea     Melatonin 3 MG Caps  Take 3 mg by mouth at bedtime. To help with sleep     metFORMIN 500 MG tablet  Commonly known as:  GLUCOPHAGE  Take 500 mg by mouth 2 (two) times daily with a meal.     pantoprazole 40 MG tablet  Commonly known as:  PROTONIX  Take 1 tablet (40 mg total) by mouth 2 (two) times daily before a meal.     polyethylene glycol packet  Commonly known as:  MIRALAX / GLYCOLAX  Take 17 g by mouth as needed.     PREVIDENT 5000 BOOSTER 1.1 % Pste  Generic drug:  Sodium Fluoride  Place 1 application onto teeth at bedtime.  spironolactone 50 MG tablet  Commonly known as:  ALDACTONE  Take 50 mg by mouth 2 (two) times daily.     triamcinolone cream 0.1 %  Commonly known as:  KENALOG  Apply 1 application topically. Apply thin layer twice daily as needed for groin rash for no longer than 2 weeks at a time. Apply as needed for redness and itching        DATA REVIEWED  Radiologic Exams  [facility] List 2006- - Colonoscopy: nl exam 04/05/08 CT brain: normal 11/15/08 CT LS: Moderate multifactorial spinal/foraminal stenosis at L3-4, L4-5 - could cause neural compression. Foraminal narrowing on left at L5-S1- could affect L5 nerve root 08/2009: Mammogram- nl. exam 12/2009: MRA head, no contrast  CT brain, no contrast  CXR: Lungs well aerated. No focal opacities,  effusion or pntx.  09/17/2010 Mammogram Normal  10/2010: Urodynamic study (at [facility]): confirmed voiding dysfunction 12/2010 Upper Endoscopy: slight gastritis  Colonoscopy: essentially normal exam 07/20/2011 [place] Left breast negative 09/20/2011 Mammogram negative    Cardiovascular Exams  [facility] List 2004 - cardiac cath: EF 60%. Normal coronary arteries 04/26/08 2D Echo: Normal LV function/size. Marland Kitchen No embolic source, no ASD.  07/18/08 EKG: SR 74BPM. Left axis deviation. Evidence of old inferior infarct 06/2009: EKG: Sinus rhythm, HR 68BPM. Borderline LVH, poor 'r' wave progression c/w old infarct 10/22/09 Doppler of legs: No DVT. Left knee effusion. 02/2010: 2D echocardiogram: LVEF 65-70%. Abnormal LV relaxation c/w  grade 1 diastolic dysfunction. AoV with mild regurgitation   Laboratory Studies   [facility] 08/03/2012 CBC: Wbc 11.3, Rbc 4.60, Hgb 14.2, Hct 41.5, Platelet 296  HgbA1c 6.7   11/2012:  Glucose 111, the BUN 11, creatinine 0.78, sodium 136, potassium 4.3.Protein/LFTs WNL   total cholesterol 108, triglycerides 132, HDL 37, LDL 45   A1c 6.7  Lab Results- [facility] 08/2013  Component Value Date   CHOL 194 08/30/2013   TRIG 135 08/30/2013   HDL 45 08/30/2013        TSH 1.73 08/30/2013        HGBA1C 6.1* 08/30/2013    Lab Results - Hospital  11/14/2013  Component Value Date   WBC 13.2* 11/15/2013   HGB 11.5* 11/15/2013   HCT 34.9* 11/15/2013   PLT 330 11/15/2013        GLUCOSE 132* 11/15/2013   ALT 24 11/15/2013   AST 21 11/15/2013   NA 138 11/15/2013   K 4.0 11/15/2013   CL 103 11/15/2013   CREATININE 0.87 11/15/2013   BUN 25* 11/15/2013   CO2 22 11/15/2013    REVIEW OF SYSTEMS DATA OBTAINED: from patient, nurse, medical record GENERAL: Feels well   No fevers, fatigue, change in appetite or weight SKIN: No itch, rash EYES: No eye pain, dryness or itching  No change in vision EARS: No earache, tinnitus, change in hearing NOSE: No congestion, drainage or bleeding MOUTH/THROAT:  No mouth or tooth pain    No sore throat   No difficulty chewing or swallowing Recent dental work RESPIRATORY: No cough, wheezing, SOB CARDIAC: No chest pain, palpitations  Feet swell GI: No abdominal pain    No N/V/D or constipation   No heartburn or reflux   SEE HPI   GU: No dysuria, Chronic frequency, urgency , leakage  No change in urine volume or character MUSCULOSKELETAL: No joint pain, swelling or stiffness  No back pain  No muscle ache, pain, weakness  Gait is steady   No recent falls.  NEUROLOGIC: No dizziness, fainting, headache, has  occasional tingling in her toes No change in mental status.  PSYCHIATRIC: No feelings of anxiety, depression is unchanged. Sleeps okay until 5 or 6 AM, takes melatonin and sleeps till 8 or 9 AM awakes rested.   PHYSICAL EXAM  Filed Vitals:   11/16/13 1227  BP: 123/77  Pulse: 103  Temp: 97.1 F (36.2 C)  Resp: 22  SpO2: 93%   There is no weight on file to calculate BMI.  GENERAL APPEARANCE: No acute distress, appropriately groomed, normal body habitus. Alert, pleasant, conversant. SKIN: No diaphoresis,  unusual lesions, wounds.    HEAD: Normocephalic, atraumatic EYES: Conjunctiva/lids clear. Pupils round, reactive.   EARS: External exam WNL. Hearing decreased(chronic) NOSE: No deformity or discharge. MOUTH/THROAT: Lips w/o lesions. Oral mucosa, tongue moist, w/o lesion. Oropharynx w/o redness or lesions.  NECK: Supple, full ROM. No thyroid tenderness, enlargement or nodule LYMPHATICS: No head, neck or supraclavicular adenopathy RESPIRATORY: Breathing is even, unlabored. Lung sounds are clear and full.   ARTERIAL: No carotid bruit. Carotid 2+, Distal pulses absent.   VENOUS: Superificial varicosities bilateral LE. No venous stasis skin changes  EDEMA: trace peripheral edema (feet).  GASTROINTESTINAL: Abdomen is obese, soft, non-tender, not distended w/ normal bowel sounds. MUSCULOSKELETAL: Moves all extremities with full ROM, strength and tone.  Back with kyphosis, No scoliosis or spinal process tenderness. Gait is unsteady, better with walker NEUROLOGIC: Oriented to time, place, person. Cranial nerves 2-12 grossly intact, speech clear, no tremor.  PSYCHIATRIC: Mood and affect appropriate to situation  ASSESSMENT/PLAN  Essential hypertension, benign Continues to be well controlled with current medication  Type II or unspecified type diabetes mellitus with peripheral circulatory disorders, uncontrolled(250.72) Prior to acute issue, fasting CBGs were less than 150. Today mildly elevated at 176. No medication change anticipate this will restart turned to her usual range. Most recent A1c satisfactory  GI bleed GI bleed with bloody dark stools 2 days ago. No symptoms today. Hospital record shows very mild drop in hemoglobin and hematocrit. Vital signs remained stable. Plavix was started 2009 after TIA, no bleeding issues before this episode. Patient was taking ibuprofen 800mg  daily for 1 week prior to rectal bleeding. Keep Plavix on hold until after GI evalaution.   Follow up: As scheduled in [place] clinic  Kara Halbleib T.Alani Sabbagh, NP-C 11/16/2013

## 2013-11-17 NOTE — Assessment & Plan Note (Signed)
Prior to acute issue, fasting CBGs were less than 150. Today mildly elevated at 176. No medication change anticipate this will restart turned to her usual range. Most recent A1c satisfactory

## 2013-11-17 NOTE — Assessment & Plan Note (Signed)
GI bleed with bloody dark stools 2 days ago. No symptoms today. Hospital record shows very mild drop in hemoglobin and hematocrit. Vital signs remained stable. Plavix was started 2009 after TIA, no bleeding issues before this episode. Patient was taking ibuprofen 800mg  daily for 1 week prior to rectal bleeding. Keep Plavix on hold until after GI evalaution.

## 2013-11-17 NOTE — Assessment & Plan Note (Signed)
Continues to be well controlled with current medication

## 2013-11-20 LAB — CBC AND DIFFERENTIAL
HEMATOCRIT: 34 % — AB (ref 36–46)
Hemoglobin: 11 g/dL — AB (ref 12.0–16.0)
Platelets: 386 10*3/uL (ref 150–399)
WBC: 12.3 10^3/mL

## 2013-11-20 LAB — BASIC METABOLIC PANEL
BUN: 10 mg/dL (ref 4–21)
Creatinine: 0.9 mg/dL (ref 0.5–1.1)
GLUCOSE: 100 mg/dL
POTASSIUM: 4.3 mmol/L (ref 3.4–5.3)
Sodium: 138 mmol/L (ref 137–147)

## 2013-12-10 ENCOUNTER — Non-Acute Institutional Stay: Payer: Medicare Other | Admitting: Internal Medicine

## 2013-12-10 VITALS — BP 102/62 | HR 68 | Ht 68.0 in | Wt 155.0 lb

## 2013-12-10 DIAGNOSIS — K922 Gastrointestinal hemorrhage, unspecified: Secondary | ICD-10-CM

## 2013-12-10 DIAGNOSIS — D62 Acute posthemorrhagic anemia: Secondary | ICD-10-CM

## 2013-12-10 DIAGNOSIS — E1159 Type 2 diabetes mellitus with other circulatory complications: Secondary | ICD-10-CM

## 2013-12-10 NOTE — Progress Notes (Signed)
Patient ID: Kara Harris, female   DOB: 1922-10-24, 80 y.o.   MRN: [id number]    Location:  [facility] (12)    Allergies  Allergen Reactions  . Codeine     UNKNOWN  . Tramadol     UNKNOWN  . Vesicare [Solifenacin Succinate]     UNKNOWN    Chief Complaint  Patient presents with  . Medical Managment of Chronic Issues    blood pressure, blood sugar, depression    HPI:  Hospitalized 11/14/13 to 11/15/13 for blood in the stool. She is on Plavix. Was given Ibuprofen for back pains. Has a prior history of mild gastritis noted on EGD in 2012 by Dr. Oletta Lamas. Saw Dr. Oletta Lamas since hospitalized. He did not think endoscopy was needed at this time.  Type II or unspecified type diabetes mellitus with peripheral circulatory disorders, uncontrolled(250.72): controlled  Acute blood loss anemia: improved    Medications: Patient's Medications  New Prescriptions   No medications on file  Previous Medications   ACETAMINOPHEN (TYLENOL) 500 MG TABLET    Take 2 tablets (1,000 mg total) by mouth every 8 (eight) hours as needed for moderate pain, fever or headache (severe pain).   ATENOLOL (TENORMIN) 25 MG TABLET    Take 12.5 mg by mouth every morning.    CALCIUM-VITAMIN D (OSCAL WITH D) 500-200 MG-UNIT PER TABLET    Take 1 tablet by mouth every morning.    CLOBETASOL CREAM (TEMOVATE) 0.05 %    Apply 1 application topically 2 (two) times daily. To red and scaly areas   LOPERAMIDE (IMODIUM A-D) 2 MG TABLET    Take 2 mg by mouth. 1/2 to 1 tablet daily as needed for diarrhea   MELATONIN 3 MG CAPS    Take 3 mg by mouth at bedtime. To help with sleep   METFORMIN (GLUCOPHAGE) 500 MG TABLET    Take 500 mg by mouth 2 (two) times daily with a meal.   PANTOPRAZOLE (PROTONIX) 40 MG TABLET    Take 1 tablet (40 mg total) by mouth 2 (two) times daily before a meal.   POLYETHYLENE GLYCOL (MIRALAX / GLYCOLAX) PACKET    Take 17 g by mouth as needed.   SODIUM  FLUORIDE (PREVIDENT 5000 BOOSTER) 1.1 % PSTE    Place 1 application onto teeth at bedtime.   SPIRONOLACTONE (ALDACTONE) 50 MG TABLET    Take 50 mg by mouth 2 (two) times daily.   TOLTERODINE (DETROL LA) 4 MG 24 HR CAPSULE    Take 4 mg by mouth daily.   TRIAMCINOLONE CREAM (KENALOG) 0.1 %    Apply 1 application topically. Apply thin layer twice daily as needed for groin rash for no longer than 2 weeks at a time. Apply as needed for redness and itching  Modified Medications   No medications on file  Discontinued Medications   No medications on file     Review of Systems  Constitutional: Negative for fever, chills, activity change, appetite change and fatigue.  HENT: Negative.   Eyes: Negative.   Respiratory: Negative.   Cardiovascular: Negative.   Gastrointestinal:       Recent GI bleed.  Endocrine: Negative for cold intolerance, heat intolerance, polydipsia, polyphagia and polyuria.       Hx DM  Genitourinary:       Incontinence   Musculoskeletal: Positive for arthralgias, back pain and gait problem. Negative for joint swelling and myalgias.  Uses walker  Skin: Negative.   Allergic/Immunologic: Negative.   Neurological: Negative for tremors, seizures, syncope, facial asymmetry, speech difficulty, weakness, light-headedness and numbness.  Hematological: Negative.   Psychiatric/Behavioral: Positive for dysphoric mood. Negative for hallucinations, behavioral problems, confusion, self-injury, decreased concentration and agitation. The patient is not nervous/anxious and is not hyperactive.     Filed Vitals:   12/10/13 1629  BP: 102/62  Pulse: 68  Height: _0  (1.727 m)  Weight: 155 lb (70.308 kg)   Physical Exam  Constitutional: She is oriented to person, place, and time. No distress.  overweight  HENT:  Head: Normocephalic and atraumatic.  Mouth/Throat: No oropharyngeal exudate.  Eyes: Pupils are equal, round, and reactive to light.  Neck: Normal range of motion. Neck  supple. No JVD present. No tracheal deviation present. No thyromegaly present.  Cardiovascular: Normal rate, regular rhythm, normal heart sounds and intact distal pulses.  Exam reveals no gallop and no friction rub.   No murmur heard. Pulmonary/Chest: Breath sounds normal. No respiratory distress. She has no wheezes. She has no rales.  Abdominal: She exhibits no distension and no mass. There is no tenderness.  Musculoskeletal: She exhibits edema. She exhibits no tenderness.  Lymphadenopathy:    She has no cervical adenopathy.  Neurological: She is oriented to person, place, and time. She has normal reflexes. No cranial nerve deficit. Coordination normal.  Skin: Skin is warm and dry. No rash noted. She is not diaphoretic. No pallor.  Psychiatric: Her behavior is normal. Judgment and thought content normal.  Depressed acting. Frustrated with her life.     Labs reviewed: Nursing Home on 12/10/2013  Component Date Value Ref Range Status  . Hemoglobin 11/20/2013 11.0* 12.0 - 16.0 g/dL Final  . HCT 11/20/2013 34* 36 - 46 % Final  . Platelets 11/20/2013 386  150 - 399 K/L Final  . WBC 11/20/2013 12.3   Final  . Glucose 11/20/2013 100   Final  . BUN 11/20/2013 10  4 - 21 mg/dL Final  . Creatinine 11/20/2013 0.9  0.5 - 1.1 mg/dL Final  . Potassium 11/20/2013 4.3  3.4 - 5.3 mmol/L Final  . Sodium 11/20/2013 138  137 - 147 mmol/L Final  Nursing Home on 11/16/2013  Component Date Value Ref Range Status  . Hemoglobin 08/30/2013 14.3  12.0 - 16.0 g/dL Final  . HCT 08/30/2013 42  36 - 46 % Final  . Platelets 08/30/2013 326  150 - 399 K/L Final  . WBC 08/30/2013 9.6   Final  . Glucose 08/30/2013 116   Final  . BUN 08/30/2013 11  4 - 21 mg/dL Final  . Creatinine 08/30/2013 0.8  0.5 - 1.1 mg/dL Final  . Potassium 08/30/2013 4.3  3.4 - 5.3 mmol/L Final  . Sodium 08/30/2013 138  137 - 147 mmol/L Final  . Triglycerides 08/30/2013 135  40 - 160 mg/dL Final  . Cholesterol 08/30/2013 194  0 - 200  mg/dL Final  . HDL 08/30/2013 45  35 - 70 mg/dL Final  . LDL Cholesterol 08/30/2013 122   Final  . Alkaline Phosphatase 08/30/2013 61  25 - 125 U/L Final  . ALT 08/30/2013 29  7 - 35 U/L Final  . AST 08/30/2013 23  13 - 35 U/L Final  . Hemoglobin A1C 08/30/2013 6.1* 4.0 - 6.0 % Final  . TSH 08/30/2013 1.73  0.41 - 5.90 uIU/mL Final  Admission on 11/14/2013, Discharged on 11/15/2013  Component Date Value Ref Range Status  . WBC 11/14/2013  13.8* 4.0 - 10.5 K/uL Final  . RBC 11/14/2013 3.98  3.87 - 5.11 MIL/uL Final  . Hemoglobin 11/14/2013 12.7  12.0 - 15.0 g/dL Final  . HCT 11/14/2013 37.9  36.0 - 46.0 % Final  . MCV 11/14/2013 95.2  78.0 - 100.0 fL Final  . MCH 11/14/2013 31.9  26.0 - 34.0 pg Final  . MCHC 11/14/2013 33.5  30.0 - 36.0 g/dL Final  . RDW 11/14/2013 13.2  11.5 - 15.5 % Final  . Platelets 11/14/2013 364  150 - 400 K/uL Final  . Sodium 11/14/2013 136* 137 - 147 mEq/L Final  . Potassium 11/14/2013 4.7  3.7 - 5.3 mEq/L Final  . Chloride 11/14/2013 100  96 - 112 mEq/L Final  . CO2 11/14/2013 22  19 - 32 mEq/L Final  . Glucose, Bld 11/14/2013 141* 70 - 99 mg/dL Final  . BUN 11/14/2013 31* 6 - 23 mg/dL Final  . Creatinine, Ser 11/14/2013 0.90  0.50 - 1.10 mg/dL Final  . Calcium 11/14/2013 9.4  8.4 - 10.5 mg/dL Final  . Total Protein 11/14/2013 7.0  6.0 - 8.3 g/dL Final  . Albumin 11/14/2013 3.7  3.5 - 5.2 g/dL Final  . AST 11/14/2013 22  0 - 37 U/L Final  . ALT 11/14/2013 27  0 - 35 U/L Final  . Alkaline Phosphatase 11/14/2013 62  39 - 117 U/L Final  . Total Bilirubin 11/14/2013 0.5  0.3 - 1.2 mg/dL Final  . GFR calc non Af Amer 11/14/2013 55* >90 mL/min Final  . GFR calc Af Amer 11/14/2013 64* >90 mL/min Final   Comment: (NOTE)                          The eGFR has been calculated using the CKD EPI equation.                          This calculation has not been validated in all clinical situations.                          eGFR's persistently <90 mL/min signify possible  Chronic Kidney                          Disease.  Marland Kitchen Prothrombin Time 11/14/2013 13.9  11.6 - 15.2 seconds Final  . INR 11/14/2013 1.09  0.00 - 1.49 Final  . Lipase 11/14/2013 23  11 - 59 U/L Final  . ABO/RH(D) 11/14/2013 O POS   Final  . Antibody Screen 11/14/2013 NEG   Final  . Sample Expiration 11/14/2013 11/17/2013   Final  . Color, Urine 11/14/2013 YELLOW  YELLOW Final  . APPearance 11/14/2013 CLEAR  CLEAR Final  . Specific Gravity, Urine 11/14/2013 1.008  1.005 - 1.030 Final  . pH 11/14/2013 7.0  5.0 - 8.0 Final  . Glucose, UA 11/14/2013 NEGATIVE  NEGATIVE mg/dL Final  . Hgb urine dipstick 11/14/2013 MODERATE* NEGATIVE Final  . Bilirubin Urine 11/14/2013 NEGATIVE  NEGATIVE Final  . Ketones, ur 11/14/2013 NEGATIVE  NEGATIVE mg/dL Final  . Protein, ur 11/14/2013 NEGATIVE  NEGATIVE mg/dL Final  . Urobilinogen, UA 11/14/2013 0.2  0.0 - 1.0 mg/dL Final  . Nitrite 11/14/2013 NEGATIVE  NEGATIVE Final  . Leukocytes, UA 11/14/2013 SMALL* NEGATIVE Final  . ABO/RH(D) 11/14/2013 O POS   Final  . WBC, UA 11/14/2013 3-6  <3 WBC/hpf  Final  . RBC / HPF 11/14/2013 0-2  <3 RBC/hpf Final  . WBC 11/15/2013 12.6* 4.0 - 10.5 K/uL Final  . RBC 11/15/2013 3.57* 3.87 - 5.11 MIL/uL Final  . Hemoglobin 11/15/2013 11.3* 12.0 - 15.0 g/dL Final  . HCT 11/15/2013 34.3* 36.0 - 46.0 % Final  . MCV 11/15/2013 96.1  78.0 - 100.0 fL Final  . MCH 11/15/2013 31.7  26.0 - 34.0 pg Final  . MCHC 11/15/2013 32.9  30.0 - 36.0 g/dL Final  . RDW 11/15/2013 13.3  11.5 - 15.5 % Final  . Platelets 11/15/2013 320  150 - 400 K/uL Final  . Sodium 11/15/2013 138  137 - 147 mEq/L Final  . Potassium 11/15/2013 4.0  3.7 - 5.3 mEq/L Final  . Chloride 11/15/2013 103  96 - 112 mEq/L Final  . CO2 11/15/2013 22  19 - 32 mEq/L Final  . Glucose, Bld 11/15/2013 132* 70 - 99 mg/dL Final  . BUN 11/15/2013 25* 6 - 23 mg/dL Final  . Creatinine, Ser 11/15/2013 0.87  0.50 - 1.10 mg/dL Final  . Calcium 11/15/2013 8.8  8.4 - 10.5 mg/dL Final    . Total Protein 11/15/2013 6.2  6.0 - 8.3 g/dL Final  . Albumin 11/15/2013 3.3* 3.5 - 5.2 g/dL Final  . AST 11/15/2013 21  0 - 37 U/L Final  . ALT 11/15/2013 24  0 - 35 U/L Final  . Alkaline Phosphatase 11/15/2013 54  39 - 117 U/L Final  . Total Bilirubin 11/15/2013 0.8  0.3 - 1.2 mg/dL Final  . GFR calc non Af Amer 11/15/2013 58* >90 mL/min Final  . GFR calc Af Amer 11/15/2013 67* >90 mL/min Final   Comment: (NOTE)                          The eGFR has been calculated using the CKD EPI equation.                          This calculation has not been validated in all clinical situations.                          eGFR's persistently <90 mL/min signify possible Chronic Kidney                          Disease.  . Lactic Acid, Venous 11/15/2013 1.4  0.5 - 2.2 mmol/L Final  . Glucose-Capillary 11/15/2013 110* 70 - 99 mg/dL Final  . MRSA by PCR 11/15/2013 NEGATIVE  NEGATIVE Final   Comment:                                 The GeneXpert MRSA Assay (FDA                          approved for NASAL specimens                          only), is one component of a                          comprehensive MRSA colonization  surveillance program. It is not                          intended to diagnose MRSA                          infection nor to guide or                          monitor treatment for                          MRSA infections.  . WBC 11/15/2013 13.2* 4.0 - 10.5 K/uL Final  . RBC 11/15/2013 3.64* 3.87 - 5.11 MIL/uL Final  . Hemoglobin 11/15/2013 11.5* 12.0 - 15.0 g/dL Final  . HCT 11/15/2013 34.9* 36.0 - 46.0 % Final  . MCV 11/15/2013 95.9  78.0 - 100.0 fL Final  . MCH 11/15/2013 31.6  26.0 - 34.0 pg Final  . MCHC 11/15/2013 33.0  30.0 - 36.0 g/dL Final  . RDW 11/15/2013 13.3  11.5 - 15.5 % Final  . Platelets 11/15/2013 330  150 - 400 K/uL Final  . Glucose-Capillary 11/15/2013 145* 70 - 99 mg/dL Final  . Comment 1 11/15/2013 Notify RN   Final  . Comment 2  11/15/2013 Documented in Chart   Final  . Glucose-Capillary 11/15/2013 155* 70 - 99 mg/dL Final  . Comment 1 11/15/2013 Notify RN   Final  . Comment 2 11/15/2013 Documented in Chart   Final  . Fecal Occult Bld 11/14/2013 POSITIVE* NEGATIVE Final      Assessment/Plan  GI bleed: resolve. Avoid NSAIDs. Continue Plavix.  Type II or unspecified type diabetes mellitus with peripheral circulatory disorders, uncontrolled(250.72)  Acute blood loss anemia: improved

## 2014-02-27 ENCOUNTER — Encounter: Payer: Self-pay | Admitting: Geriatric Medicine

## 2014-02-27 ENCOUNTER — Non-Acute Institutional Stay: Payer: Medicare Other | Admitting: Geriatric Medicine

## 2014-02-27 VITALS — BP 110/70 | HR 80 | Wt 154.0 lb

## 2014-02-27 DIAGNOSIS — I10 Essential (primary) hypertension: Secondary | ICD-10-CM

## 2014-02-27 DIAGNOSIS — K922 Gastrointestinal hemorrhage, unspecified: Secondary | ICD-10-CM

## 2014-02-27 DIAGNOSIS — E1159 Type 2 diabetes mellitus with other circulatory complications: Secondary | ICD-10-CM

## 2014-02-27 DIAGNOSIS — I739 Peripheral vascular disease, unspecified: Secondary | ICD-10-CM

## 2014-02-27 DIAGNOSIS — Z0000 Encounter for general adult medical examination without abnormal findings: Secondary | ICD-10-CM

## 2014-02-27 DIAGNOSIS — Z8679 Personal history of other diseases of the circulatory system: Secondary | ICD-10-CM

## 2014-02-27 MED ORDER — CLOPIDOGREL BISULFATE 75 MG PO TABS: 75.0000 mg | ORAL_TABLET | Freq: Every day | ORAL | Status: DC

## 2014-02-27 NOTE — Assessment & Plan Note (Signed)
Patient sent self reports increased forgetfulness, during interview today she does have some difficulty recalling events. Will ask nursing staff to repeat MMSE/clock test. Last was in June 2014 without significant deficit noted.   Patient agreed to undergo pharmacogenetic testing today. Once results are received, will contact patient if medication adjustments are necessary

## 2014-02-27 NOTE — Assessment & Plan Note (Addendum)
Patient requested CBG testing he got only once a week, last 2 weeks CBG 148, 139; satisfactory. Continue current medication, repeat A1c June 2015

## 2014-02-27 NOTE — Assessment & Plan Note (Signed)
Exam is unchanged, the patient was previously receiving Plavix 22 history of TIA and peripheral vascular disease. Resume this medication

## 2014-02-27 NOTE — Progress Notes (Signed)
Patient ID: Kara Harris, female   DOB: [birth year], 80 y.o.   MRN: [id number] [facility] [phone])  Code Status: DNR      Contact Information   Name Relation Home Work Mobile   [facility]  [place] Friend          Chief Complaint  Patient presents with  . Medical Management of Chronic Issues    blood pressure, blood sugar, depression, anemia    HPI: This is a 80 y.o. female resident of [facility] evaluated today for management of ongoing medical issues.   Last visit: Essential hypertension, benign Continues to be well controlled with current medication  Type II or unspecified type diabetes mellitus with peripheral circulatory disorders, uncontrolled(250.72) Prior to acute issue, fasting CBGs were less than 150. Today mildly elevated at 176. No medication change anticipate this will restart turned to her usual range. Most recent A1c satisfactory  GI bleed GI bleed with bloody dark stools 2 days ago. No symptoms today. Hospital record shows very mild drop in hemoglobin and hematocrit. Vital signs remained stable. Plavix was started 2009 after TIA, no bleeding issues before this episode. Patient was taking ibuprofen 800mg  daily for 1 week prior to rectal bleeding. Keep Plavix on hold until after GI evalaution.   Since last visit no acute medical issues. Review of facility record shows stable VS, weight, CBG.  Recent dental evaluation, had extraction left lower. Needs more attention right upper.  Return to Dr. Oletta Lamas in February 2015, he requested guaiac stool testing every 2 weeks x3. All tests were negative for blood. Patient has not been restarted on Plavix.    Allergies  Allergen Reactions  . Codeine     UNKNOWN  . Tramadol     UNKNOWN  . Vesicare [Solifenacin Succinate]     UNKNOWN    MEDICATION - Reviewed  DATA REVIEWED  Radiologic Exams   Laboratory  Studies   [facility] 11/2012:  Glucose 111, the BUN 11, creatinine 0.78, sodium 136, potassium 4.3.Protein/LFTs WNL   total cholesterol 108, triglycerides 132, HDL 37, LDL 45   A1c 6.7   Lab Results  Component Value Date   WBC 12.3 11/20/2013   HGB 11.0* 11/20/2013   HCT 34* 11/20/2013   PLT 386 11/20/2013   Lab Results  Component Value Date   NA 138 11/20/2013   K 4.3 11/20/2013   GLU 100 11/20/2013   BUN 10 11/20/2013   CREATININE 0.9 11/20/2013    Lab Results  Component Value Date   HGBA1C 6.1* 08/30/2013     REVIEW OF SYSTEMS DATA OBTAINED: from patient, nurse, medical record GENERAL: Feels "OK"   No fevers, fatigue, change in appetite or weight SKIN: No itch, rash EYES: No eye pain, dryness or itching  No change in vision EARS: No earache, tinnitus, change in hearing NOSE: No congestion, drainage or bleeding MOUTH/THROAT: Mouth discomfort with chewing, dental work in progress No sore throat   No difficulty swallowing  RESPIRATORY: No cough, wheezing, SOB CARDIAC: No chest pain, palpitations  Feet swell GI: No abdominal pain    No N/V/D or constipation   No heartburn or reflux    GU: No dysuria, Chronic frequency, urgency , leakage  No change in urine volume or character MUSCULOSKELETAL: No joint pain, swelling or stiffness  No back pain  No muscle ache, pain, weakness  Gait is steady   No recent falls.  NEUROLOGIC:  No dizziness, fainting, headache, has occasional tingling in her toe.  Patient remarks she is more forgetful   PSYCHIATRIC: No feelings of anxiety, depression is unchanged.    PHYSICAL EXAM  Filed Vitals:   02/27/14 1116  BP: 110/70  Pulse: 80  Weight: 154 lb (69.854 kg)   Body mass index is 23.42 kg/(m^2).  GENERAL APPEARANCE: No acute distress, appropriately groomed, normal body habitus. Alert, pleasant, conversant. SKIN: No diaphoresis,  unusual lesions, wounds.    HEAD: Normocephalic, atraumatic EYES: Conjunctiva/lids clear. Pupils round, reactive.   EARS:  External exam WNL. Hearing decreased(chronic) NOSE: No deformity or discharge. MOUTH/THROAT: Lips w/o lesions. Oral mucosa, tongue moist, w/o lesion. Oropharynx w/o redness or lesions.  RESPIRATORY: Breathing is even, unlabored. Lung sounds are clear and full.   ARTERIAL: No carotid bruit. Carotid 2+, Distal pulses absent.   VENOUS: Superificial varicosities bilateral LE. No venous stasis skin changes  EDEMA: trace peripheral edema (feet).  GASTROINTESTINAL: Abdomen is obese, soft, non-tender, not distended w/ normal bowel sounds. MUSCULOSKELETAL: Moves all extremities with full ROM, strength and tone. Back with kyphosis, No scoliosis or spinal process tenderness. Gait is unsteady, better with walker NEUROLOGIC: Oriented to time, place, person. Cranial nerves 2-12 grossly intact, speech clear, no tremor. Has difficulty recalling recent events PSYCHIATRIC: Mood and affect appropriate to situation  ASSESSMENT/PLAN  Essential hypertension, benign Recent blood pressure ranged satisfactory; 106-127/62 and 87, pulse 69-87. Continue current medication, update lab at interval  Type II or unspecified type diabetes mellitus with peripheral circulatory disorders, uncontrolled(250.72) Patient requested CBG testing he got only once a week, last 2 weeks CBG 148, 139; satisfactory. Continue current medication, repeat A1c June 2015  Peripheral vascular disease, unspecified Exam is unchanged, the patient was previously receiving Plavix 22 history of TIA and peripheral vascular disease. Resume this medication  GI bleed Resolved.  TRANSIENT ISCHEMIC ATTACKS, HX OF Prior to acute rectal bleed episode in January 2015 patient was taking Plavix as preventive treatment related to TIA and PVD. Bleeding likely caused by combination of Plavix and ibuprofen. Followup stool guaiac testing negative for bleeding.. resume Plavix. Repeat CBC in one month  Healthcare maintenance Patient sent self reports increased  forgetfulness, during interview today she does have some difficulty recalling events. Will ask nursing staff to repeat MMSE/clock test. Last was in June 2014 without significant deficit noted.   Patient agreed to undergo pharmacogenetic testing today. Once results are received, will contact patient if medication adjustments are necessary     Follow up:  Return in about 3 months (around 05/30/2014) for OV, DM2, Memory, BP, anemia.   Mardene Celeste, NP-C [place] [phone]  02/27/2014

## 2014-02-27 NOTE — Assessment & Plan Note (Signed)
Prior to acute rectal bleed episode in January 2015 patient was taking Plavix as preventive treatment related to TIA and PVD. Bleeding likely caused by combination of Plavix and ibuprofen. Followup stool guaiac testing negative for bleeding.. resume Plavix. Repeat CBC in one month

## 2014-02-27 NOTE — Assessment & Plan Note (Signed)
Resolved

## 2014-02-27 NOTE — Assessment & Plan Note (Signed)
Recent blood pressure ranged satisfactory; 106-127/62 and 87, pulse 69-87. Continue current medication, update lab at interval

## 2014-04-01 ENCOUNTER — Encounter: Payer: Self-pay | Admitting: Internal Medicine

## 2014-04-01 LAB — BASIC METABOLIC PANEL
BUN: 11 mg/dL (ref 4–21)
CREATININE: 0.8 mg/dL (ref 0.5–1.1)
Glucose: 119 mg/dL
Potassium: 4.3 mmol/L (ref 3.4–5.3)
Sodium: 136 mmol/L — AB (ref 137–147)

## 2014-04-01 LAB — CBC AND DIFFERENTIAL
HCT: 43 % (ref 36–46)
Hemoglobin: 14.7 g/dL (ref 12.0–16.0)
Platelets: 362 10*3/uL (ref 150–399)
WBC: 10.6 10*3/mL

## 2014-04-01 LAB — HEMOGLOBIN A1C: Hgb A1c MFr Bld: 6.4 % — AB (ref 4.0–6.0)

## 2014-05-29 ENCOUNTER — Non-Acute Institutional Stay: Payer: Medicare Other | Admitting: Nurse Practitioner

## 2014-05-29 ENCOUNTER — Encounter: Payer: Self-pay | Admitting: Nurse Practitioner

## 2014-05-29 VITALS — BP 122/62 | HR 64 | Wt 157.0 lb

## 2014-05-29 DIAGNOSIS — I10 Essential (primary) hypertension: Secondary | ICD-10-CM

## 2014-05-29 DIAGNOSIS — I739 Peripheral vascular disease, unspecified: Secondary | ICD-10-CM

## 2014-05-29 DIAGNOSIS — R609 Edema, unspecified: Secondary | ICD-10-CM

## 2014-05-29 DIAGNOSIS — E1159 Type 2 diabetes mellitus with other circulatory complications: Secondary | ICD-10-CM

## 2014-05-29 DIAGNOSIS — L408 Other psoriasis: Secondary | ICD-10-CM

## 2014-05-29 DIAGNOSIS — L409 Psoriasis, unspecified: Secondary | ICD-10-CM

## 2014-05-29 NOTE — Progress Notes (Signed)
Patient ID: Kara Harris, female   DOB: Dec 29, 1922, 80 y.o.   MRN: [id number]    Patient ID: Kara Harris, female   DOB: [birth year], 80 y.o.   MRN: [id number] [facility] [id number])  Code Status: DNR  Contact Information   Name Relation Home Work Mobile   [facility]  [place] Friend          Chief Complaint  Patient presents with  . Medical Management of Chronic Issues    blood pressure, blood sugar, depression, memory, anemia    HPI: This is a 80 y.o. female resident of [facility] evaluated today for management of ongoing medical issues.  Occasionally will have irritable bowel. Has been going to GI for many years.  Since last visit no acute medical issues. But has had to have multiple teeth pulled, still following with dentist.    Allergies  Allergen Reactions  . Codeine     UNKNOWN  . Tramadol     UNKNOWN  . Vesicare [Solifenacin Succinate]     UNKNOWN    MEDICATION  Current Outpatient Prescriptions on File Prior to Visit  Medication Sig Dispense Refill  . acetaminophen (TYLENOL) 500 MG tablet Take 1,000 mg by mouth every 6 (six) hours as needed for moderate pain, fever or headache (severe pain).      Marland Kitchen atenolol (TENORMIN) 25 MG tablet Take 12.5 mg by mouth every morning.       . calcium-vitamin D (OSCAL WITH D) 500-200 MG-UNIT per tablet Take 1 tablet by mouth every morning.       . clobetasol cream (TEMOVATE) 5.28 % Apply 1 application topically 2 (two) times daily. To red and scaly areas      . clopidogrel (PLAVIX) 75 MG tablet Take 1 tablet (75 mg total) by mouth daily.  90 tablet  3  . loperamide (IMODIUM A-D) 2 MG tablet Take 2 mg by mouth. 1/2 to 1 tablet daily as needed for diarrhea      . Melatonin 3 MG CAPS Take 6 mg by mouth at bedtime. To help with sleep      . metFORMIN (GLUCOPHAGE) 500 MG tablet Take 500 mg by mouth 2 (two) times daily with a meal.      .  pantoprazole (PROTONIX) 40 MG tablet Take 1 tablet (40 mg total) by mouth 2 (two) times daily before a meal.  60 tablet  0  . polyethylene glycol (MIRALAX / GLYCOLAX) packet Take 17 g by mouth as needed.      . Sodium Fluoride (PREVIDENT 5000 BOOSTER) 1.1 % PSTE Place 1 application onto teeth at bedtime.      Marland Kitchen spironolactone (ALDACTONE) 50 MG tablet Take 50 mg by mouth 2 (two) times daily.      Marland Kitchen tolterodine (DETROL LA) 4 MG 24 hr capsule Take 4 mg by mouth daily.      Marland Kitchen triamcinolone cream (KENALOG) 0.1 % Apply 1 application topically. Apply thin layer twice daily as needed for groin rash for no longer than 2 weeks at a time. Apply as needed for redness and itching       No current facility-administered medications on file prior to visit.    DATA REVIEWED  Radiologic Exams   Laboratory Studies   [facility] 11/2012:  Glucose 111, the BUN 11, creatinine 0.78, sodium 136, potassium 4.3.Protein/LFTs WNL   total cholesterol 108, triglycerides 132, HDL 37, LDL 45   A1c 6.7  Lab Results  Component Value Date   WBC 12.3 11/20/2013   HGB 11.0* 11/20/2013   HCT 34* 11/20/2013   PLT 386 11/20/2013   Lab Results  Component Value Date   NA 136* 04/01/2014   K 4.3 04/01/2014   GLU 119 04/01/2014   BUN 11 04/01/2014   CREATININE 0.8 04/01/2014    Lab Results  Component Value Date   HGBA1C 6.4* 04/01/2014     REVIEW OF SYSTEMS DATA OBTAINED: from patient GENERAL:  No fevers, fatigue, change in appetite or weight SKIN: No itch, rash EYES: No eye pain, dryness or itching  No change in vision EARS: No earache, tinnitus, change in hearing NOSE: No congestion, drainage or bleeding MOUTH/THROAT: conts with dental work. No sore throat   No difficulty swallowing  RESPIRATORY: No cough, wheezing, SOB CARDIAC: No chest pain, palpitations  Feet swell GI: No abdominal pain    No N/V/D or constipation   No heartburn or reflux    GU: No dysuria, Chronic frequency, urgency , leakage   MUSCULOSKELETAL:  No joint pain, swelling or stiffness  No back pain  No muscle ache, pain, weakness  Gait is steady   No recent falls.  NEUROLOGIC: No dizziness, fainting, headache, has occasional tingling in her toe.  Patient remarks she is more forgetful   PSYCHIATRIC: No feelings of anxiety, depression is unchanged reports she is unhappy about her living situation.    PHYSICAL EXAM  Filed Vitals:   05/29/14 1406  BP: 122/62  Pulse: 64  Weight: 157 lb (71.215 kg)   Body mass index is 23.88 kg/(m^2).  GENERAL APPEARANCE: No acute distress, appropriately groomed, normal body habitus. Alert, pleasant, conversant. SKIN: No diaphoresis,  unusual lesions, wounds.    HEAD: Normocephalic, atraumatic EYES: Conjunctiva/lids clear. Pupils round, reactive.   EARS: External exam WNL. Hearing decreased(chronic) NOSE: No deformity or discharge. MOUTH/THROAT: Lips w/o lesions. Oral mucosa, tongue moist, w/o lesion. Oropharynx w/o redness or lesions.  RESPIRATORY: Breathing is even, unlabored. Lung sounds are CTA  VENOUS: Superificial varicosities bilateral LE. No venous stasis skin changes  EDEMA: trace peripheral edema (feet).  GASTROINTESTINAL: Abdomen is soft, non-tender, not distended w/ normal bowel sounds.  MUSCULOSKELETAL: Moves all extremities with full ROM, strength and tone. Back with kyphosis, uses walker or WC NEUROLOGIC: Oriented to time, place, person. Cranial nerves 2-12 grossly intact, speech clear, no tremor. Has difficulty recalling recent events PSYCHIATRIC: Mood and affect appropriate to situation  ASSESSMENT/PLAN   1. Essential hypertension, benign Patients hypertension is stable; continue current regimen. Will monitor and make changes as necessary.  2. Type II or unspecified type diabetes mellitus with peripheral circulatory disorders, uncontrolled(250.72) Blood sugars range from 120s-140s. Will cont medication and follow up bmp prior to next visit   3. Edema Unchanged, conts  medication  4. Psoriasis Stable, conts cream as needed  5. Peripheral vascular disease, unspecified -receiving Plavix due to history of TIA and peripheral vascular disease. conts on medication, will follow up cbc before next visit.

## 2014-07-22 ENCOUNTER — Non-Acute Institutional Stay: Payer: Medicare Other | Admitting: Internal Medicine

## 2014-07-22 ENCOUNTER — Encounter: Payer: Self-pay | Admitting: Internal Medicine

## 2014-07-22 VITALS — BP 130/72 | HR 84 | Temp 97.9°F | Wt 157.0 lb

## 2014-07-22 DIAGNOSIS — M25572 Pain in left ankle and joints of left foot: Secondary | ICD-10-CM

## 2014-07-22 DIAGNOSIS — M25579 Pain in unspecified ankle and joints of unspecified foot: Secondary | ICD-10-CM | POA: Insufficient documentation

## 2014-07-22 NOTE — Progress Notes (Signed)
Patient ID: Kara Harris, female   DOB: 1923-08-21, 80 y.o.   MRN: [id number]    [name] Room Number: 321  Place of Service: Clinic (12)     Allergies  Allergen Reactions  . Codeine     UNKNOWN  . Tramadol     UNKNOWN  . Vesicare [Solifenacin Succinate]     UNKNOWN    Chief Complaint  Patient presents with  . Foot Pain    started last night    HPI:  Pain left foot dorsum started 07/21/14. No known injury.   Medications: Patient's Medications  New Prescriptions   No medications on file  Previous Medications   ACETAMINOPHEN (TYLENOL) 500 MG TABLET    Take 1,000 mg by mouth every 4 (four) hours as needed for moderate pain, fever or headache (severe pain).    ANTISEPTIC ORAL RINSE (BIOTENE) LIQD    15 mLs by Mouth Rinse route. Rinse one time per day   ATENOLOL (TENORMIN) 25 MG TABLET    Take 12.5 mg by mouth every morning.    CALCIUM-VITAMIN D (OSCAL WITH D) 500-200 MG-UNIT PER TABLET    Take 1 tablet by mouth every morning.    CLOBETASOL CREAM (TEMOVATE) 0.05 %    Apply 1 application topically 2 (two) times daily. To red and scaly areas   CLOPIDOGREL (PLAVIX) 75 MG TABLET    Take 1 tablet (75 mg total) by mouth daily.   LOPERAMIDE (IMODIUM A-D) 2 MG TABLET    Take 2 mg by mouth. 1/2 to 1 tablet daily as needed for diarrhea   MELATONIN 3 MG CAPS    Take 6 mg by mouth at bedtime. To help with sleep   METFORMIN (GLUCOPHAGE) 500 MG TABLET    Take 500 mg by mouth 2 (two) times daily with a meal.   PANTOPRAZOLE (PROTONIX) 40 MG TABLET    Take 1 tablet (40 mg total) by mouth 2 (two) times daily before a meal.   POLYETHYLENE GLYCOL (MIRALAX / GLYCOLAX) PACKET    Take 17 g by mouth as needed.   SODIUM FLUORIDE (PREVIDENT 5000 BOOSTER) 1.1 % PSTE    Place 1 application onto teeth at bedtime.   SPIRONOLACTONE (ALDACTONE) 50 MG TABLET    Take 50 mg by mouth 2 (two) times daily.   TOLTERODINE (DETROL LA) 4 MG 24 HR CAPSULE    Take 4 mg by  mouth daily.   TRIAMCINOLONE CREAM (KENALOG) 0.1 %    Apply 1 application topically. Apply thin layer twice daily as needed for groin rash for no longer than 2 weeks at a time. Apply as needed for redness and itching  Modified Medications   No medications on file  Discontinued Medications   No medications on file     Review of Systems  Constitutional: Negative for fever, chills, activity change, appetite change and fatigue.  HENT: Negative.   Eyes: Negative.   Respiratory: Negative.   Cardiovascular: Negative.   Gastrointestinal:       Recent GI bleed.  Endocrine: Negative for cold intolerance, heat intolerance, polydipsia, polyphagia and polyuria.       Hx DM  Genitourinary:       Incontinence   Musculoskeletal: Positive for back pain, arthralgias and gait problem. Negative for myalgias and joint swelling.       Uses walker. Tender mid left foot. No erythema..  Skin: Negative.   Allergic/Immunologic: Negative.   Neurological: Negative for tremors, seizures, syncope, facial  asymmetry, speech difficulty, weakness, light-headedness and numbness.  Hematological: Negative.   Psychiatric/Behavioral: Positive for dysphoric mood. Negative for hallucinations, behavioral problems, confusion, self-injury, decreased concentration and agitation. The patient is not nervous/anxious and is not hyperactive.     Filed Vitals:   07/22/14 1433  BP: 130/72  Pulse: 84  Temp: 97.9 F (36.6 C)  TempSrc: Oral  Weight: 157 lb (71.215 kg)   Body mass index is 23.88 kg/(m^2).  Physical Exam  Constitutional: She is oriented to person, place, and time. No distress.  overweight  HENT:  Head: Normocephalic and atraumatic.  Mouth/Throat: No oropharyngeal exudate.  Eyes: Pupils are equal, round, and reactive to light.  Neck: Normal range of motion. Neck supple. No JVD present. No tracheal deviation present. No thyromegaly present.  Cardiovascular: Normal rate, regular rhythm, normal heart sounds and  intact distal pulses.  Exam reveals no gallop and no friction rub.   No murmur heard. Pulmonary/Chest: Breath sounds normal. No respiratory distress. She has no wheezes. She has no rales.  Abdominal: She exhibits no distension and no mass. There is no tenderness.  Musculoskeletal: She exhibits edema and tenderness (left mid foot).  Lymphadenopathy:    She has no cervical adenopathy.  Neurological: She is oriented to person, place, and time. She has normal reflexes. No cranial nerve deficit. Coordination normal.  Skin: Skin is warm and dry. No rash noted. She is not diaphoretic. No pallor.  Psychiatric: Her behavior is normal. Judgment and thought content normal.  Depressed acting. Frustrated with her life.     Labs reviewed: Nursing Home on 05/29/2014  Component Date Value Ref Range Status  . Hemoglobin 04/01/2014 14.7  12.0 - 16.0 g/dL Final  . HCT 04/01/2014 43  36 - 46 % Final  . Platelets 04/01/2014 362  150 - 399 K/L Final  . WBC 04/01/2014 10.6   Final  . Glucose 04/01/2014 119   Final  . BUN 04/01/2014 11  4 - 21 mg/dL Final  . Creatinine 04/01/2014 0.8  0.5 - 1.1 mg/dL Final  . Potassium 04/01/2014 4.3  3.4 - 5.3 mmol/L Final  . Sodium 04/01/2014 136* 137 - 147 mmol/L Final  . Hemoglobin A1C 04/01/2014 6.4* 4.0 - 6.0 % Final     Assessment/Plan 1. Pain in joint, ankle and foot, left Ordered xray of th left foot and uric acid level

## 2014-07-23 ENCOUNTER — Other Ambulatory Visit: Payer: Self-pay | Admitting: Nurse Practitioner

## 2014-08-29 LAB — BASIC METABOLIC PANEL
BUN: 10 mg/dL (ref 4–21)
CREATININE: 0.8 mg/dL (ref 0.5–1.1)
GLUCOSE: 114 mg/dL
Potassium: 4.3 mmol/L (ref 3.4–5.3)
Sodium: 135 mmol/L — AB (ref 137–147)

## 2014-08-29 LAB — CBC AND DIFFERENTIAL
HCT: 45 % (ref 36–46)
HEMOGLOBIN: 14.8 g/dL (ref 12.0–16.0)
Platelets: 384 10*3/uL (ref 150–399)
WBC: 9.5 10^3/mL

## 2014-08-29 LAB — HEMOGLOBIN A1C: HEMOGLOBIN A1C: 6.2 % — AB (ref 4.0–6.0)

## 2014-09-02 ENCOUNTER — Non-Acute Institutional Stay: Payer: Medicare Other | Admitting: Internal Medicine

## 2014-09-02 ENCOUNTER — Encounter: Payer: Self-pay | Admitting: Internal Medicine

## 2014-09-02 VITALS — BP 110/64 | HR 64 | Temp 97.7°F | Ht 68.0 in | Wt 153.0 lb

## 2014-09-02 DIAGNOSIS — Z418 Encounter for other procedures for purposes other than remedying health state: Secondary | ICD-10-CM

## 2014-09-02 DIAGNOSIS — R609 Edema, unspecified: Secondary | ICD-10-CM

## 2014-09-02 DIAGNOSIS — K589 Irritable bowel syndrome without diarrhea: Secondary | ICD-10-CM

## 2014-09-02 DIAGNOSIS — I10 Essential (primary) hypertension: Secondary | ICD-10-CM

## 2014-09-02 DIAGNOSIS — Z299 Encounter for prophylactic measures, unspecified: Secondary | ICD-10-CM

## 2014-09-02 NOTE — Progress Notes (Signed)
Patient ID: Kara Harris, female   DOB: May 05, 1923, 80 y.o.   MRN: [id number]    HISTORY AND PHYSICAL  Location:  [facility] Room Number: 812 Place of Service: Clinic (12)   Extended Emergency Contact Information Primary Emergency Contact: [facility]  [place] Phone: [phone] Relation: None Secondary Emergency Contact: Mallard,Alan  [place] Relation: Friend    Advanced Directive information Does patient have an advance directive?: Yes, Type of Advance Directive: Living will;Out of facility DNR (pink MOST or yellow form), Pre-existing out of facility DNR order (yellow form or pink MOST form): Yellow form placed in chart (order not valid for inpatient use)    Chief Complaint  Patient presents with  . Annual Exam    Comprehensive Exam: blood pressure, blood sugar, depression, anemia, memory    HPI:  Episode of increased confusion in the last  that she thinks indicated she had a stroke. Lasted a few minutes. No residual problems. Her mother and 3 brothers died of a stroke.  Essential hypertension, benign: controlled  Edema: unchanged. [place]al and mild  Irritable bowel syndrome: chronic intermittent diarrhea.  Preventive measure - Plan: DNR (Do Not Resuscitate)    Past Medical History  Diagnosis Date  . Hyperlipidemia   . Neuropathy   . Paroxysmal atrial tachycardia   . Diabetes mellitus   . Venous insufficiency   . Anxiety   . Depressive disorder   . Plantar fascial fibromatosis   . Insomnia   . Dysphagia   . TIA (transient ischemic attack)   . Rickets, active   . Hypertension   . Basilar artery syndrome   . Other atopic dermatitis and related conditions 12/13/2012  . Diarrhea 12/13/2012  . Peripheral vascular disease, unspecified 08/14/2012  . Fecal smearing 04/05/2012  . Unspecified hereditary and idiopathic peripheral neuropathy 03/17/2011  . Macular degeneration (senile)  of retina, unspecified 03/17/2011  . Rash and other nonspecific skin eruption 12/21/2010  . Closed fracture of intertrochanteric section of femur 03/04/2010  . Spinal stenosis, unspecified region other than cervical 11/18/2008  . Pain in limb 11/18/2008  . Abnormality of gait 11/18/2008  . Pain in joint, pelvic region and thigh 10/2008  . Pain in joint, ankle and foot 08/07/2008  . Unspecified urinary incontinence 05/13/2008  . Corns and callosities 02/05/2008  . Vitamin D deficiency 11/07/2007  . Edema 10/25/2007  . Transient ischemic attack (TIA), and cerebral infarction without residual deficits(V12.54) 10/28/2002  . Type II or unspecified type diabetes mellitus with peripheral circulatory disorders, uncontrolled(250.72) 02/12/2013    Past Surgical History  Procedure Laterality Date  . Hip fracture surgery Right 02/2010  . Cholecystectomy  1964  . Tonsillectomy and adenoidectomy      as a child  . Breast mass excision    . Incision / drainage hand / finger  2001    cat bite  . Dilation and curettage of uterus  2003    post menopausal bleeding. Bicornate uterus/doble cervix, benign path  . Colonoscopy  12/18/2010    Dr Oletta Lamas  . Esophagogastroduodenoscopy endoscopy  12/18/2010    Dr. Oletta Lamas slight gastritis    Patient Care Team: Estill Dooms, MD as PCP - General (Internal Medicine) [facility] Winfield Cunas., MD as Consulting Physician (Gastroenterology) Lorretta Harp, MD as Consulting Physician (Cardiology) Antony Contras, MD as Consulting Physician (Neurology) Reece Packer, MD as Consulting Physician (Urology) Pedro Earls, MD as Attending Physician (Family  Medicine)  History   Social History  . Marital Status: Widowed    Spouse Name: N/A    Number of Children: N/A  . Years of Education: N/A   Occupational History  . Not on file.   Social History Main Topics  . Smoking status: Never Smoker   . Smokeless tobacco: Never Used  . Alcohol Use: No   . Drug Use: No  . Sexual Activity: No   Other Topics Concern  . Not on file   Social History Narrative   Patient is Widowed. No children. 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 She has never used smokeless tobacco. She reports that she does not drink alcohol or use illicit drugs.  Family History  Problem Relation Age of Onset  . Diabetes Brother   . Stroke Mother    Family Status  Relation Status Death Age  . Brother Deceased     complications re: DM, 8242  . Mother Deceased 71    cerebral hemorrhage  . Father Deceased 13    multiple causes    Immunization History  Administered Date(s) Administered  . Influenza Whole 08/01/2013  . Influenza-Unspecified 08/01/2014  . PPD Test 03/09/2012  . Pneumococcal Polysaccharide-23 10/18/1998  . Td 11/04/2011    Allergies  Allergen Reactions  . Codeine     UNKNOWN  . Tramadol     UNKNOWN  . Vesicare [Solifenacin Succinate]     UNKNOWN    Medications: Patient's Medications  New Prescriptions   No medications on file  Previous Medications   ACETAMINOPHEN (TYLENOL) 500 MG TABLET    Take 1,000 mg by mouth every 4 (four) hours as needed for moderate pain, fever or headache (severe pain).    ATENOLOL (TENORMIN) 25 MG TABLET    Take 12.5 mg by mouth every morning.    CALCIUM-VITAMIN D (OSCAL WITH D) 500-200 MG-UNIT PER TABLET    Take 1 tablet by mouth every morning.    CLOPIDOGREL (PLAVIX) 75 MG TABLET    Take 1 tablet (75 mg total) by mouth daily.   LOPERAMIDE (IMODIUM A-D) 2 MG TABLET    Take 2 mg by mouth. 1/2 to 1 tablet daily as needed for diarrhea   MELATONIN 3 MG CAPS    Take 6 mg by mouth at bedtime. To help with sleep   METFORMIN (GLUCOPHAGE) 500 MG TABLET    Take 500  mg by mouth 2 (two) times daily with a meal.   PANTOPRAZOLE (PROTONIX) 40 MG TABLET    Take 1 tablet (40 mg total) by mouth 2 (two) times daily before a meal.   POLYETHYLENE GLYCOL (MIRALAX / GLYCOLAX) PACKET    Take 17 g by mouth as needed.   SODIUM FLUORIDE (PREVIDENT 5000 BOOSTER) 1.1 % PSTE    Place 1 application onto teeth at bedtime.   SPIRONOLACTONE (ALDACTONE) 50 MG TABLET    Take 50 mg by mouth 2 (two) times daily.   TOLTERODINE (DETROL LA) 4 MG 24 HR CAPSULE    Take 4 mg by mouth daily.  Modified Medications   No medications on file  Discontinued Medications   ANTISEPTIC ORAL RINSE (BIOTENE) LIQD    15  mLs by Mouth Rinse route. Rinse one time per day   CLOBETASOL CREAM (TEMOVATE) 0.05 %    Apply 1 application topically 2 (two) times daily. To red and scaly areas   TRIAMCINOLONE CREAM (KENALOG) 0.1 %    Apply 1 application topically. Apply thin layer twice daily as needed for groin rash for no longer than 2 weeks at a time. Apply as needed for redness and itching    Review of Systems  Constitutional: Negative for fever, chills, activity change, appetite change and fatigue.  HENT: Negative.   Eyes: Negative.   Respiratory: Negative.   Cardiovascular: Positive for leg swelling.  Gastrointestinal: Positive for diarrhea.       Hx GI bleed. HX IBS.  Endocrine: Negative for cold intolerance, heat intolerance, polydipsia, polyphagia and polyuria.       Hx DM  Genitourinary:       Incontinence  Musculoskeletal: Positive for back pain, arthralgias and gait problem. Negative for myalgias and joint swelling.       Uses walker  Skin: Negative.   Allergic/Immunologic: Negative.   Neurological: Negative for tremors, seizures, syncope, facial asymmetry, speech difficulty, weakness, light-headedness and numbness.  Hematological: Negative.   Psychiatric/Behavioral: Positive for dysphoric mood. Negative for hallucinations, behavioral problems, confusion, self-injury, decreased concentration  and agitation. The patient is not nervous/anxious and is not hyperactive.     Filed Vitals:   09/02/14 1451  BP: 110/64  Pulse: 64  Temp: 97.7 F (36.5 C)  TempSrc: Oral  Height: 5\' 8"  (1.727 m)  Weight: 153 lb (69.4 kg)   Body mass index is 23.27 kg/(m^2).  Physical Exam  Constitutional: She is oriented to person, place, and time. No distress.  overweight  HENT:  Head: Normocephalic and atraumatic.  Mouth/Throat: No oropharyngeal exudate.  Eyes: Pupils are equal, round, and reactive to light.  Neck: Normal range of motion. Neck supple. No JVD present. No tracheal deviation present. No thyromegaly present.  Cardiovascular: Normal rate, regular rhythm, normal heart sounds and intact distal pulses.  Exam reveals no gallop and no friction rub.   No murmur heard. Pulmonary/Chest: Breath sounds normal. No respiratory distress. She has no wheezes. She has no rales.  Abdominal: She exhibits no distension and no mass. There is no tenderness.  Musculoskeletal: She exhibits edema. She exhibits no tenderness.  Lymphadenopathy:    She has no cervical adenopathy.  Neurological: She is oriented to person, place, and time. She has normal reflexes. No cranial nerve deficit. Coordination normal.  Skin: Skin is warm and dry. No rash noted. She is not diaphoretic. No pallor.  Psychiatric: Her behavior is normal. Judgment and thought content normal.  Depressed acting. Frustrated with her life.     Labs reviewed: Nursing Home on 09/02/2014  Component Date Value Ref Range Status  . Hemoglobin 08/29/2014 14.8  12.0 - 16.0 g/dL Final  . HCT 08/29/2014 45  36 - 46 % Final  . Platelets 08/29/2014 384  150 - 399 K/L Final  . WBC 08/29/2014 9.5   Final  . Glucose 08/29/2014 114   Final  . BUN 08/29/2014 10  4 - 21 mg/dL Final  . Creatinine 08/29/2014 0.8  0.5 - 1.1 mg/dL Final  . Potassium 08/29/2014 4.3  3.4 - 5.3 mmol/L Final  . Sodium 08/29/2014 135* 137 - 147 mmol/L Final  . Hgb A1c MFr Bld  08/29/2014 6.2* 4.0 - 6.0 % Final     Assessment/Plan 1. Essential hypertension, benign controlled  2. Edema Mild and  unchanged  3. Irritable bowel syndrome Will be seeing GI soon  4. Preventive measure - DNR (Do Not Resuscitate)

## 2014-09-24 ENCOUNTER — Encounter: Payer: Self-pay | Admitting: Geriatric Medicine

## 2015-02-26 ENCOUNTER — Non-Acute Institutional Stay: Payer: Medicare Other | Admitting: Nurse Practitioner

## 2015-02-26 ENCOUNTER — Encounter: Payer: Self-pay | Admitting: Nurse Practitioner

## 2015-02-26 VITALS — BP 124/70 | HR 68 | Temp 97.8°F | Wt 151.0 lb

## 2015-02-26 DIAGNOSIS — Z8679 Personal history of other diseases of the circulatory system: Secondary | ICD-10-CM

## 2015-02-26 DIAGNOSIS — L853 Xerosis cutis: Secondary | ICD-10-CM

## 2015-02-26 DIAGNOSIS — K589 Irritable bowel syndrome without diarrhea: Secondary | ICD-10-CM | POA: Diagnosis not present

## 2015-02-26 NOTE — Progress Notes (Signed)
Patient ID: Kara Harris, female   DOB: May 04, 1923, 81 y.o.   MRN: [id number]    Nursing Home Location:  [place] of Service: Clinic (12)  PCP: Estill Dooms, MD  Allergies  Allergen Reactions  . Codeine     UNKNOWN  . Tramadol     UNKNOWN  . Vesicare [Solifenacin Succinate]     UNKNOWN    Chief Complaint  Patient presents with  . Rash    bilateral lower legs  . Stroke Symptoms    question mini stroke this morning, left arm went numb, shaking for few seconds    HPI:  Patient is a 81 y.o. female seen today at [facility] clinic due to shaking of her left arm and rash.  Pt reports occasionally she will have episodes of her left arm going numb with a tremor for a very short time. Reports she has mini-strokes due to relatives having hx of CVA, mother died of a stroke.  Has not told anyone about this before. No other symptoms with this. No blurred vision, changes LOC, no slurred speech, no numbness or tingling to any other area.   Small dry patchy area to left LE, does not cause any discomfort, does not bother her but staff noted it was there.   Had diarrhea today, no N or V, no abdominal pain. No fevers   Review of Systems:  Review of Systems  Constitutional: Negative for fever, activity change, appetite change and unexpected weight change.  Respiratory: Negative for cough and shortness of breath.   Cardiovascular: Negative for chest pain, palpitations and leg swelling.  Gastrointestinal: Positive for diarrhea.  Genitourinary: Negative for difficulty urinating.  Skin: Negative for color change, pallor, rash and wound.  Neurological: Positive for tremors (only lasting a few seconds) and numbness (transient numbness in left arm lasting seconds). Negative for dizziness, seizures, syncope, facial asymmetry, speech difficulty, weakness, light-headedness and headaches.    Past Medical History  Diagnosis Date  . Hyperlipidemia    . Neuropathy   . Paroxysmal atrial tachycardia   . Diabetes mellitus   . Venous insufficiency   . Anxiety   . Depressive disorder   . Plantar fascial fibromatosis   . Insomnia   . Dysphagia   . TIA (transient ischemic attack)   . Rickets, active   . Hypertension   . Basilar artery syndrome   . Other atopic dermatitis and related conditions 12/13/2012  . Diarrhea 12/13/2012  . Peripheral vascular disease, unspecified 08/14/2012  . Fecal smearing 04/05/2012  . Unspecified hereditary and idiopathic peripheral neuropathy 03/17/2011  . Macular degeneration (senile) of retina, unspecified 03/17/2011  . Rash and other nonspecific skin eruption 12/21/2010  . Closed fracture of intertrochanteric section of femur 03/04/2010  . Spinal stenosis, unspecified region other than cervical 11/18/2008  . Pain in limb 11/18/2008  . Abnormality of gait 11/18/2008  . Pain in joint, pelvic region and thigh 10/2008  . Pain in joint, ankle and foot 08/07/2008  . Unspecified urinary incontinence 05/13/2008  . Corns and callosities 02/05/2008  . Vitamin D deficiency 11/07/2007  . Edema 10/25/2007  . Transient ischemic attack (TIA), and cerebral infarction without residual deficits(V12.54) 10/28/2002  . Type II or unspecified type diabetes mellitus with peripheral circulatory disorders, uncontrolled(250.72) 02/12/2013   Past Surgical History  Procedure Laterality Date  . Hip fracture surgery Right 02/2010  . Cholecystectomy  1964  . Tonsillectomy and adenoidectomy      as a  child  . Breast mass excision    . Incision / drainage hand / finger  2001    cat bite  . Dilation and curettage of uterus  2003    post menopausal bleeding. Bicornate uterus/doble cervix, benign path  . Colonoscopy  12/18/2010    Dr Oletta Lamas  . Esophagogastroduodenoscopy endoscopy  12/18/2010    Dr. Oletta Lamas slight gastritis   Social History:   reports that she has never smoked. She has never used smokeless tobacco. She reports that she does not  drink alcohol or use illicit drugs.  Family History  Problem Relation Age of Onset  . Diabetes Brother   . Stroke Mother     Medications: Patient's Medications  New Prescriptions   No medications on file  Previous Medications   ACETAMINOPHEN (TYLENOL) 500 MG TABLET    Take 1,000 mg by mouth every 4 (four) hours as needed for moderate pain, fever or headache (severe pain).    ATENOLOL (TENORMIN) 25 MG TABLET    Take 12.5 mg by mouth every morning.    CALCIUM-VITAMIN D (OSCAL WITH D) 500-200 MG-UNIT PER TABLET    Take 1 tablet by mouth every morning.    CLOPIDOGREL (PLAVIX) 75 MG TABLET    Take 1 tablet (75 mg total) by mouth daily.   LOPERAMIDE (IMODIUM A-D) 2 MG TABLET    Take 2 mg by mouth. 1/2 to 1 tablet daily as needed for diarrhea   MELATONIN 3 MG CAPS    Take 6 mg by mouth at bedtime. To help with sleep   METFORMIN (GLUCOPHAGE) 500 MG TABLET    Take 500 mg by mouth 2 (two) times daily with a meal.   PANTOPRAZOLE (PROTONIX) 40 MG TABLET    Take 1 tablet (40 mg total) by mouth 2 (two) times daily before a meal.   POLYETHYLENE GLYCOL (MIRALAX / GLYCOLAX) PACKET    Take 17 g by mouth as needed.   SODIUM FLUORIDE (PREVIDENT 5000 BOOSTER) 1.1 % PSTE    Place 1 application onto teeth at bedtime.   SPIRONOLACTONE (ALDACTONE) 50 MG TABLET    Take 50 mg by mouth 2 (two) times daily.   TOLTERODINE (DETROL LA) 4 MG 24 HR CAPSULE    Take 4 mg by mouth daily.  Modified Medications   No medications on file  Discontinued Medications   No medications on file     Physical Exam: Filed Vitals:   02/26/15 1513  BP: 124/70  Pulse: 68  Temp: 97.8 F (36.6 C)  TempSrc: Oral  Weight: 151 lb (68.493 kg)  SpO2: 99%    Physical Exam  Constitutional: She is oriented to person, place, and time. She appears well-developed and well-nourished.  HENT:  Head: Normocephalic and atraumatic.  Mouth/Throat: Oropharynx is clear and moist. No oropharyngeal exudate.  Eyes: Conjunctivae and EOM are  normal. Pupils are equal, round, and reactive to light.  Neck: Normal range of motion. Neck supple.  Cardiovascular: Normal rate, regular rhythm and normal heart sounds.   Pulmonary/Chest: Effort normal and breath sounds normal. No respiratory distress.  Abdominal: Soft. Bowel sounds are normal. She exhibits no distension.  Neurological: She is alert and oriented to person, place, and time. She displays normal reflexes. No cranial nerve deficit. Coordination normal.  Skin: Skin is warm, dry and intact.  Small patch of rough skin, without discoloration, does not effect patient.    Labs reviewed: Basic Metabolic Panel:  Recent Labs  04/01/14 08/29/14  NA 136* 135*  K 4.3 4.3  BUN 11 10  CREATININE 0.8 0.8   Liver Function Tests: No results for input(s): AST, ALT, ALKPHOS, BILITOT, PROT, ALBUMIN in the last 8760 hours. No results for input(s): LIPASE, AMYLASE in the last 8760 hours. No results for input(s): AMMONIA in the last 8760 hours. CBC:  Recent Labs  04/01/14 08/29/14  WBC 10.6 9.5  HGB 14.7 14.8  HCT 43 45  PLT 362 384   TSH: No results for input(s): TSH in the last 8760 hours. A1C: Lab Results  Component Value Date   HGBA1C 6.2* 08/29/2014   Lipid Panel: No results for input(s): CHOL, HDL, LDLCALC, TRIG, CHOLHDL, LDLDIRECT in the last 8760 hours.   Assessment/Plan 1. History of cardiovascular disorder Reports episode of TIA lasting a few seconds and completely resolved.  -currently on Plavix, will follow up CBC, CMP at this time  2. Dry skin - no rash noted, may use Eucerin lotion daily to dry ares   3. Irritable bowel syndrome Occasional diarrhea, hx of IBS, to cont imodium

## 2015-02-27 LAB — LIPID PANEL
Cholesterol: 185 mg/dL (ref 0–200)
HDL: 43 mg/dL (ref 35–70)
LDl/HDL Ratio: 4.3
Triglycerides: 183 mg/dL — AB (ref 40–160)

## 2015-02-27 LAB — BASIC METABOLIC PANEL
BUN: 11 mg/dL (ref 4–21)
Creatinine: 0.8 mg/dL (ref 0.5–1.1)
Glucose: 105 mg/dL
Potassium: 4.2 mmol/L (ref 3.4–5.3)
Sodium: 140 mmol/L (ref 137–147)

## 2015-02-27 LAB — HEPATIC FUNCTION PANEL
ALT: 14 U/L (ref 7–35)
AST: 15 U/L (ref 13–35)
Alkaline Phosphatase: 68 U/L (ref 25–125)
BILIRUBIN, TOTAL: 0.7 mg/dL

## 2015-02-27 LAB — CBC AND DIFFERENTIAL
HCT: 42 % (ref 36–46)
Hemoglobin: 14 g/dL (ref 12.0–16.0)
Platelets: 376 10*3/uL (ref 150–399)
WBC: 10.5 10*3/mL

## 2015-02-27 LAB — HEMOGLOBIN A1C: Hgb A1c MFr Bld: 6.1 % — AB (ref 4.0–6.0)

## 2015-03-03 ENCOUNTER — Encounter: Payer: Self-pay | Admitting: Internal Medicine

## 2015-03-04 ENCOUNTER — Other Ambulatory Visit: Payer: Self-pay | Admitting: Internal Medicine

## 2015-03-04 ENCOUNTER — Non-Acute Institutional Stay: Payer: Medicare Other | Admitting: Internal Medicine

## 2015-03-04 ENCOUNTER — Encounter: Payer: Self-pay | Admitting: Internal Medicine

## 2015-03-04 VITALS — BP 112/62 | HR 64 | Wt 153.0 lb

## 2015-03-04 DIAGNOSIS — N3941 Urge incontinence: Secondary | ICD-10-CM | POA: Diagnosis not present

## 2015-03-04 DIAGNOSIS — Z780 Asymptomatic menopausal state: Secondary | ICD-10-CM | POA: Diagnosis not present

## 2015-03-04 DIAGNOSIS — R739 Hyperglycemia, unspecified: Secondary | ICD-10-CM

## 2015-03-04 DIAGNOSIS — Z23 Encounter for immunization: Secondary | ICD-10-CM

## 2015-03-04 DIAGNOSIS — I10 Essential (primary) hypertension: Secondary | ICD-10-CM | POA: Diagnosis not present

## 2015-03-04 DIAGNOSIS — I872 Venous insufficiency (chronic) (peripheral): Secondary | ICD-10-CM | POA: Diagnosis not present

## 2015-03-04 DIAGNOSIS — E2839 Other primary ovarian failure: Secondary | ICD-10-CM

## 2015-03-04 DIAGNOSIS — K589 Irritable bowel syndrome without diarrhea: Secondary | ICD-10-CM | POA: Diagnosis not present

## 2015-03-04 DIAGNOSIS — L853 Xerosis cutis: Secondary | ICD-10-CM

## 2015-03-04 NOTE — Progress Notes (Signed)
Patient ID: Kara Harris, female   DOB: June 11, 1923, 81 y.o.   MRN: [id number]   Location:  [facility]  Code Status: DNR  Goals of Care:Advanced Directive information Does patient have an advance directive?: Yes, Type of Advance Directive: Out of facility DNR (pink MOST or yellow form);Living will, Pre-existing out of facility DNR order (yellow form or pink MOST form): Yellow form placed in chart (order not valid for inpatient use), Does patient want to make changes to advanced directive?: No - Patient declined  Chief Complaint  Patient presents with  . Medical Management of Chronic Issues    blood sugar, blood pressure, depression, memory    HPI: Patient is a 81 y.o. white female seen in the [facility] clinic today for med mgt of chronic diseases (six mo f/u).  inovalon insurance form also completed.  Discussed diet and exercise with her.  She is taking her metformin for diabetes.  Her renal function is normal.  She is given her medications by nursing and self administers them.    Says that she didn't really have a rash last week when she was brought to clinic.  Has dry skin and using eucerin.    Says her mother died early in her 39s with a cerebral hemorrhage.  Pt was felt to have had a TIA last week and that was the other reason she was seen last week.  Could also have been a seizure.    Says her bladder is no different.  Still has to wear a pad everyday.    Walks with walker all of the time b/c she does not want to fall another time.  Loves to travel and misses that.    Misses her cat she had which passed away. Otherwise mood is good.    She's never had a bone density that I can find, but has had a prior hip fx.  Only on ca with D.    Review of Systems:  Review of Systems  Constitutional: Negative for fever and chills.  HENT: Negative for congestion.   Eyes: Negative for blurred vision.  Respiratory: Negative for shortness of breath.   Cardiovascular: Positive for leg  swelling. Negative for chest pain.       Venous insufficiency  Gastrointestinal: Positive for diarrhea and constipation. Negative for abdominal pain, blood in stool and melena.  Genitourinary: Positive for urgency and frequency. Negative for dysuria and hematuria.       Leakage  Musculoskeletal: Negative for falls.  Skin: Negative for rash.       Dry itchy skin, psoriasis  Neurological: Negative for dizziness.       Unsteady gait so uses walker all of the time  Psychiatric/Behavioral: Negative for memory loss.    Past Medical History  Diagnosis Date  . Hyperlipidemia   . Neuropathy   . Paroxysmal atrial tachycardia   . Diabetes mellitus   . Venous insufficiency   . Anxiety   . Depressive disorder   . Plantar fascial fibromatosis   . Insomnia   . Dysphagia   . TIA (transient ischemic attack)   . Rickets, active   . Hypertension   . Basilar artery syndrome   . Other atopic dermatitis and related conditions 12/13/2012  . Diarrhea 12/13/2012  . Peripheral vascular disease, unspecified 08/14/2012  . Fecal smearing 04/05/2012  . Unspecified hereditary and idiopathic peripheral neuropathy 03/17/2011  . Macular degeneration (senile) of retina, unspecified 03/17/2011  . Rash and other nonspecific skin eruption 12/21/2010  .  Closed fracture of intertrochanteric section of femur 03/04/2010  . Spinal stenosis, unspecified region other than cervical 11/18/2008  . Pain in limb 11/18/2008  . Abnormality of gait 11/18/2008  . Pain in joint, pelvic region and thigh 10/2008  . Pain in joint, ankle and foot 08/07/2008  . Unspecified urinary incontinence 05/13/2008  . Corns and callosities 02/05/2008  . Vitamin D deficiency 11/07/2007  . Edema 10/25/2007  . Transient ischemic attack (TIA), and cerebral infarction without residual deficits(V12.54) 10/28/2002  . Type II or unspecified type diabetes mellitus with peripheral circulatory disorders, uncontrolled(250.72) 02/12/2013    Past Surgical History    Procedure Laterality Date  . Hip fracture surgery Right 02/2010  . Cholecystectomy  1964  . Tonsillectomy and adenoidectomy      as a child  . Breast mass excision    . Incision / drainage hand / finger  2001    cat bite  . Dilation and curettage of uterus  2003    post menopausal bleeding. Bicornate uterus/doble cervix, benign path  . Colonoscopy  12/18/2010    Dr Oletta Lamas  . Esophagogastroduodenoscopy endoscopy  12/18/2010    Dr. Oletta Lamas slight gastritis    Social History:   reports that she has never smoked. She has never used smokeless tobacco. She reports that she does not drink alcohol or use illicit drugs.  Allergies  Allergen Reactions  . Codeine     UNKNOWN  . Tramadol     UNKNOWN  . Vesicare [Solifenacin Succinate]     UNKNOWN    Medications: Patient's Medications  New Prescriptions   No medications on file  Previous Medications   ACETAMINOPHEN (TYLENOL) 500 MG TABLET    Take 1,000 mg by mouth every 4 (four) hours as needed for moderate pain, fever or headache (severe pain).    ATENOLOL (TENORMIN) 25 MG TABLET    Take 12.5 mg by mouth every morning.    CALCIUM-VITAMIN D (OSCAL WITH D) 500-200 MG-UNIT PER TABLET    Take 1 tablet by mouth every morning.    CLOPIDOGREL (PLAVIX) 75 MG TABLET    Take 1 tablet (75 mg total) by mouth daily.   COLESTIPOL (COLESTID) 1 G TABLET    Take 1 g by mouth. Take one as needed for diarrhea, not to be given within 2 hours of other med.   EMOLLIENT (EUCERIN) LOTION    Apply topically as needed for dry skin.   LOPERAMIDE (IMODIUM A-D) 2 MG TABLET    Take 2 mg by mouth. 1/2 to 1 tablet daily as needed for diarrhea   MELATONIN 3 MG CAPS    Take 6 mg by mouth at bedtime. To help with sleep   METFORMIN (GLUCOPHAGE) 500 MG TABLET    Take 500 mg by mouth 2 (two) times daily with a meal.   PANTOPRAZOLE (PROTONIX) 40 MG TABLET    Take 1 tablet (40 mg total) by mouth 2 (two) times daily before a meal.   POLYETHYLENE GLYCOL (MIRALAX / GLYCOLAX)  PACKET    Take 17 g by mouth as needed.   SODIUM FLUORIDE (PREVIDENT 5000 BOOSTER) 1.1 % PSTE    Place 1 application onto teeth at bedtime.   SPIRONOLACTONE (ALDACTONE) 50 MG TABLET    Take 50 mg by mouth 2 (two) times daily.   TOLTERODINE (DETROL LA) 4 MG 24 HR CAPSULE    Take 4 mg by mouth daily.  Modified Medications   No medications on file  Discontinued Medications   No  medications on file     Physical Exam: Filed Vitals:   03/04/15 1405  BP: 112/62  Pulse: 64  Weight: 153 lb (69.4 kg)  SpO2: 99%   Body mass index is 23.27 kg/(m^2). Physical Exam  Constitutional: She is oriented to person, place, and time. She appears well-developed and well-nourished. No distress.  Cardiovascular: Normal rate, regular rhythm, normal heart sounds and intact distal pulses.   Mild nonpitting edema, skin changes c/w venous insufficiency  Pulmonary/Chest: Effort normal and breath sounds normal.  Abdominal: Soft. Bowel sounds are normal.  Musculoskeletal: Normal range of motion.  Neurological: She is alert and oriented to person, place, and time.  Skin: Skin is warm and dry.  Dry scaly skin on anterior shins  Psychiatric: She has a normal mood and affect.     Labs reviewed: Basic Metabolic Panel:  Recent Labs  04/01/14 08/29/14 02/27/15  NA 136* 135* 140  K 4.3 4.3 4.2  BUN 11 10 11   CREATININE 0.8 0.8 0.8   Liver Function Tests:  Recent Labs  02/27/15  AST 15  ALT 14  ALKPHOS 68   No results for input(s): LIPASE, AMYLASE in the last 8760 hours. No results for input(s): AMMONIA in the last 8760 hours. CBC:  Recent Labs  04/01/14 08/29/14 02/27/15  WBC 10.6 9.5 10.5  HGB 14.7 14.8 14.0  HCT 43 45 42  PLT 362 384 376   Lipid Panel:  Recent Labs  02/27/15  CHOL 185  HDL 43  TRIG 183*   Lab Results  Component Value Date   HGBA1C 6.1* 02/27/2015   Patient Care Team: Gayland Curry, DO as PCP - General (Geriatric Medicine) [facility] Laurence Spates, MD as Consulting Physician (Gastroenterology) Lorretta Harp, MD as Consulting Physician (Cardiology) Garvin Fila, MD as Consulting Physician (Neurology) Bjorn Loser, MD as Consulting Physician (Urology) Pedro Earls, MD as Attending Physician (Family Medicine)  Assessment/Plan 1. Urge incontinence of urine -has had to wear pads daily and does not know if detrol is reallly helping her -due to potential side effects like worsening cognition, falls, dry mouth and constipation I chose to d/c detrol today  2. Essential hypertension, benign -bp at goal, no changes  3. Dry skin -is getting eucerin cream with benefit -also noted to have psoriasis in some areas  4. Venous insufficiency -stable, on spironolactone for edema and bp -possibly if used compression hose and elevated feet, might be able to do w/o diuretic which could help incontinence also  5. Irritable bowel syndrome -says this is worse latelly--interfering with her winning bingo money -she has had to leave the sessions earlly to use the bathroom -continues on colestipol and imodium prn, but still having symptoms -order written to ensure that she is getting her metformin with food b/c it could be the culprit otherwise for diarrhea  6. Hyperglycemia -is on metformin 500mg  po bid--must be with meals -hba1c at goal--I wonder if she even needs to be on metformin  7. Postmenopausal estrogen deficiency -has not had bone density--order written today--could be done at breast center so it will appear in our system -cont ca with D -had prior hip fx so probably should be on prolia or at least a bisphosphonate  8. Need for vaccination with 13-polyvalent pneumococcal conjugate vaccine -prevnar was given today  Labs/tests ordered: hba1c, bmp before  Next appt:  Annual exam in 6 mos  Elie Gragert L. Thanos Cousineau, D.O. [facility]  [address], [place] [address] Cell Phone  (Mon-Fri 8am-5pm):  [phone] On Call:  [phone] & follow prompts after 5pm & weekends Office Phone:  [phone] Office Fax:  [phone]

## 2015-03-07 ENCOUNTER — Inpatient Hospital Stay
Admission: RE | Admit: 2015-03-07 | Discharge: 2015-03-07 | Disposition: A | Discharge status: Home / Self Care | Payer: Self-pay | Source: Ambulatory Visit | Attending: Internal Medicine | Admitting: Internal Medicine

## 2015-03-07 ENCOUNTER — Other Ambulatory Visit: Payer: Self-pay

## 2015-03-14 ENCOUNTER — Ambulatory Visit
Admission: RE | Admit: 2015-03-14 | Discharge: 2015-03-14 | Disposition: A | Discharge status: Home / Self Care | Payer: Medicare Other | Source: Ambulatory Visit | Attending: Internal Medicine | Admitting: Internal Medicine

## 2015-03-14 DIAGNOSIS — E2839 Other primary ovarian failure: Secondary | ICD-10-CM

## 2015-03-24 ENCOUNTER — Encounter: Payer: Self-pay | Admitting: *Deleted

## 2015-04-10 ENCOUNTER — Telehealth: Payer: Self-pay

## 2015-04-10 NOTE — Telephone Encounter (Signed)
Completed and faxed insurance request form

## 2015-04-30 ENCOUNTER — Ambulatory Visit: Payer: Self-pay

## 2015-05-07 ENCOUNTER — Ambulatory Visit (INDEPENDENT_AMBULATORY_CARE_PROVIDER_SITE_OTHER): Payer: Medicare Other

## 2015-05-07 DIAGNOSIS — M810 Age-related osteoporosis without current pathological fracture: Secondary | ICD-10-CM

## 2015-05-07 MED ORDER — DENOSUMAB 60 MG/ML ~~LOC~~ SOLN
60.0000 mg | Freq: Once | SUBCUTANEOUS | Status: AC
  Administered 2015-05-07: 60 mg via SUBCUTANEOUS

## 2015-05-12 ENCOUNTER — Encounter: Payer: Self-pay | Admitting: Cardiovascular Disease

## 2015-08-27 ENCOUNTER — Encounter: Payer: Self-pay | Admitting: Internal Medicine

## 2015-08-27 ENCOUNTER — Non-Acute Institutional Stay: Payer: Medicare Other | Admitting: Internal Medicine

## 2015-08-27 VITALS — BP 124/62 | HR 67 | Temp 97.5°F

## 2015-08-27 DIAGNOSIS — K582 Mixed irritable bowel syndrome: Secondary | ICD-10-CM

## 2015-08-27 DIAGNOSIS — I739 Peripheral vascular disease, unspecified: Secondary | ICD-10-CM | POA: Diagnosis not present

## 2015-08-27 DIAGNOSIS — F0391 Unspecified dementia with behavioral disturbance: Secondary | ICD-10-CM | POA: Diagnosis not present

## 2015-08-27 DIAGNOSIS — E1143 Type 2 diabetes mellitus with diabetic autonomic (poly)neuropathy: Secondary | ICD-10-CM

## 2015-08-27 DIAGNOSIS — S91209D Unspecified open wound of unspecified toe(s) with damage to nail, subsequent encounter: Secondary | ICD-10-CM

## 2015-08-27 DIAGNOSIS — F03918 Unspecified dementia, unspecified severity, with other behavioral disturbance: Secondary | ICD-10-CM

## 2015-08-27 LAB — BASIC METABOLIC PANEL
BUN: 10 mg/dL (ref 4–21)
Creatinine: 0.8 mg/dL (ref 0.5–1.1)
Glucose: 117 mg/dL
Potassium: 4.3 mmol/L (ref 3.4–5.3)
Sodium: 137 mmol/L (ref 137–147)

## 2015-08-27 LAB — LIPID PANEL
CHOLESTEROL: 173 mg/dL (ref 0–200)
HDL: 38 mg/dL (ref 35–70)
LDL CALC: 102 mg/dL
TRIGLYCERIDES: 163 mg/dL — AB (ref 40–160)

## 2015-08-27 LAB — CBC AND DIFFERENTIAL
HEMATOCRIT: 42 % (ref 36–46)
Hemoglobin: 14.2 g/dL (ref 12.0–16.0)
PLATELETS: 354 10*3/uL (ref 150–399)
WBC: 11.1 10*3/mL

## 2015-08-27 LAB — TSH: TSH: 1.69 u[IU]/mL (ref 0.41–5.90)

## 2015-08-27 LAB — HEPATIC FUNCTION PANEL
ALK PHOS: 52 U/L (ref 25–125)
ALT: 12 U/L (ref 7–35)
AST: 14 U/L (ref 13–35)
Bilirubin, Total: 0.3 mg/dL

## 2015-08-27 LAB — HEMOGLOBIN A1C: Hgb A1c MFr Bld: 6.3 % — AB (ref 4.0–6.0)

## 2015-08-27 NOTE — Progress Notes (Signed)
Patient ID: Kara Harris, female   DOB: 12-28-1922, 81 y.o.   MRN: [id number]   Location: [facility] Provider: Shiven Junious L. Mariea Clonts, D.O., C.M.D.  Code Status: DNR Goals of Care: Advanced Directive information Does patient have an advance directive?: Yes, Type of Advance Directive: Out of facility DNR (pink MOST or yellow form), Pre-existing out of facility DNR order (yellow form or pink MOST form): Yellow form placed in chart (order not valid for inpatient use)  Chief Complaint  Patient presents with  . Medical Management of Chronic Issues    dementia getting worse  . nail    left great toe nail was loose, was removed 08/25/15 by Dr. Barkley Bruns    HPI: Patient is a 81 y.o. female seen in the office today for med mgt of chronic diseases.  Her dementia is getting worse as per AL nurse manager and notes reviewed in matrix indicating short term memory loss and periods of agitation with staff about keeping her own medication.  She is not on memory meds.  She also had an avulsion of her left great toneail and it was since removed altogether by podiatry.  She has not been properly manage it on her own and staff were asked to assist.  Gabapentin seems to have helped her neuropathic pain in her legs.  I also suspect some vascular component to this.  She c/o a piece of some food behind her front upper teeth that she can't get out.  Discussed use of floss and mouthwash.  Review of Systems:  Review of Systems  Constitutional: Negative for fever and chills.  HENT: Negative for congestion.   Eyes:       Glasses  Respiratory: Negative for shortness of breath.   Cardiovascular: Positive for leg swelling. Negative for chest pain.  Gastrointestinal: Positive for diarrhea and constipation. Negative for abdominal pain.  Genitourinary: Negative for dysuria.  Musculoskeletal: Negative for falls.  Neurological: Negative for dizziness and loss of consciousness.  Psychiatric/Behavioral: Positive for  depression and memory loss.    Past Medical History  Diagnosis Date  . Hyperlipidemia   . Neuropathy ([place])   . Paroxysmal atrial tachycardia ([place])   . Diabetes mellitus   . Venous insufficiency   . Anxiety   . Depressive disorder   . Plantar fascial fibromatosis   . Insomnia   . Dysphagia   . TIA (transient ischemic attack)   . Rickets, active   . Hypertension   . Basilar artery syndrome   . Other atopic dermatitis and related conditions 12/13/2012  . Diarrhea 12/13/2012  . Peripheral vascular disease, unspecified ([place]) 08/14/2012  . Fecal smearing 04/05/2012  . Unspecified hereditary and idiopathic peripheral neuropathy 03/17/2011  . Macular degeneration (senile) of retina, unspecified 03/17/2011  . Rash and other nonspecific skin eruption 12/21/2010  . Closed fracture of intertrochanteric section of femur ([place]) 03/04/2010  . Spinal stenosis, unspecified region other than cervical 11/18/2008  . Pain in limb 11/18/2008  . Abnormality of gait 11/18/2008  . Pain in joint, pelvic region and thigh 10/2008  . Pain in joint, ankle and foot 08/07/2008  . Unspecified urinary incontinence 05/13/2008  . Corns and callosities 02/05/2008  . Vitamin D deficiency 11/07/2007  . Edema 10/25/2007  . Transient ischemic attack (TIA), and cerebral infarction without residual deficits 10/28/2002  . Type II or unspecified type diabetes mellitus with peripheral circulatory disorders, uncontrolled(250.72) 02/12/2013  . Osteoporosis, senile     Past Surgical History  Procedure Laterality Date  . Hip  fracture surgery Right 02/2010  . Cholecystectomy  1964  . Tonsillectomy and adenoidectomy      as a child  . Breast mass excision    . Incision / drainage hand / finger  2001    cat bite  . Dilation and curettage of uterus  2003    post menopausal bleeding. Bicornate uterus/doble cervix, benign path  . Colonoscopy  12/18/2010    Dr Oletta Lamas  . Esophagogastroduodenoscopy endoscopy  12/18/2010    Dr. Oletta Lamas slight  gastritis    Allergies  Allergen Reactions  . Codeine     UNKNOWN  . Tramadol     UNKNOWN  . Vesicare [Solifenacin Succinate]     UNKNOWN      Medication List       This list is accurate as of: 08/27/15 12:04 PM.  Always use your most recent med list.               acetaminophen 500 MG tablet  Commonly known as:  TYLENOL  Take 1,000 mg by mouth every 4 (four) hours as needed for moderate pain, fever or headache (severe pain).     atenolol 25 MG tablet  Commonly known as:  TENORMIN  Take 12.5 mg by mouth every morning.     calcium-vitamin D 500-200 MG-UNIT tablet  Commonly known as:  OSCAL WITH D  Take 1 tablet by mouth every morning.     clopidogrel 75 MG tablet  Commonly known as:  PLAVIX  Take 1 tablet (75 mg total) by mouth daily.     diphenhydrAMINE 12.5 MG/5ML ELIX 20 mL, aluminum & magnesium hydroxide-simethicone 400-400-40 MG/5ML SUSP 20 mL, lidocaine 2 % SOLN 20 mL  Swish and spit 15 mLs. Rinse then spit out twice daily as needed     eucerin lotion  Apply topically as needed for dry skin.     gabapentin 100 MG capsule  Commonly known as:  NEURONTIN  Take 100 mg by mouth. Take one tablet three times daily     loperamide 2 MG tablet  Commonly known as:  IMODIUM A-D  Take 2 mg by mouth. l tablet after each loose stools as needed for diarrhea. No more than 4 tablets a day     Melatonin 3 MG Caps  Take 6 mg by mouth at bedtime. To help with sleep     metFORMIN 500 MG tablet  Commonly known as:  GLUCOPHAGE  Take 500 mg by mouth 2 (two) times daily with a meal.     pantoprazole 40 MG tablet  Commonly known as:  PROTONIX  Take 1 tablet (40 mg total) by mouth 2 (two) times daily before a meal.     polyethylene glycol packet  Commonly known as:  MIRALAX / GLYCOLAX  Take 17 g by mouth as needed.     PREVIDENT 5000 BOOSTER 1.1 % Pste  Generic drug:  Sodium Fluoride  Place 1 application onto teeth at bedtime.     spironolactone 50 MG tablet  Commonly  known as:  ALDACTONE  Take 50 mg by mouth 2 (two) times daily.        Health Maintenance  Topic Date Due  . OPHTHALMOLOGY EXAM  06/21/1933  . URINE MICROALBUMIN  06/21/1935  . ZOSTAVAX  06/20/1985  . INFLUENZA VACCINE  05/19/2015  . HEMOGLOBIN A1C  08/30/2015  . FOOT EXAM  09/03/2015  . TETANUS/TDAP  11/03/2021  . DEXA SCAN  Completed  . PNA vac Low Risk Adult  Completed  Physical Exam: Filed Vitals:   08/27/15 1141  BP: 124/62  Pulse: 67  Temp: 97.5 F (36.4 C)  TempSrc: Oral  SpO2: 99%   There is no weight on file to calculate BMI. Physical Exam  Constitutional: She appears well-developed and well-nourished.  Cardiovascular: Normal rate, regular rhythm, normal heart sounds and intact distal pulses.   Pulmonary/Chest: Effort normal and breath sounds normal. No respiratory distress.  Abdominal: Soft. Bowel sounds are normal.  Musculoskeletal: Normal range of motion.  Came down to appt in wheelchair  Neurological: She is alert.  Skin: Skin is warm and dry.  Left great toenail was removed, blood dried and dressing stuck to it; bilateral LE nonpitting edema with purple splotchy discoloration of legs  Psychiatric: She has a normal mood and affect.    Labs reviewed: Basic Metabolic Panel:  Recent Labs  08/29/14 02/27/15  NA 135* 140  K 4.3 4.2  BUN 10 11  CREATININE 0.8 0.8   Liver Function Tests:  Recent Labs  02/27/15  AST 15  ALT 14  ALKPHOS 68   No results for input(s): LIPASE, AMYLASE in the last 8760 hours. No results for input(s): AMMONIA in the last 8760 hours. CBC:  Recent Labs  08/29/14 02/27/15  WBC 9.5 10.5  HGB 14.8 14.0  HCT 45 42  PLT 384 376   Lipid Panel:  Recent Labs  02/27/15  CHOL 185  HDL 43  TRIG 183*   Lab Results  Component Value Date   HGBA1C 6.1* 02/27/2015    Procedures since last visit: bone density reviewed  Assessment/Plan 1. Avulsion of toenail, subsequent encounter -was falling off and her podiatrist  removed the left great toenail and she is to be soaking it twice daily, applying a dry gauze and wrapping it with coban -she is not able to successfully take care of this independently--dressing stuck to toe; AL nursing to check with podiatry office b/c instructions were not to use any kind of ointments or creams on the open area; 30 min soak this am did not dislodge the dry gauza and pt is afraid of pain with removal  2. Peripheral vascular disease, unspecified ([place]) -suspect some PAD based on appearance of legs and nail falling off -monitor for P signs -not a good candidate for ABIs and arterial dopplers or endovascular interventions due to dementia and age of 77  3. Dementia with behavioral disturbance -has bene irritable and agitated recently -will start her on low dose namenda XR 7mg  po daily for a week, then increase to 14mg  and monitor  -hold off on full titration until we're sure she'll tolerate it  4. Irritable bowel syndrome with both constipation and diarrhea -has had this diagnosis, does not comply with proper treatment on her own--off colestipol -wants to see another GI specialist b/c she does not remember what she is told  5. Type II diabetes mellitus with peripheral autonomic neuropathy (HCC) -last noted as hyperglycemia -overdue for hba1c evaluation -cont to monitor  Labs/tests ordered:  Hba1c, cbc, bmp, liver panel, FLP, B12/folate, TSH  Next appt: 3 mos.  Jeralynn Vaquera L. Shourya Macpherson, D.O. [facility] [address], [place] [address] Cell Phone (Mon-Fri 8am-5pm):  [phone] On Call:  [phone] & follow prompts after 5pm & weekends Office Phone:  [phone] Office Fax:  [phone]

## 2015-09-09 LAB — HEMOGLOBIN A1C: HEMOGLOBIN A1C: 6.1 % — AB (ref 4.0–6.0)

## 2015-09-09 LAB — BASIC METABOLIC PANEL
BUN: 13 mg/dL (ref 4–21)
CREATININE: 0.8 mg/dL (ref 0.5–1.1)
Glucose: 98 mg/dL
POTASSIUM: 5 mmol/L (ref 3.4–5.3)
SODIUM: 138 mmol/L (ref 137–147)

## 2015-09-10 ENCOUNTER — Non-Acute Institutional Stay: Payer: Medicare Other | Admitting: Internal Medicine

## 2015-09-10 ENCOUNTER — Encounter: Payer: Self-pay | Admitting: Internal Medicine

## 2015-09-10 VITALS — BP 120/64 | HR 80 | Temp 97.8°F

## 2015-09-10 DIAGNOSIS — Z0000 Encounter for general adult medical examination without abnormal findings: Secondary | ICD-10-CM

## 2015-09-10 DIAGNOSIS — F329 Major depressive disorder, single episode, unspecified: Secondary | ICD-10-CM

## 2015-09-10 DIAGNOSIS — F0391 Unspecified dementia with behavioral disturbance: Secondary | ICD-10-CM

## 2015-09-10 DIAGNOSIS — S91209D Unspecified open wound of unspecified toe(s) with damage to nail, subsequent encounter: Secondary | ICD-10-CM | POA: Diagnosis not present

## 2015-09-10 DIAGNOSIS — F32A Depression, unspecified: Secondary | ICD-10-CM

## 2015-09-10 DIAGNOSIS — I739 Peripheral vascular disease, unspecified: Secondary | ICD-10-CM | POA: Diagnosis not present

## 2015-09-10 DIAGNOSIS — K582 Mixed irritable bowel syndrome: Secondary | ICD-10-CM | POA: Diagnosis not present

## 2015-09-10 DIAGNOSIS — M810 Age-related osteoporosis without current pathological fracture: Secondary | ICD-10-CM | POA: Diagnosis not present

## 2015-09-10 DIAGNOSIS — I10 Essential (primary) hypertension: Secondary | ICD-10-CM | POA: Diagnosis not present

## 2015-09-10 DIAGNOSIS — E1143 Type 2 diabetes mellitus with diabetic autonomic (poly)neuropathy: Secondary | ICD-10-CM | POA: Diagnosis not present

## 2015-09-10 DIAGNOSIS — F03918 Unspecified dementia, unspecified severity, with other behavioral disturbance: Secondary | ICD-10-CM

## 2015-09-10 NOTE — Progress Notes (Signed)
Visit occurred at [facility] not [facility]

## 2015-09-10 NOTE — Progress Notes (Signed)
Patient ID: Kara Harris, female   DOB: 01-17-1923, 81 y.o.   MRN: [id number] 09/10/15 MMSE 26/30 failed clock drawing. Given by Donita Brooks, CMA

## 2015-09-10 NOTE — Progress Notes (Signed)
Location:  [facility]  Code Status: DNR Goals of Care: Advanced Directive information Does patient have an advance directive?: Yes, Type of Advance Directive: Living will;Out of facility DNR (pink MOST or yellow form), Pre-existing out of facility DNR order (yellow form or pink MOST form): Yellow form placed in chart (order not valid for inpatient use)   Chief Complaint  Patient presents with  . Annual Exam    Comprehensive exam  . Medical Management of Chronic Issues    blood pressure, blood pressure, depression    HPI: Patient is a 81 y.o. female seen in the office today for a complete physical exam.  She was started on Namenda XR two weeks ago for dementia with some agitation. Dose was titrated up to 14mg  so far. She does not recall this medication. She has noted no change in the past 2 weeks. She denies that she has any problems with her memory and states that she aced the MMSE today. (she scored 26/30, failed clock test)  Irritable bowel- she has occasional diarrhea and eats mostly peanut butter and jelly sandwiches because these are easier on her stomach. She says she is thinking about seeing a GI doctor.   Type II DM with peripheral autonomicneuropathy- HgbA1C- 6.3- up from 6.1. Denies numbness or tingling in extremities, no vision changes, no hypoglycemic symptoms.   Functional status: She lives in [facility] living. Uses a walker around her home and the unit. Uses a wheelchair for longer distances. She requires assistance with bathing, dressing and managing medications. She is able to feed herself. Diet: She eats a regular diet. Exercise: No regular exercise.  Continence: She is continent of stool and urine. Falls:  Fall Risk  09/10/2015 09/02/2014 08/29/2013  Falls in the past year? No No No    Vision:   Visual Acuity Screening   Right eye Left eye Both eyes  Without correction:     With correction: 20/40 20/40 20/40      Hearing: No hearing deficit.  Depression:  Depression screen [facility] 2/9 09/10/2015 09/02/2014  Decreased Interest 0 1  Down, Depressed, Hopeless 3 3  PHQ - 2 Score 3 4  Altered sleeping 3 2  Tired, decreased energy 1 2  Change in appetite 0 0  Feeling bad or failure about yourself  0 0  Trouble concentrating 0 1  Moving slowly or fidgety/restless 0 0  Suicidal thoughts 0 0  PHQ-9 Score 7 -  Difficult doing work/chores Not difficult at all -   She reports feeling depressed. She is struggling to accept her loss of independence. She had hoped to live out her days traveling, however, she fell and those plans changed. She reminisced about a trip she took to [place] and [place] where a chimpanzee sat with her on her porch.   MMSE:   MMSE - Mini Mental State Exam 09/10/2015  Orientation to time 2  Orientation to Place 5  Registration 3  Attention/ Calculation 5  Recall 3  Language- name 2 objects 2  Language- repeat 1  Language- follow 3 step command 3  Language- read & follow direction 1  Write a sentence 1  Copy design 0  Total score 26     Review of Systems:  Review of Systems  Constitutional: Positive for malaise/fatigue. Negative for fever, chills, weight loss and diaphoresis.  HENT: Negative.   Eyes: Negative.   Respiratory: Negative.   Cardiovascular: Negative.   Gastrointestinal: Positive for diarrhea. Negative  for heartburn, nausea, vomiting, abdominal pain and blood in stool.  Genitourinary: Negative for dysuria, urgency, frequency, hematuria and flank pain.  Musculoskeletal: Negative for myalgias, back pain, joint pain, falls and neck pain.  Skin: Negative.   Neurological: Negative.  Negative for dizziness, tingling, tremors, sensory change and weakness.  Endo/Heme/Allergies: Negative.   Psychiatric/Behavioral: Positive for depression and memory loss. Negative for suicidal ideas. The patient is not nervous/anxious and does not have insomnia.     Past Medical  History  Diagnosis Date  . Hyperlipidemia   . Neuropathy ([place])   . Paroxysmal atrial tachycardia ([place])   . Diabetes mellitus   . Venous insufficiency   . Anxiety   . Depressive disorder   . Plantar fascial fibromatosis   . Insomnia   . Dysphagia   . TIA (transient ischemic attack)   . Rickets, active   . Hypertension   . Basilar artery syndrome   . Other atopic dermatitis and related conditions 12/13/2012  . Diarrhea 12/13/2012  . Peripheral vascular disease, unspecified ([place]) 08/14/2012  . Fecal smearing 04/05/2012  . Unspecified hereditary and idiopathic peripheral neuropathy 03/17/2011  . Macular degeneration (senile) of retina, unspecified 03/17/2011  . Rash and other nonspecific skin eruption 12/21/2010  . Closed fracture of intertrochanteric section of femur ([place]) 03/04/2010  . Spinal stenosis, unspecified region other than cervical 11/18/2008  . Pain in limb 11/18/2008  . Abnormality of gait 11/18/2008  . Pain in joint, pelvic region and thigh 10/2008  . Pain in joint, ankle and foot 08/07/2008  . Unspecified urinary incontinence 05/13/2008  . Corns and callosities 02/05/2008  . Vitamin D deficiency 11/07/2007  . Edema 10/25/2007  . Transient ischemic attack (TIA), and cerebral infarction without residual deficits 10/28/2002  . Type II or unspecified type diabetes mellitus with peripheral circulatory disorders, uncontrolled(250.72) 02/12/2013  . Osteoporosis, senile     Past Surgical History  Procedure Laterality Date  . Hip fracture surgery Right 02/2010  . Cholecystectomy  1964  . Tonsillectomy and adenoidectomy      as a child  . Breast mass excision    . Incision / drainage hand / finger  2001    cat bite  . Dilation and curettage of uterus  2003    post menopausal bleeding. Bicornate uterus/doble cervix, benign path  . Colonoscopy  12/18/2010    Dr Oletta Lamas  . Esophagogastroduodenoscopy endoscopy  12/18/2010    Dr. Oletta Lamas slight gastritis    Allergies  Allergen Reactions  .  Codeine     UNKNOWN  . Tramadol     UNKNOWN  . Vesicare [Solifenacin Succinate]     UNKNOWN   Medications: Patient's Medications  New Prescriptions   No medications on file  Previous Medications   ACETAMINOPHEN (TYLENOL) 500 MG TABLET    Take 1,000 mg by mouth every 4 (four) hours as needed for moderate pain, fever or headache (severe pain).    ATENOLOL (TENORMIN) 25 MG TABLET    Take 12.5 mg by mouth every morning.    CALCIUM-VITAMIN D (OSCAL WITH D) 500-200 MG-UNIT PER TABLET    Take 1 tablet by mouth every morning.    CLOPIDOGREL (PLAVIX) 75 MG TABLET    Take 1 tablet (75 mg total) by mouth daily.   DIPHENHYDRAMINE 12.5 MG/5ML ELIX 20 ML, ALUMINUM & MAGNESIUM HYDROXIDE-SIMETHICONE 400-400-40 MG/5ML SUSP 20 ML, LIDOCAINE 2 % SOLN 20 ML    Swish and spit 15 mLs. Rinse then spit out twice daily as needed  EMOLLIENT (EUCERIN) LOTION    Apply topically as needed for dry skin.   GABAPENTIN (NEURONTIN) 100 MG CAPSULE    Take 100 mg by mouth. Take one tablet three times daily   LOPERAMIDE (IMODIUM A-D) 2 MG TABLET    Take 2 mg by mouth. l tablet after each loose stools as needed for diarrhea. No more than 4 tablets a day   MELATONIN 3 MG CAPS    Take 6 mg by mouth at bedtime. To help with sleep   MEMANTINE (NAMENDA XR) 14 MG CP24 24 HR CAPSULE    Take 14 mg by mouth. Sprinkle once a day on food   METFORMIN (GLUCOPHAGE) 500 MG TABLET    Take 500 mg by mouth 2 (two) times daily with a meal.   PANTOPRAZOLE (PROTONIX) 40 MG TABLET    Take 1 tablet (40 mg total) by mouth 2 (two) times daily before a meal.   POLYETHYLENE GLYCOL (MIRALAX / GLYCOLAX) PACKET    Take 17 g by mouth as needed.   SODIUM FLUORIDE (PREVIDENT 5000 BOOSTER) 1.1 % PSTE    Place 1 application onto teeth at bedtime.   SPIRONOLACTONE (ALDACTONE) 50 MG TABLET    Take 50 mg by mouth 2 (two) times daily.   VITAMIN B-12 (CYANOCOBALAMIN) 1000 MCG TABLET    Take 1,000 mcg by mouth. Take one tablet daily  Modified Medications   No  medications on file  Discontinued Medications   No medications on file    Physical Exam: Filed Vitals:   09/10/15 1019  BP: 120/64  Pulse: 80  Temp: 97.8 F (36.6 C)  TempSrc: Oral  SpO2: 99%   Physical Exam  Constitutional: She appears well-developed and well-nourished. No distress.  HENT:  Head: Normocephalic and atraumatic.  Right Ear: External ear normal.  Left Ear: External ear normal.  Mouth/Throat: Oropharynx is clear and moist. No oropharyngeal exudate.  Eyes: EOM are normal. Pupils are equal, round, and reactive to light. Right eye exhibits no discharge. Left eye exhibits no discharge.  Wearing glasses  Neck: Normal range of motion. Neck supple. No tracheal deviation present. No thyromegaly present.  Cardiovascular: Normal rate, regular rhythm, normal heart sounds and intact distal pulses.  Exam reveals no gallop and no friction rub.   No murmur heard. Pulmonary/Chest: Effort normal and breath sounds normal. No respiratory distress. She has no wheezes. She has no rales. She exhibits no tenderness.  Abdominal: Soft. Bowel sounds are normal. She exhibits no distension and no mass. There is no tenderness. There is no rebound and no guarding. No hernia.  Musculoskeletal: Normal range of motion.  Uses walker and wheelchair- in wheelchair today  Lymphadenopathy:    She has no cervical adenopathy.  Neurological: She is alert. No cranial nerve deficit. Coordination normal.  Skin: Skin is warm and dry. She is not diaphoretic.  L first toenail removed, healing well, crusting noted, no exudate Bilateral LE purple, blanches, edema noted Dry flaking skin on arms  Psychiatric: She has a normal mood and affect. Her behavior is normal.  Poor recall    Labs reviewed: Basic Metabolic Panel:  Recent Labs  02/27/15  NA 140  K 4.2  BUN 11  CREATININE 0.8   Liver Function Tests:  Recent Labs  02/27/15  AST 15  ALT 14  ALKPHOS 68     CBC:  Recent Labs  02/27/15    WBC 10.5  HGB 14.0  HCT 42  PLT 376   Lipid Panel:  Recent Labs  02/27/15  CHOL 185  HDL 43  TRIG 183*   Lab Results  Component Value Date   HGBA1C 6.1* 02/27/2015    Assessment/Plan  1. Medicare annual wellness visit, subsequent Completed today. Noted her total protein and albumin were a touch low. Likely to poor oral intake. Encouraged increased dietary protein and Glucerna supplement.  2. Dementia with behavioral disturbance She denies any agitation or dementia problems. Namenda XR was started 2 weeks ago and titrated to 14mg  daily. Tolerating well. Will increase to 28mg  daily.  3. Avulsion of toenail, subsequent encounter Healing well. She did not have a dressing on it today and there doesn't appear to be a need for further dressing. She is able to wear a sock.   4. Type II diabetes mellitus with peripheral autonomic neuropathy (HCC) Controlled. Hgb a1c at 6.3- slightly elevated, however, given her age do not want to decrease blood sugar too low. However, metformin may be irritating her IBS and increasing loose stool frequency. Will stop metformin and start Tradjenta. There is also no need for weekly CBG's so will stop these as well.  5. Peripheral vascular disease, unspecified (HCC) Chronic. Appears arterial given her blanching, purple feet. Pedal pulses unable to be palpated. She is not a candidate for intervention due to her age and demential status.  6. Irritable bowel syndrome with both constipation and diarrhea Chronic. Mostly loose stools. Causing poor po intake. May be aggravated by metformin, so will stop in the hopes of decreasing loose stools.  7. Essential hypertension, benign Well controlled with current medications. Continue on atenolol 12.5mg  daily, monitor for hypotensive symptoms.  8. Osteoporosis, senile History of hip fracture. Continue to use walker and other safety interventions. Continue calcium and Vitamin D supplement.  9. Depression New  onset. Indicated by 7/9 PHQ-9 score and self reported symptoms. May also be contributing to agitation symptoms that have been associated with her dementia.  No suicidal ideations. Encouraged reminiscent therapy. Will start Zoloft in hopes of improving mood and decreasing agitation.  Next appt: Follow up in 3 months with Dr. Mariea Clonts for medical management of chronic issues.   Mariana Kaufman, RN, BSN Student- Nurse Practitioner- [facility] [address], [place] [address] Office Phone:  [phone] Office Fax:  [phone]

## 2015-10-27 ENCOUNTER — Other Ambulatory Visit: Payer: Self-pay | Admitting: Gastroenterology

## 2015-10-28 ENCOUNTER — Encounter (HOSPITAL_COMMUNITY): Payer: Self-pay | Admitting: *Deleted

## 2015-10-28 NOTE — Progress Notes (Signed)
Spoke with Ashley([facility])- acknowledged receipt of faxed instructions.

## 2015-11-05 NOTE — Progress Notes (Signed)
Spoke with [place] crews LPN [facility] [employer] phone [phone] to provide transportation and patient able to sign own consents

## 2015-11-07 ENCOUNTER — Encounter (HOSPITAL_COMMUNITY): Payer: Self-pay | Admitting: Anesthesiology

## 2015-11-07 ENCOUNTER — Ambulatory Visit (HOSPITAL_COMMUNITY)
Admission: RE | Admit: 2015-11-07 | Discharge: 2015-11-07 | Disposition: A | Discharge status: Skilled Nursing Facility | Payer: Medicare Other | Source: Ambulatory Visit | Attending: Gastroenterology | Admitting: Gastroenterology

## 2015-11-07 ENCOUNTER — Ambulatory Visit (HOSPITAL_COMMUNITY): Payer: Medicare Other | Admitting: Anesthesiology

## 2015-11-07 ENCOUNTER — Encounter (HOSPITAL_COMMUNITY)
Admission: RE | Disposition: A | Discharge status: Skilled Nursing Facility | Payer: Medicare Other | Source: Ambulatory Visit | Attending: Gastroenterology

## 2015-11-07 DIAGNOSIS — E785 Hyperlipidemia, unspecified: Secondary | ICD-10-CM | POA: Insufficient documentation

## 2015-11-07 DIAGNOSIS — R195 Other fecal abnormalities: Secondary | ICD-10-CM | POA: Diagnosis present

## 2015-11-07 DIAGNOSIS — Z79899 Other long term (current) drug therapy: Secondary | ICD-10-CM | POA: Diagnosis not present

## 2015-11-07 DIAGNOSIS — Z8673 Personal history of transient ischemic attack (TIA), and cerebral infarction without residual deficits: Secondary | ICD-10-CM | POA: Insufficient documentation

## 2015-11-07 DIAGNOSIS — I10 Essential (primary) hypertension: Secondary | ICD-10-CM | POA: Diagnosis not present

## 2015-11-07 DIAGNOSIS — Z96641 Presence of right artificial hip joint: Secondary | ICD-10-CM | POA: Insufficient documentation

## 2015-11-07 DIAGNOSIS — Z7984 Long term (current) use of oral hypoglycemic drugs: Secondary | ICD-10-CM | POA: Diagnosis not present

## 2015-11-07 DIAGNOSIS — K921 Melena: Secondary | ICD-10-CM | POA: Insufficient documentation

## 2015-11-07 DIAGNOSIS — E1151 Type 2 diabetes mellitus with diabetic peripheral angiopathy without gangrene: Secondary | ICD-10-CM | POA: Diagnosis not present

## 2015-11-07 DIAGNOSIS — E1140 Type 2 diabetes mellitus with diabetic neuropathy, unspecified: Secondary | ICD-10-CM | POA: Diagnosis not present

## 2015-11-07 DIAGNOSIS — G709 Myoneural disorder, unspecified: Secondary | ICD-10-CM | POA: Diagnosis not present

## 2015-11-07 DIAGNOSIS — K317 Polyp of stomach and duodenum: Secondary | ICD-10-CM | POA: Insufficient documentation

## 2015-11-07 DIAGNOSIS — H3530 Unspecified macular degeneration: Secondary | ICD-10-CM | POA: Diagnosis not present

## 2015-11-07 DIAGNOSIS — Z7902 Long term (current) use of antithrombotics/antiplatelets: Secondary | ICD-10-CM | POA: Diagnosis not present

## 2015-11-07 HISTORY — PX: ESOPHAGOGASTRODUODENOSCOPY (EGD) WITH PROPOFOL: SHX5813

## 2015-11-07 LAB — GLUCOSE, CAPILLARY: Glucose-Capillary: 145 mg/dL — ABNORMAL HIGH (ref 65–99)

## 2015-11-07 SURGERY — ESOPHAGOGASTRODUODENOSCOPY (EGD) WITH PROPOFOL
Anesthesia: Monitor Anesthesia Care

## 2015-11-07 MED ORDER — PROPOFOL 10 MG/ML IV BOLUS
INTRAVENOUS | Status: AC
  Filled 2015-11-07: qty 40

## 2015-11-07 MED ORDER — LACTATED RINGERS IV SOLN
INTRAVENOUS | Status: DC
  Administered 2015-11-07: 500 mL via INTRAVENOUS

## 2015-11-07 MED ORDER — PROPOFOL 500 MG/50ML IV EMUL
INTRAVENOUS | Status: DC | PRN
  Administered 2015-11-07: 30 mg via INTRAVENOUS

## 2015-11-07 MED ORDER — LIDOCAINE HCL (CARDIAC) 20 MG/ML IV SOLN
INTRAVENOUS | Status: AC
  Filled 2015-11-07: qty 5

## 2015-11-07 MED ORDER — PROPOFOL 500 MG/50ML IV EMUL
INTRAVENOUS | Status: DC | PRN
  Administered 2015-11-07: 100 ug/kg/min via INTRAVENOUS

## 2015-11-07 SURGICAL SUPPLY — 14 items

## 2015-11-07 NOTE — Op Note (Signed)
Kara Harris, [id number]   ENDOSCOPY PROCEDURE REPORT  PATIENT: Kara Harris, Kara Harris  MR#: [id number] BIRTHDATE: 06/23/1923 , 14  yrs. old GENDER: female ENDOSCOPIST:Jakyiah Briones Benson [place], MD REFERRED BY: PROCEDURE DATE:  2015/11/25 PROCEDURE:   EGD, diagnostic ASA CLASS:    Class III INDICATIONS: melena. MEDICATION: Monitored anesthesia care TOPICAL ANESTHETIC:   none  DESCRIPTION OF PROCEDURE:   After the risks and benefits of the procedure were explained, informed consent was obtained.  The Pentax Gastroscope [id number]  endoscope was introduced through the mouth  and advanced to the second portion of the duodenum .  The instrument was slowly withdrawn as the mucosa was fully examined. Estimated blood loss is zero unless otherwise noted in this procedure report.    FINDINGS: The esophagus was normal.  In the fundus and body of the stomach multiple polyps were identified.  These are most likely benign fundic gland polyps secondary to prolonged PPI use.  The duodenum was normal.  No evidence of masses, inflammation, ulcerations, erosions, or vascular abnormalities.          The scope was then withdrawn from the patient and the procedure completed.  COMPLICATIONS: There were no immediate complications.  ENDOSCOPIC IMPRESSION: 1) Benign gastric polyps.  RECOMMENDATIONS: 1) No further GI work up at this time.   _______________________________ eSignedCarol Ada, MD 2015-11-25 10:36 AM     cc:  CPT CODES: ICD CODES:  The ICD and CPT codes recommended by this software are interpretations from the data that the clinical staff has captured with the software.  The verification of the translation of this report to the ICD and CPT codes and modifiers is the sole responsibility of the health care institution and practicing physician where this report was generated.  [employer] will not be held responsible for the validity of  the ICD and CPT codes included on this report.  AMA assumes no liability for data contained or not contained herein. CPT is a Designer, television/film set of the [employer].

## 2015-11-07 NOTE — Interval H&P Note (Signed)
History and Physical Interval Note:  11/07/2015 9:10 AM  Kara Harris  has presented today for surgery, with the diagnosis of guaiac pos stool  The various methods of treatment have been discussed with the patient and family. After consideration of risks, benefits and other options for treatment, the patient has consented to  Procedure(s): ESOPHAGOGASTRODUODENOSCOPY (EGD) WITH PROPOFOL (N/A) as a surgical intervention .  The patient's history has been reviewed, patient examined, no change in status, stable for surgery.  I have reviewed the patient's chart and labs.  Questions were answered to the patient's satisfaction.     Mccayla Shimada D

## 2015-11-07 NOTE — Anesthesia Postprocedure Evaluation (Signed)
Anesthesia Post Note  Patient: Kara Harris  Procedure(s) Performed: Procedure(s) (LRB): ESOPHAGOGASTRODUODENOSCOPY (EGD) WITH PROPOFOL (N/A)  Patient location during evaluation: PACU Anesthesia Type: MAC Level of consciousness: awake and alert Pain management: pain level controlled Vital Signs Assessment: post-procedure vital signs reviewed and stable Respiratory status: spontaneous breathing, nonlabored ventilation, respiratory function stable and patient connected to nasal cannula oxygen Cardiovascular status: stable and blood pressure returned to baseline Anesthetic complications: no    Last Vitals:  Filed Vitals:   11/07/15 1100 11/07/15 1105  BP: 135/63 165/66  Pulse: 62   Temp:    Resp: 19     Last Pain: There were no vitals filed for this visit.               Kambrea Carrasco J

## 2015-11-07 NOTE — Anesthesia Preprocedure Evaluation (Addendum)
Anesthesia Evaluation  Patient identified by MRN, date of birth, ID band Patient awake    Reviewed: Allergy & Precautions, NPO status , Patient's Chart, lab work & pertinent test results  Airway Mallampati: II  TM Distance: >3 FB Neck ROM: Full    Dental no notable dental hx.    Pulmonary neg pulmonary ROS,    Pulmonary exam normal breath sounds clear to auscultation       Cardiovascular hypertension, Pt. on medications and Pt. on home beta blockers + Peripheral Vascular Disease  Normal cardiovascular exam Rhythm:Regular Rate:Normal     Neuro/Psych PSYCHIATRIC DISORDERS Anxiety Depression TIA Neuromuscular disease negative psych ROS   GI/Hepatic negative GI ROS, Neg liver ROS,   Endo/Other  diabetes, Type 2, Oral Hypoglycemic Agents  Renal/GU negative Renal ROS  negative genitourinary   Musculoskeletal negative musculoskeletal ROS (+)   Abdominal   Peds negative pediatric ROS (+)  Hematology  (+) anemia ,   Anesthesia Other Findings   Reproductive/Obstetrics negative OB ROS                           Anesthesia Physical Anesthesia Plan  ASA: III  Anesthesia Plan: MAC   Post-op Pain Management:    Induction: Intravenous  Airway Management Planned: Natural Airway  Additional Equipment:   Intra-op Plan:   Post-operative Plan: Extubation in OR  Informed Consent: I have reviewed the patients History and Physical, chart, labs and discussed the procedure including the risks, benefits and alternatives for the proposed anesthesia with the patient or authorized representative who has indicated his/her understanding and acceptance.   Dental advisory given  Plan Discussed with: CRNA  Anesthesia Plan Comments:        Anesthesia Quick Evaluation

## 2015-11-07 NOTE — Discharge Instructions (Signed)
Colonoscopy, Care After °Refer to this sheet in the next few weeks. These instructions provide you with information on caring for yourself after your procedure. Your health care provider may also give you more specific instructions. Your treatment has been planned according to current medical practices, but problems sometimes occur. Call your health care provider if you have any problems or questions after your procedure. °WHAT TO EXPECT AFTER THE PROCEDURE  °After your procedure, it is typical to have the following: °· A small amount of blood in your stool. °· Moderate amounts of gas and mild abdominal cramping or bloating. °HOME CARE INSTRUCTIONS °· Do not drive, operate machinery, or sign important documents for 24 hours. °· You may shower and resume your regular physical activities, but move at a slower pace for the first 24 hours. °· Take frequent rest periods for the first 24 hours. °· Walk around or put a warm pack on your abdomen to help reduce abdominal cramping and bloating. °· Drink enough fluids to keep your urine clear or pale yellow. °· You may resume your normal diet as instructed by your health care provider. Avoid heavy or fried foods that are hard to digest. °· Avoid drinking alcohol for 24 hours or as instructed by your health care provider. °· Only take over-the-counter or prescription medicines as directed by your health care provider. °· If a tissue sample (biopsy) was taken during your procedure: °¨ Do not take aspirin or blood thinners for 7 days, or as instructed by your health care provider. °¨ Do not drink alcohol for 7 days, or as instructed by your health care provider. °¨ Eat soft foods for the first 24 hours. °SEEK MEDICAL CARE IF: °You have persistent spotting of blood in your stool 2-3 days after the procedure. °SEEK IMMEDIATE MEDICAL CARE IF: °· You have more than a small spotting of blood in your stool. °· You pass large blood clots in your stool. °· Your abdomen is swollen  (distended). °· You have nausea or vomiting. °· You have a fever. °· You have increasing abdominal pain that is not relieved with medicine. °  °This information is not intended to replace advice given to you by your health care provider. Make sure you discuss any questions you have with your health care provider. °  °Document Released: 05/18/2004 Document Revised: 07/25/2013 Document Reviewed: 06/11/2013 °Elsevier Interactive Patient Education ©2016 [employer] ° °

## 2015-11-07 NOTE — H&P (View-Only) (Signed)
Spoke with [place] crews LPN [facility] [employer] phone [phone] to provide transportation and patient able to sign own consents

## 2015-11-07 NOTE — Transfer of Care (Signed)
Immediate Anesthesia Transfer of Care Note  Patient: Kara Harris  Procedure(s) Performed: Procedure(s): ESOPHAGOGASTRODUODENOSCOPY (EGD) WITH PROPOFOL (N/A)  Patient Location: PACU  Anesthesia Type:MAC  Level of Consciousness: awake, alert  and oriented  Airway & Oxygen Therapy: Patient Spontanous Breathing and Patient connected to nasal cannula oxygen  Post-op Assessment: Report given to RN and Post -op Vital signs reviewed and stable  Post vital signs: Reviewed and stable  Last Vitals:  Filed Vitals:   11/07/15 0915  BP: 150/60  Pulse: 60  Temp: 36.8 C  Resp: 14    Complications: No apparent anesthesia complications

## 2015-11-10 ENCOUNTER — Encounter (HOSPITAL_COMMUNITY): Payer: Self-pay | Admitting: Gastroenterology

## 2015-11-12 ENCOUNTER — Ambulatory Visit: Payer: Medicare Other

## 2015-11-12 DIAGNOSIS — M810 Age-related osteoporosis without current pathological fracture: Secondary | ICD-10-CM | POA: Diagnosis not present

## 2015-11-12 MED ORDER — DENOSUMAB 60 MG/ML ~~LOC~~ SOLN
60.0000 mg | Freq: Once | SUBCUTANEOUS | Status: AC
  Administered 2015-11-12: 60 mg via SUBCUTANEOUS

## 2015-11-27 DIAGNOSIS — E119 Type 2 diabetes mellitus without complications: Secondary | ICD-10-CM | POA: Diagnosis not present

## 2015-11-27 LAB — HEMOGLOBIN A1C
Hemoglobin A1C: 6.5
Hemoglobin A1C: 6.5

## 2015-12-03 ENCOUNTER — Encounter: Payer: Self-pay | Admitting: Internal Medicine

## 2015-12-03 ENCOUNTER — Non-Acute Institutional Stay: Payer: Medicare Other | Admitting: Internal Medicine

## 2015-12-03 VITALS — BP 120/68 | HR 62 | Temp 97.6°F

## 2015-12-03 DIAGNOSIS — K317 Polyp of stomach and duodenum: Secondary | ICD-10-CM | POA: Diagnosis not present

## 2015-12-03 DIAGNOSIS — K582 Mixed irritable bowel syndrome: Secondary | ICD-10-CM | POA: Diagnosis not present

## 2015-12-03 DIAGNOSIS — F03918 Unspecified dementia, unspecified severity, with other behavioral disturbance: Secondary | ICD-10-CM

## 2015-12-03 DIAGNOSIS — E1143 Type 2 diabetes mellitus with diabetic autonomic (poly)neuropathy: Secondary | ICD-10-CM | POA: Diagnosis not present

## 2015-12-03 DIAGNOSIS — F0391 Unspecified dementia with behavioral disturbance: Secondary | ICD-10-CM

## 2015-12-03 NOTE — Progress Notes (Signed)
Patient ID: Kara Harris, female   DOB: [birth year], 82 y.o.   MRN: [id number]   Location: [facility] Provider: Rexene Edison. Mariea Clonts, D.O., C.M.D.  Code Status: DNR Goals of Care: Advanced Directive information Does patient have an advance directive?: Yes, Type of Advance Directive: [place];Living will;Out of facility DNR (pink MOST or yellow form), Pre-existing out of facility DNR order (yellow form or pink MOST form): Yellow form placed in chart (order not valid for inpatient use)  Chief Complaint  Patient presents with  . Medical Management of Chronic Issues    medication management, blood pressure, blood sugar, depression, dementia    HPI: Patient is a 82 y.o. female seen in the office today for med mgt of chronic diseases.    Says she still has bad days with her mood.  [place] like living in [place].  Wishes she still had a cat.    Saw Dr. Benson Norway on 1/20, and underwent EGD which showed gastric polyps.  No other findings.  No intervention planned with her age and dementia.   No recent abdominal pain or diarrhea.    She's been off metformin since last visit.  Has not had diarrhea since then.   Was not really diarrhea, just more frequent bms.  Sugar average was 6.5 on 11/27/15.  C/o being awoken for the bloodwork.    Says she needs a thinkpad to help her memory.  Admits she's not as sharp as she was, but considering, she's doing pretty good.   Review of Systems:  Review of Systems  Constitutional: Negative for fever and chills.  HENT: Negative for congestion.   Eyes: Negative for blurred vision.       Glasses  Respiratory: Negative for shortness of breath.   Cardiovascular: Positive for leg swelling. Negative for chest pain.       Wearing compression hose  Gastrointestinal: Negative for abdominal pain, diarrhea, constipation, blood in stool and melena.  Genitourinary: Negative for dysuria.  Musculoskeletal: Negative for falls.  Neurological: Negative for  dizziness.  Psychiatric/Behavioral: Positive for depression and memory loss.    Past Medical History  Diagnosis Date  . Hyperlipidemia   . Neuropathy ([place])   . Paroxysmal atrial tachycardia ([place])   . Diabetes mellitus   . Venous insufficiency   . Anxiety   . Depressive disorder   . Plantar fascial fibromatosis   . Insomnia   . Dysphagia   . TIA (transient ischemic attack)   . Rickets, active   . Hypertension   . Basilar artery syndrome   . Other atopic dermatitis and related conditions 12/13/2012  . Diarrhea 12/13/2012  . Peripheral vascular disease, unspecified ([place]) 08/14/2012  . Fecal smearing 04/05/2012  . Unspecified hereditary and idiopathic peripheral neuropathy 03/17/2011  . Macular degeneration (senile) of retina, unspecified 03/17/2011  . Rash and other nonspecific skin eruption 12/21/2010  . Closed fracture of intertrochanteric section of femur ([place]) 03/04/2010  . Spinal stenosis, unspecified region other than cervical 11/18/2008  . Pain in limb 11/18/2008  . Abnormality of gait 11/18/2008  . Pain in joint, pelvic region and thigh 10/2008  . Pain in joint, ankle and foot 08/07/2008  . Unspecified urinary incontinence 05/13/2008  . Corns and callosities 02/05/2008  . Vitamin D deficiency 11/07/2007  . Edema 10/25/2007  . Transient ischemic attack (TIA), and cerebral infarction without residual deficits 10/28/2002  . Type II or unspecified type diabetes mellitus with peripheral circulatory disorders, uncontrolled(250.72) 02/12/2013  . Osteoporosis, senile  Past Surgical History  Procedure Laterality Date  . Hip fracture surgery Right 02/2010  . Cholecystectomy  1964  . Tonsillectomy and adenoidectomy      as a child  . Breast mass excision    . Incision / drainage hand / finger  2001    cat bite  . Dilation and curettage of uterus  2003    post menopausal bleeding. Bicornate uterus/doble cervix, benign path  . Colonoscopy  12/18/2010    Dr Oletta Lamas  .  Esophagogastroduodenoscopy endoscopy  12/18/2010    Dr. Oletta Lamas slight gastritis  . Esophagogastroduodenoscopy (egd) with propofol N/A 11/07/2015    Procedure: ESOPHAGOGASTRODUODENOSCOPY (EGD) WITH PROPOFOL;  Surgeon: Carol Ada, MD;  Location: [facility];  Service: Endoscopy;  Laterality: N/A;    Allergies  Allergen Reactions  . Codeine     UNKNOWN  . Tramadol     UNKNOWN  . Vesicare [Solifenacin Succinate]     UNKNOWN      Medication List       This list is accurate as of: 12/03/15  1:48 PM.  Always use your most recent med list.               acetaminophen 500 MG tablet  Commonly known as:  TYLENOL  Take 500 mg by mouth every 4 (four) hours as needed for moderate pain, fever or headache (severe pain).     atenolol 25 MG tablet  Commonly known as:  TENORMIN  Take 12.5 mg by mouth every morning.     calcium-vitamin D 500-200 MG-UNIT tablet  Commonly known as:  OSCAL WITH D  Take 1 tablet by mouth every morning.     cetaphil cream  Apply 1 application topically as needed (for dry skin).     eucerin lotion  Apply 1 mL topically as needed for dry skin.     clopidogrel 75 MG tablet  Commonly known as:  PLAVIX  Take 1 tablet (75 mg total) by mouth daily.     diphenhydrAMINE 12.5 MG/5ML ELIX 20 mL, aluminum & magnesium hydroxide-simethicone 400-400-40 MG/5ML SUSP 20 mL, lidocaine 2 % SOLN 20 mL  Swish and spit 15 mLs 2 (two) times daily as needed (for mouth irritation). Rinse then spit out     gabapentin 100 MG capsule  Commonly known as:  NEURONTIN  Take 100 mg by mouth 3 (three) times daily.     linagliptin 5 MG Tabs tablet  Commonly known as:  TRADJENTA  Take 5 mg by mouth daily. Every evening     loperamide 2 MG tablet  Commonly known as:  IMODIUM A-D  Take 2 mg by mouth. l tablet after each loose stools as needed for diarrhea. No more than 4 tablets a day     NAMENDA XR 28 MG Cp24 24 hr capsule  Generic drug:  memantine  Take 28 mg by mouth every  morning.     pantoprazole 40 MG tablet  Commonly known as:  PROTONIX  Take 1 tablet (40 mg total) by mouth 2 (two) times daily before a meal.     sertraline 25 MG tablet  Commonly known as:  ZOLOFT  Take 25 mg by mouth every morning.     spironolactone 50 MG tablet  Commonly known as:  ALDACTONE  Take 50 mg by mouth 2 (two) times daily.     vitamin B-12 1000 MCG tablet  Commonly known as:  CYANOCOBALAMIN  Take 1,000 mcg by mouth every morning. Take one tablet daily  Health Maintenance  Topic Date Due  . OPHTHALMOLOGY EXAM  06/21/1933  . URINE MICROALBUMIN  06/21/1935  . ZOSTAVAX  06/20/1985  . FOOT EXAM  09/03/2015  . HEMOGLOBIN A1C  03/08/2016  . INFLUENZA VACCINE  05/18/2016  . TETANUS/TDAP  11/03/2021  . DEXA SCAN  Completed  . PNA vac Low Risk Adult  Completed    Physical Exam: Filed Vitals:   12/03/15 1340  BP: 120/68  Pulse: 62  Temp: 97.6 F (36.4 C)  TempSrc: Oral  SpO2: 94%   There is no weight on file to calculate BMI.refused to stand up even with help for weight  Physical Exam  Constitutional: She appears well-developed and well-nourished.  Cardiovascular: Normal rate, regular rhythm and normal heart sounds.   Wearing compression hose  Pulmonary/Chest: Effort normal and breath sounds normal. No respiratory distress.  Abdominal: Soft. Bowel sounds are normal. She exhibits no distension. There is no tenderness.  Musculoskeletal:  Gets around in her manual wheelchair  Neurological: She is alert.  Skin: Skin is warm and dry.    Labs reviewed: Basic Metabolic Panel:  Recent Labs  02/27/15 08/27/15 09/09/15  NA 140 137 138  K 4.2 4.3 5.0  BUN 11 10 13   CREATININE 0.8 0.8 0.8  TSH  --  1.69  --    Liver Function Tests:  Recent Labs  02/27/15 08/27/15  AST 15 14  ALT 14 12  ALKPHOS 68 52   No results for input(s): LIPASE, AMYLASE in the last 8760 hours. No results for input(s): AMMONIA in the last 8760 hours. CBC:  Recent  Labs  02/27/15 08/27/15  WBC 10.5 11.1  HGB 14.0 14.2  HCT 42 42  PLT 376 354   Lipid Panel:  Recent Labs  02/27/15 08/27/15  CHOL 185 173  HDL 43 38  LDLCALC  --  102  TRIG 183* 163*   Lab Results  Component Value Date   HGBA1C 6.5 11/27/2015   EGD reviewed in hpi  Assessment/Plan 1. Dementia with behavioral disturbance -continue namenda, gradually needing more personal care help  2. Irritable bowel syndrome with both constipation and diarrhea -seems this is better off metformin but not entirely clear  3. Gastric polyps -noted on EGD--sometimes these cause diarrhea--monitor -no intervention of polyps planned and felt to be benign  4. Type II diabetes mellitus with peripheral autonomic neuropathy (HCC) -decrease cbgs to every other week -cont tradjenta  Labs/tests ordered:   Orders Placed This Encounter  Procedures  . Hemoglobin A1c    This external order was created through the Results Console.  no labs before next appt, will check at 6 mos  Next appt:  3 mos med mgt  Kara Harris, D.O. [facility] [address], [place] [address] Cell Phone (Mon-Fri 8am-5pm):  [phone] On Call:  [phone] & follow prompts after 5pm & weekends Office Phone:  [phone] Office Fax:  [phone]

## 2016-03-01 ENCOUNTER — Encounter: Payer: Self-pay | Admitting: Adult Health

## 2016-03-01 ENCOUNTER — Non-Acute Institutional Stay: Payer: Medicare Other | Admitting: Adult Health

## 2016-03-01 DIAGNOSIS — E1159 Type 2 diabetes mellitus with other circulatory complications: Secondary | ICD-10-CM | POA: Diagnosis not present

## 2016-03-01 DIAGNOSIS — R635 Abnormal weight gain: Secondary | ICD-10-CM | POA: Diagnosis not present

## 2016-03-01 DIAGNOSIS — I872 Venous insufficiency (chronic) (peripheral): Secondary | ICD-10-CM | POA: Diagnosis not present

## 2016-03-01 NOTE — Progress Notes (Signed)
Patient ID: Kara Harris, female   DOB: [birth year], 82 y.o.   MRN: [id number]  Location:  [place]:  ALF (13) Provider:   Cindi Carbon, ANP [place] [phone]   REED, Jonelle Sidle, DO  Patient Care Team: Gayland Curry, DO as PCP - General (Geriatric Medicine) [facility] Laurence Spates, MD as Consulting Physician (Gastroenterology) Lorretta Harp, MD as Consulting Physician (Cardiology) Garvin Fila, MD as Consulting Physician (Neurology) Bjorn Loser, MD as Consulting Physician (Urology) Pedro Earls, MD as Attending Physician (Family Medicine)  Extended Emergency Contact Information Primary Emergency Contact: [name] Phone: [phone] Relation: Friend Secondary Emergency Contact: [facility]  [place] Phone: [phone] Relation: Other  Code Status:  DNR Goals of care: Advanced Directive information Advanced Directives 12/03/2015  Does patient have an advance directive? Yes  Type of Paramedic of [place];Living will;Out of facility DNR (pink MOST or yellow form)  Copy of advanced directive(s) in chart? Yes  Pre-existing out of facility DNR order (yellow form or pink MOST form) Yellow form placed in chart (order not valid for inpatient use)     Chief Complaint  Patient presents with  . Acute Visit    erythema to BLE    HPI:  Pt is a 82 y.o. female seen today for an acute visit for bilateral erythema to legs. She has not had any pain or fever. This is a long term issue that is slightly worse according to the staff. She currently uses ted hose and eucerin cream daily. She has not had any venous or diabetic ulcers. She was started on tradjenta in Nov and her CBGs have ranged 175-206.  The staff reports she is overeating (lots of sweets and toast).  Her weight has trended up from 164 in Jan to  181 earlier this month.  This is only a 1 lb gain from the previous month, but overall a significant gain for the year. She has no recent echo for review but denies any sob or chest pain. The staff reports that she is sedentary and spends most of the day eating.    Past Medical History  Diagnosis Date  . Hyperlipidemia   . Neuropathy ([place])   . Paroxysmal atrial tachycardia ([place])   . Diabetes mellitus   . Venous insufficiency   . Anxiety   . Depressive disorder   . Plantar fascial fibromatosis   . Insomnia   . Dysphagia   . TIA (transient ischemic attack)   . Rickets, active   . Hypertension   . Basilar artery syndrome   . Other atopic dermatitis and related conditions 12/13/2012  . Diarrhea 12/13/2012  . Peripheral vascular disease, unspecified ([place]) 08/14/2012  . Fecal smearing 04/05/2012  . Unspecified hereditary and idiopathic peripheral neuropathy 03/17/2011  . Macular degeneration (senile) of retina, unspecified 03/17/2011  . Rash and other nonspecific skin eruption 12/21/2010  . Closed fracture of intertrochanteric section of femur ([place]) 03/04/2010  . Spinal stenosis, unspecified region other than cervical 11/18/2008  . Pain in limb 11/18/2008  . Abnormality of gait 11/18/2008  . Pain in joint, pelvic region and thigh 10/2008  . Pain in joint, ankle and foot 08/07/2008  . Unspecified urinary incontinence 05/13/2008  . Corns and callosities 02/05/2008  . Vitamin D deficiency 11/07/2007  . Edema 10/25/2007  . Transient ischemic attack (TIA), and cerebral infarction without residual deficits 10/28/2002  .  Type II or unspecified type diabetes mellitus with peripheral circulatory disorders, uncontrolled(250.72) 02/12/2013  . Osteoporosis, senile    Past Surgical History  Procedure Laterality Date  . Hip fracture surgery Right 02/2010  . Cholecystectomy  1964  . Tonsillectomy and adenoidectomy      as a child  . Breast mass excision    . Incision / drainage hand / finger  2001    cat bite    . Dilation and curettage of uterus  2003    post menopausal bleeding. Bicornate uterus/doble cervix, benign path  . Colonoscopy  12/18/2010    Dr Oletta Lamas  . Esophagogastroduodenoscopy endoscopy  12/18/2010    Dr. Oletta Lamas slight gastritis  . Esophagogastroduodenoscopy (egd) with propofol N/A 11/07/2015    Procedure: ESOPHAGOGASTRODUODENOSCOPY (EGD) WITH PROPOFOL;  Surgeon: Carol Ada, MD;  Location: [facility] ENDOSCOPY;  Service: Endoscopy;  Laterality: N/A;    Allergies  Allergen Reactions  . Codeine     UNKNOWN  . Tramadol     UNKNOWN  . Vesicare [Solifenacin Succinate]     UNKNOWN      Medication List       This list is accurate as of: 03/01/16 12:15 PM.  Always use your most recent med list.               acetaminophen 500 MG tablet  Commonly known as:  TYLENOL  Take 500 mg by mouth every 4 (four) hours as needed for moderate pain, fever or headache (severe pain).     atenolol 25 MG tablet  Commonly known as:  TENORMIN  Take 12.5 mg by mouth every morning.     calcium-vitamin D 500-200 MG-UNIT tablet  Commonly known as:  OSCAL WITH D  Take 1 tablet by mouth every morning.     cetaphil cream  Apply 1 application topically as needed (for dry skin).     eucerin lotion  Apply 1 mL topically as needed for dry skin.     clopidogrel 75 MG tablet  Commonly known as:  PLAVIX  Take 1 tablet (75 mg total) by mouth daily.     diphenhydrAMINE 12.5 MG/5ML ELIX 20 mL, aluminum & magnesium hydroxide-simethicone 400-400-40 MG/5ML SUSP 20 mL, lidocaine 2 % SOLN 20 mL  Swish and spit 15 mLs 2 (two) times daily as needed (for mouth irritation). Rinse then spit out     gabapentin 100 MG capsule  Commonly known as:  NEURONTIN  Take 100 mg by mouth 3 (three) times daily.     linagliptin 5 MG Tabs tablet  Commonly known as:  TRADJENTA  Take 5 mg by mouth daily. Every evening     loperamide 2 MG tablet  Commonly known as:  IMODIUM A-D  Take 2 mg by mouth. l tablet after each loose  stools as needed for diarrhea. No more than 4 tablets a day     NAMENDA XR 28 MG Cp24 24 hr capsule  Generic drug:  memantine  Take 28 mg by mouth every morning.     pantoprazole 40 MG tablet  Commonly known as:  PROTONIX  Take 1 tablet (40 mg total) by mouth 2 (two) times daily before a meal.     sertraline 25 MG tablet  Commonly known as:  ZOLOFT  Take 25 mg by mouth every morning.     spironolactone 50 MG tablet  Commonly known as:  ALDACTONE  Take 50 mg by mouth 2 (two) times daily.     vitamin B-12 1000 MCG  tablet  Commonly known as:  CYANOCOBALAMIN  Take 1,000 mcg by mouth every morning. Take one tablet daily        Review of Systems  Constitutional: Positive for appetite change and unexpected weight change. Negative for fever, chills, diaphoresis, activity change and fatigue.  Respiratory: Negative for cough and shortness of breath.   Cardiovascular: Positive for leg swelling. Negative for chest pain and palpitations.  Musculoskeletal: Positive for gait problem.  Skin: Positive for rash. Negative for color change, pallor and wound.  Psychiatric/Behavioral: Positive for confusion. Negative for behavioral problems and agitation.    Immunization History  Administered Date(s) Administered  . Influenza Whole 08/01/2013  . Influenza-Unspecified 08/01/2014, 07/31/2015  . PPD Test 03/09/2012  . Pneumococcal Conjugate-13 03/04/2015  . Pneumococcal Polysaccharide-23 10/18/1998  . Td 11/04/2011   Pertinent  Health Maintenance Due  Topic Date Due  . OPHTHALMOLOGY EXAM  06/21/1933  . URINE MICROALBUMIN  06/21/1933  . FOOT EXAM  09/03/2015  . INFLUENZA VACCINE  05/18/2016  . HEMOGLOBIN A1C  05/26/2016  . DEXA SCAN  Completed  . PNA vac Low Risk Adult  Completed   Fall Risk  09/10/2015 09/02/2014 08/29/2013  Falls in the past year? No No No  Risk for fall due to : History of fall(s);Impaired balance/gait;Impaired mobility;Mental status change - -   Functional Status  Survey:    Filed Vitals:   03/01/16 1213  BP: 149/76  Pulse: 69  Temp: 97.8 F (36.6 C)  Resp: 18  Weight: 181 lb (82.101 kg)  SpO2: 93%   Body mass index is 28.34 kg/(m^2). Physical Exam  Constitutional: She is oriented to person, place, and time. No distress.  Neck: No JVD present.  Cardiovascular: Normal rate and regular rhythm.   No murmur heard. BLE edema +1  Pulmonary/Chest: Effort normal and breath sounds normal. No respiratory distress.  Abdominal: Soft. Bowel sounds are normal. She exhibits no distension.  rotund  Neurological: She is alert and oriented to person, place, and time.  Skin: Skin is warm and dry. She is not diaphoretic. There is erythema (BLE without drainage or open areas unchanged).  Psychiatric: She has a normal mood and affect.    Labs reviewed:  Recent Labs  08/27/15 09/09/15  NA 137 138  K 4.3 5.0  BUN 10 13  CREATININE 0.8 0.8    Recent Labs  08/27/15  AST 14  ALT 12  ALKPHOS 52    Recent Labs  08/27/15  WBC 11.1  HGB 14.2  HCT 42  PLT 354   Lab Results  Component Value Date   TSH 1.69 08/27/2015   Lab Results  Component Value Date   HGBA1C 6.5 11/27/2015   Lab Results  Component Value Date   CHOL 173 08/27/2015   HDL 38 08/27/2015   LDLCALC 102 08/27/2015   TRIG 163* 08/27/2015    Significant Diagnostic Results in last 30 days:  No results found.  Assessment/Plan 1. Venous insufficiency -bilateral mild erythema without pain or fever, try triamcinolone 0.1% BID for 7 days -continue ted hose and leg elevation  2. Weight gain -significant since Jan -try smaller portions and focus on protein sources rather than carbs (difficult for her to do due to dementia) -weight again today and report if weight >5lb gain since May 1  3. Type 2 diabetes mellitus with other circulatory complication, without long-term current use of insulin (HCC) -CBGs trending slightly upward due to diet -continue tradjenta, check A1C -if  elevated would consider changing  meds but given her age there is no need to be too aggressive   Family/ staff Communication: discussed with resident and staff  Labs/tests ordered:  CBC A1C  Cindi Carbon, [place] [phone]

## 2016-03-02 DIAGNOSIS — E119 Type 2 diabetes mellitus without complications: Secondary | ICD-10-CM | POA: Diagnosis not present

## 2016-03-02 DIAGNOSIS — D649 Anemia, unspecified: Secondary | ICD-10-CM | POA: Diagnosis not present

## 2016-03-02 LAB — CBC AND DIFFERENTIAL
HCT: 46 % (ref 36–46)
Hemoglobin: 14.1 g/dL (ref 12.0–16.0)
Platelets: 352 10*3/uL (ref 150–399)
WBC: 10.6 10^3/mL

## 2016-03-02 LAB — HEMOGLOBIN A1C: Hemoglobin A1C: 7.7

## 2016-03-10 ENCOUNTER — Encounter: Payer: Self-pay | Admitting: Internal Medicine

## 2016-03-10 ENCOUNTER — Non-Acute Institutional Stay: Payer: Medicare Other | Admitting: Internal Medicine

## 2016-03-10 VITALS — BP 112/70 | HR 67 | Temp 98.5°F | Ht 67.0 in | Wt 180.0 lb

## 2016-03-10 DIAGNOSIS — E1159 Type 2 diabetes mellitus with other circulatory complications: Secondary | ICD-10-CM | POA: Diagnosis not present

## 2016-03-10 DIAGNOSIS — F0391 Unspecified dementia with behavioral disturbance: Secondary | ICD-10-CM

## 2016-03-10 DIAGNOSIS — F32A Depression, unspecified: Secondary | ICD-10-CM

## 2016-03-10 DIAGNOSIS — I872 Venous insufficiency (chronic) (peripheral): Secondary | ICD-10-CM

## 2016-03-10 DIAGNOSIS — F329 Major depressive disorder, single episode, unspecified: Secondary | ICD-10-CM

## 2016-03-10 DIAGNOSIS — K317 Polyp of stomach and duodenum: Secondary | ICD-10-CM | POA: Diagnosis not present

## 2016-03-10 DIAGNOSIS — F03918 Unspecified dementia, unspecified severity, with other behavioral disturbance: Secondary | ICD-10-CM

## 2016-03-10 NOTE — Progress Notes (Signed)
Location:  Occupational psychologist of Service:  Clinic (12)  Provider: Nero Sawatzky L. Mariea Clonts, D.O., C.M.D.  Code Status: DNR  Goals of Care:  Advanced Directives 03/10/2016  Does patient have an advance directive? Yes  Type of Paramedic of [place];Out of facility DNR (pink MOST or yellow form)  Copy of advanced directive(s) in chart? Yes  Pre-existing out of facility DNR order (yellow form or pink MOST form) Yellow form placed in chart (order not valid for inpatient use)     Chief Complaint  Patient presents with  . Medical Management of Chronic Issues    3 mth follow-up    HPI: Patient is a 82 y.o. female seen today for medical management of chronic diseases.    Is stressed about new credit card already being compromised.  Has a lot of family issues as well.  Is on zoloft low dose.    Doing ok physically.  Still gets some funny feelings around her mid abdomen.  Had her stomach checked and the EGD by Dr. Benson Norway showed gastric polyps.   Living on PB and J and pimento cheese sandwiches.    Has been eating ice cream before bedtime.  Sugar average has gone up to 7.7. She says she did fine eating it for so long but has recently cut down since labs returned.    Past Medical History  Diagnosis Date  . Hyperlipidemia   . Neuropathy ([place])   . Paroxysmal atrial tachycardia ([place])   . Diabetes mellitus   . Venous insufficiency   . Anxiety   . Depressive disorder   . Plantar fascial fibromatosis   . Insomnia   . Dysphagia   . TIA (transient ischemic attack)   . Rickets, active   . Hypertension   . Basilar artery syndrome   . Other atopic dermatitis and related conditions 12/13/2012  . Diarrhea 12/13/2012  . Peripheral vascular disease, unspecified ([place]) 08/14/2012  . Fecal smearing 04/05/2012  . Unspecified hereditary and idiopathic peripheral neuropathy 03/17/2011  . Macular degeneration (senile) of retina, unspecified 03/17/2011  . Rash and  other nonspecific skin eruption 12/21/2010  . Closed fracture of intertrochanteric section of femur ([place]) 03/04/2010  . Spinal stenosis, unspecified region other than cervical 11/18/2008  . Pain in limb 11/18/2008  . Abnormality of gait 11/18/2008  . Pain in joint, pelvic region and thigh 10/2008  . Pain in joint, ankle and foot 08/07/2008  . Unspecified urinary incontinence 05/13/2008  . Corns and callosities 02/05/2008  . Vitamin D deficiency 11/07/2007  . Edema 10/25/2007  . Transient ischemic attack (TIA), and cerebral infarction without residual deficits 10/28/2002  . Type II or unspecified type diabetes mellitus with peripheral circulatory disorders, uncontrolled(250.72) 02/12/2013  . Osteoporosis, senile     Past Surgical History  Procedure Laterality Date  . Hip fracture surgery Right 02/2010  . Cholecystectomy  1964  . Tonsillectomy and adenoidectomy      as a child  . Breast mass excision    . Incision / drainage hand / finger  2001    cat bite  . Dilation and curettage of uterus  2003    post menopausal bleeding. Bicornate uterus/doble cervix, benign path  . Colonoscopy  12/18/2010    Dr Oletta Lamas  . Esophagogastroduodenoscopy endoscopy  12/18/2010    Dr. Oletta Lamas slight gastritis  . Esophagogastroduodenoscopy (egd) with propofol N/A 11/07/2015    Procedure: ESOPHAGOGASTRODUODENOSCOPY (EGD) WITH PROPOFOL;  Surgeon: Carol Ada, MD;  Location: [facility];  Service: Endoscopy;  Laterality: N/A;    Allergies  Allergen Reactions  . Codeine     UNKNOWN  . Tramadol     UNKNOWN  . Vesicare [Solifenacin Succinate]     UNKNOWN      Medication List       This list is accurate as of: 03/10/16  1:52 PM.  Always use your most recent med list.               acetaminophen 500 MG tablet  Commonly known as:  TYLENOL  Take 500 mg by mouth every 4 (four) hours as needed for moderate pain, fever or headache (severe pain).     atenolol 25 MG tablet  Commonly known as:  TENORMIN  Take 12.5  mg by mouth every morning.     calcium-vitamin D 500-200 MG-UNIT tablet  Commonly known as:  OSCAL WITH D  Take 1 tablet by mouth every morning.     clopidogrel 75 MG tablet  Commonly known as:  PLAVIX  Take 1 tablet (75 mg total) by mouth daily.     eucerin lotion  Apply 1 mL topically as needed for dry skin.     gabapentin 100 MG capsule  Commonly known as:  NEURONTIN  Take 100 mg by mouth 3 (three) times daily.     linagliptin 5 MG Tabs tablet  Commonly known as:  TRADJENTA  Take 5 mg by mouth daily. Every evening     loperamide 2 MG tablet  Commonly known as:  IMODIUM A-D  l tablet after each loose stools as needed for diarrhea. No more than 4 tablets a day     MAALOX ADVANCED 200-200-20 MG/5ML suspension  Generic drug:  alum & mag hydroxide-simeth  Take by mouth 2 (two) times daily as needed for indigestion or heartburn.     NAMENDA XR 28 MG Cp24 24 hr capsule  Generic drug:  memantine  Take 28 mg by mouth every morning.     Omega-3 300 MG Caps  Take by mouth daily.     pantoprazole 40 MG tablet  Commonly known as:  PROTONIX  Take 1 tablet (40 mg total) by mouth 2 (two) times daily before a meal.     Probiotic Caps  Take by mouth daily.     sertraline 25 MG tablet  Commonly known as:  ZOLOFT  Take 25 mg by mouth every morning.     spironolactone 50 MG tablet  Commonly known as:  ALDACTONE  Take 50 mg by mouth 2 (two) times daily.     vitamin B-12 1000 MCG tablet  Commonly known as:  CYANOCOBALAMIN  Take 1,000 mcg by mouth every morning. Take one tablet daily        Review of Systems:  Review of Systems  Constitutional: Negative for fever and chills.  HENT: Negative for congestion.   Eyes:       Glasses  Respiratory: Negative for shortness of breath.   Cardiovascular: Negative for chest pain and leg swelling.       Edema improved with compression hose  Gastrointestinal: Positive for abdominal pain. Negative for diarrhea, constipation, blood in  stool and melena.  Genitourinary: Negative for dysuria.  Musculoskeletal: Negative for falls.  Skin: Negative for rash.       Redness of legs  Neurological: Negative for dizziness.  Psychiatric/Behavioral: Positive for depression and memory loss. The patient does not have insomnia.     Health Maintenance  Topic Date Due  .  OPHTHALMOLOGY EXAM  06/21/1933  . URINE MICROALBUMIN  [birth year]1935  . ZOSTAVAX  06/20/1985  . FOOT EXAM  09/03/2015  . INFLUENZA VACCINE  05/18/2016  . HEMOGLOBIN A1C  05/26/2016  . TETANUS/TDAP  11/03/2021  . DEXA SCAN  Completed  . PNA vac Low Risk Adult  Completed    Physical Exam: Filed Vitals:   03/10/16 1339  BP: 112/70  Pulse: 67  Temp: 98.5 F (36.9 C)  TempSrc: Oral  Height: 5\' 7"  (1.702 m)  Weight: 180 lb (81.647 kg)  SpO2: 93%   Body mass index is 28.19 kg/(m^2). Physical Exam  Constitutional: She is oriented to person, place, and time. She appears well-developed and well-nourished. No distress.  Cardiovascular: Normal rate, regular rhythm, normal heart sounds and intact distal pulses.   Pulmonary/Chest: Effort normal and breath sounds normal. No respiratory distress.  Abdominal: Soft. Bowel sounds are normal. She exhibits no distension and no mass. There is no tenderness. There is no rebound and no guarding.  Musculoskeletal: Normal range of motion.  Comes to clinic in wheelchair  Neurological: She is alert and oriented to person, place, and time.  Short term memory loss  Skin: Skin is warm and dry.  Psychiatric: She has a normal mood and affect.    Labs reviewed: Basic Metabolic Panel:  Recent Labs  08/27/15 09/09/15  NA 137 138  K 4.3 5.0  BUN 10 13  CREATININE 0.8 0.8  TSH 1.69  --    Liver Function Tests:  Recent Labs  08/27/15  AST 14  ALT 12  ALKPHOS 52   No results for input(s): LIPASE, AMYLASE in the last 8760 hours. No results for input(s): AMMONIA in the last 8760 hours. CBC:  Recent Labs  08/27/15  03/02/16 1108  WBC 11.1 10.6  HGB 14.2 14.1  HCT 42 46  PLT 354 352   Lipid Panel:  Recent Labs  08/27/15  CHOL 173  HDL 38  LDLCALC 102  TRIG 163*   Lab Results  Component Value Date   HGBA1C 7.7 03/02/2016    Assessment/Plan 1. Type 2 diabetes mellitus with other circulatory complication, without long-term current use of insulin (HCC) -hba1c trended up with increased ice cream intake--says she has cut back since results returned -also eats a lot of pb and j and pimento cheese which will increase glucose -cont tradjenta for glucose control at largest meal  2. Gastric polyps -reports feeling full fast and having some loose stools (has been diagnosed historically with IBS, but the polyps could be a contributor)  3. Venous insufficiency -suspect also has some PAD, to elevate feet at rest, does not like compression hose  4. Depression -cont on zoloft--she tells me that she doesn't know how it got started, but after thinking about it, starts telling me all of the things in her family she worries about regularly   5. Dementia with behavioral disturbance -continues AL level of care with some personal care assistance -cont namenda XR also which she tolerates well   Labs/tests ordered:   Orders Placed This Encounter  Procedures  . CBC and differential    This external order was created through the Results Console.  . Hemoglobin A1c    This external order was created through the Results Console.   Next appt:  05/12/2016  Asiana Benninger L. Buel Molder, D.O. [facility] [address], [place] [address] Cell Phone (Mon-Fri 8am-5pm):  [phone] On Call:  [phone] & follow prompts  after 5pm & weekends Office Phone:  [phone] Office Fax:  [phone]

## 2016-03-15 DIAGNOSIS — F0391 Unspecified dementia with behavioral disturbance: Secondary | ICD-10-CM | POA: Insufficient documentation

## 2016-03-15 DIAGNOSIS — F32A Depression, unspecified: Secondary | ICD-10-CM | POA: Insufficient documentation

## 2016-03-15 DIAGNOSIS — F329 Major depressive disorder, single episode, unspecified: Secondary | ICD-10-CM | POA: Insufficient documentation

## 2016-03-15 DIAGNOSIS — E1159 Type 2 diabetes mellitus with other circulatory complications: Secondary | ICD-10-CM | POA: Insufficient documentation

## 2016-03-15 DIAGNOSIS — K317 Polyp of stomach and duodenum: Secondary | ICD-10-CM | POA: Insufficient documentation

## 2016-03-15 DIAGNOSIS — F03918 Unspecified dementia, unspecified severity, with other behavioral disturbance: Secondary | ICD-10-CM | POA: Insufficient documentation

## 2016-04-22 ENCOUNTER — Non-Acute Institutional Stay: Payer: Medicare Other | Admitting: Adult Health

## 2016-04-22 DIAGNOSIS — M7989 Other specified soft tissue disorders: Secondary | ICD-10-CM | POA: Diagnosis not present

## 2016-04-22 DIAGNOSIS — I8311 Varicose veins of right lower extremity with inflammation: Secondary | ICD-10-CM

## 2016-04-22 DIAGNOSIS — E1159 Type 2 diabetes mellitus with other circulatory complications: Secondary | ICD-10-CM | POA: Diagnosis not present

## 2016-04-22 DIAGNOSIS — M799 Soft tissue disorder, unspecified: Secondary | ICD-10-CM

## 2016-04-22 DIAGNOSIS — L97529 Non-pressure chronic ulcer of other part of left foot with unspecified severity: Secondary | ICD-10-CM | POA: Insufficient documentation

## 2016-04-22 DIAGNOSIS — I872 Venous insufficiency (chronic) (peripheral): Secondary | ICD-10-CM

## 2016-04-22 DIAGNOSIS — I8312 Varicose veins of left lower extremity with inflammation: Secondary | ICD-10-CM | POA: Diagnosis not present

## 2016-04-22 NOTE — Progress Notes (Signed)
Patient ID: Kara Harris, female   DOB: 06/27/1923, 81 y.o.   MRN: [id number]  Location:      Place of Service:  [facility] (31) Provider:   Cindi Carbon, ANP [place] [phone]   REED, Jonelle Sidle, DO  Patient Care Team: Gayland Curry, DO as PCP - General (Geriatric Medicine) [facility] Laurence Spates, MD as Consulting Physician (Gastroenterology) Lorretta Harp, MD as Consulting Physician (Cardiology) Garvin Fila, MD as Consulting Physician (Neurology) Bjorn Loser, MD as Consulting Physician (Urology) Pedro Earls, MD as Attending Physician [facility] Medicine)  Extended Emergency Contact Information Primary Emergency Contact: Allena Earing [place] Phone: [phone] Relation: Friend Secondary Emergency Contact: [facility]  [place] Phone: [phone] Relation: Other  Code Status:  DNR Goals of care: Advanced Directive information Advanced Directives 03/10/2016  Does patient have an advance directive? Yes  Type of Paramedic of [place];Out of facility DNR (pink MOST or yellow form)  Copy of advanced directive(s) in chart? Yes  Pre-existing out of facility DNR order (yellow form or pink MOST form) Yellow form placed in chart (order not valid for inpatient use)      HPI:  Pt is a 82 y.o. female seen today for an acute visit for raised reddened area to left foot tip of great toe. She has not had any pain or fever. Previous toe nail removal 11/16 with nail growth returning.  Hx of PVD as long term issue. She currently is encouraged to use ted hose and eucerin cream daily to extremities. She has not had any venous or diabetic ulcers in the past. She was started on tradjenta in Nov and her CBGs have ranged 175-195.  Last A1C 5/17 was 7.7. The staff reports she is eating large amounts of carbs.  Patient reports recent adaptation of a vegan diet,  however states she will each cheese, eggs, and fish. Her current diet consists mainly of peanut butter and jelly and pimento cheese sandwiches, and raisin bread. Several candies noted surrounding her bedside. Her weight has trended up since Jan, however is down 3lbs since May.   The staff reports that she spends most of the day in her room alone.     Past Medical History  Diagnosis Date  . Hyperlipidemia   . Neuropathy ([place])   . Paroxysmal atrial tachycardia ([place])   . Diabetes mellitus   . Venous insufficiency   . Anxiety   . Depressive disorder   . Plantar fascial fibromatosis   . Insomnia   . Dysphagia   . TIA (transient ischemic attack)   . Rickets, active   . Hypertension   . Basilar artery syndrome   . Other atopic dermatitis and related conditions 12/13/2012  . Diarrhea 12/13/2012  . Peripheral vascular disease, unspecified ([place]) 08/14/2012  . Fecal smearing 04/05/2012  . Unspecified hereditary and idiopathic peripheral neuropathy 03/17/2011  . Macular degeneration (senile) of retina, unspecified 03/17/2011  . Rash and other nonspecific skin eruption 12/21/2010  . Closed fracture of intertrochanteric section of femur ([place]) 03/04/2010  . Spinal stenosis, unspecified region other than cervical 11/18/2008  . Pain in limb 11/18/2008  . Abnormality of gait 11/18/2008  . Pain in joint, pelvic region and thigh 10/2008  . Pain in joint, ankle and foot 08/07/2008  . Unspecified urinary incontinence 05/13/2008  . Corns and callosities 02/05/2008  . Vitamin D deficiency 11/07/2007  . Edema 10/25/2007  . Transient ischemic  attack (TIA), and cerebral infarction without residual deficits 10/28/2002  . Type II or unspecified type diabetes mellitus with peripheral circulatory disorders, uncontrolled(250.72) 02/12/2013  . Osteoporosis, senile    Past Surgical History  Procedure Laterality Date  . Hip fracture surgery Right 02/2010  . Cholecystectomy  1964  . Tonsillectomy and adenoidectomy      as a  child  . Breast mass excision    . Incision / drainage hand / finger  2001    cat bite  . Dilation and curettage of uterus  2003    post menopausal bleeding. Bicornate uterus/doble cervix, benign path  . Colonoscopy  12/18/2010    Dr Oletta Lamas  . Esophagogastroduodenoscopy endoscopy  12/18/2010    Dr. Oletta Lamas slight gastritis  . Esophagogastroduodenoscopy (egd) with propofol N/A 11/07/2015    Procedure: ESOPHAGOGASTRODUODENOSCOPY (EGD) WITH PROPOFOL;  Surgeon: Carol Ada, MD;  Location: [facility];  Service: Endoscopy;  Laterality: N/A;    Allergies  Allergen Reactions  . Codeine     UNKNOWN  . Tramadol     UNKNOWN  . Vesicare [Solifenacin Succinate]     UNKNOWN      Medication List       This list is accurate as of: 04/22/16 11:51 AM.  Always use your most recent med list.               acetaminophen 500 MG tablet  Commonly known as:  TYLENOL  Take 500 mg by mouth every 4 (four) hours as needed for moderate pain, fever or headache (severe pain).     atenolol 25 MG tablet  Commonly known as:  TENORMIN  Take 12.5 mg by mouth every morning.     calcium-vitamin D 500-200 MG-UNIT tablet  Commonly known as:  OSCAL WITH D  Take 1 tablet by mouth every morning.     clopidogrel 75 MG tablet  Commonly known as:  PLAVIX  Take 1 tablet (75 mg total) by mouth daily.     eucerin lotion  Apply 1 mL topically as needed for dry skin.     gabapentin 100 MG capsule  Commonly known as:  NEURONTIN  Take 100 mg by mouth 3 (three) times daily.     linagliptin 5 MG Tabs tablet  Commonly known as:  TRADJENTA  Take 5 mg by mouth daily. Every evening     loperamide 2 MG tablet  Commonly known as:  IMODIUM A-D  l tablet after each loose stools as needed for diarrhea. No more than 4 tablets a day     MAALOX ADVANCED 200-200-20 MG/5ML suspension  Generic drug:  alum & mag hydroxide-simeth  Take by mouth 2 (two) times daily as needed for indigestion or heartburn.     NAMENDA XR 28  MG Cp24 24 hr capsule  Generic drug:  memantine  Take 28 mg by mouth every morning.     Omega-3 300 MG Caps  Take by mouth daily.     pantoprazole 40 MG tablet  Commonly known as:  PROTONIX  Take 1 tablet (40 mg total) by mouth 2 (two) times daily before a meal.     Probiotic Caps  Take by mouth daily.     sertraline 25 MG tablet  Commonly known as:  ZOLOFT  Take 25 mg by mouth every morning.     spironolactone 50 MG tablet  Commonly known as:  ALDACTONE  Take 50 mg by mouth 2 (two) times daily.     vitamin B-12 1000 MCG  tablet  Commonly known as:  CYANOCOBALAMIN  Take 1,000 mcg by mouth every morning. Take one tablet daily        Review of Systems  Constitutional: Positive for appetite change and unexpected weight change. Negative for fever, chills, diaphoresis, activity change and fatigue.  Respiratory: Negative for cough, chest tightness and shortness of breath.   Cardiovascular: Positive for leg swelling. Negative for chest pain and palpitations.       Minimal edema with redness to lower bilateral extremities.  Skin: Positive for wound. Negative for color change and pallor.       See HPI  Psychiatric/Behavioral: Positive for confusion. Negative for behavioral problems and agitation.    Immunization History  Administered Date(s) Administered  . Influenza Whole 08/01/2013  . Influenza-Unspecified 08/01/2014, 07/31/2015  . PPD Test 03/09/2012  . Pneumococcal Conjugate-13 03/04/2015  . Pneumococcal Polysaccharide-23 10/18/1998  . Td 11/04/2011   Pertinent  Health Maintenance Due  Topic Date Due  . OPHTHALMOLOGY EXAM  06/21/1933  . URINE MICROALBUMIN  06[birth year]21/1935  . FOOT EXAM  09/03/2015  . INFLUENZA VACCINE  05/18/2016  . HEMOGLOBIN A1C  09/02/2016  . DEXA SCAN  Completed  . PNA vac Low Risk Adult  Completed   Fall Risk  03/10/2016 09/10/2015 09/02/2014 08/29/2013  Falls in the past year? No No No No  Risk for fall due to : - History of fall(s);Impaired  balance/gait;Impaired mobility;Mental status change - -   Functional Status Survey:    Filed Vitals:   04/22/16 1145  BP: 158/79  Pulse: 71  Temp: 97.6 F (36.4 C)  Resp: 20  Weight: 178 lb (80.74 kg)  SpO2: 95%   Body mass index is 27.87 kg/(m^2). Physical Exam  Constitutional: She is oriented to person, place, and time. No distress.  Neck: No JVD present.  Cardiovascular: Normal rate, regular rhythm and normal heart sounds.   No murmur heard. BLE edema +1  Pulmonary/Chest: Effort normal and breath sounds normal. No respiratory distress.  Abdominal: Soft. Bowel sounds are normal. She exhibits no distension.  rotund  Neurological: She is alert and oriented to person, place, and time.  Skin: Skin is warm and dry. She is not diaphoretic. There is erythema (BLE without drainage or open areas unchanged).  1X1 cm reddened-raised area to left foot tip of great toe. Blanches. Non-painful to touch.  Psychiatric: She has a normal mood and affect.  Vitals reviewed.   Labs reviewed:  Recent Labs  08/27/15 09/09/15  NA 137 138  K 4.3 5.0  BUN 10 13  CREATININE 0.8 0.8    Recent Labs  08/27/15  AST 14  ALT 12  ALKPHOS 52    Recent Labs  08/27/15 03/02/16 1108  WBC 11.1 10.6  HGB 14.2 14.1  HCT 42 46  PLT 354 352   Lab Results  Component Value Date   TSH 1.69 08/27/2015   Lab Results  Component Value Date   HGBA1C 7.7 03/02/2016   Lab Results  Component Value Date   CHOL 173 08/27/2015   HDL 38 08/27/2015   LDLCALC 102 08/27/2015   TRIG 163* 08/27/2015    Significant Diagnostic Results in last 30 days:  No results found.  Assessment/Plan 1. Type 2 diabetes mellitus with other circulatory complication, without long-term current use of insulin (HCC) Blood sugars continue to be elevated. Diet high in carbs with sedentary lifestyle. Weight down 3lb since May, however still elevated from baseline. Referred to dietary for evaluation and education regarding  new "vegetarian" diet pt is adopting.Plan to recheck A1C next month.Continue tradjenta.  Goal A1C<8%.  2. Soft tissue lesion of foot Raised area on the left great toe with erythema and tenderness with diabetes and elevated CBGs Start Doxycycline 100mg  BID X 7 days.  3. Stasis dermatitis Compression hose on in the am and off in the pm Elevate when possible  Labs/tests ordered: A1C previously ordered    Cindi Carbon, [place] [phone]

## 2016-04-23 DIAGNOSIS — H524 Presbyopia: Secondary | ICD-10-CM | POA: Diagnosis not present

## 2016-04-23 LAB — HM DIABETES EYE EXAM

## 2016-05-12 ENCOUNTER — Telehealth: Payer: Self-pay | Admitting: *Deleted

## 2016-05-12 ENCOUNTER — Ambulatory Visit: Payer: Self-pay

## 2016-05-12 NOTE — Telephone Encounter (Signed)
Left message for patient to reschedule prolia, I do not have the vaccine here at [facility] today.

## 2016-05-12 NOTE — Telephone Encounter (Signed)
appt rescheduled.

## 2016-05-19 ENCOUNTER — Ambulatory Visit: Payer: Self-pay

## 2016-05-26 ENCOUNTER — Ambulatory Visit: Payer: Medicare Other | Admitting: *Deleted

## 2016-05-26 DIAGNOSIS — M810 Age-related osteoporosis without current pathological fracture: Secondary | ICD-10-CM | POA: Diagnosis not present

## 2016-05-26 MED ORDER — DENOSUMAB 60 MG/ML ~~LOC~~ SOLN
60.0000 mg | Freq: Once | SUBCUTANEOUS | Status: AC
  Administered 2016-05-26: 60 mg via SUBCUTANEOUS

## 2016-06-02 DIAGNOSIS — E1159 Type 2 diabetes mellitus with other circulatory complications: Secondary | ICD-10-CM | POA: Diagnosis not present

## 2016-07-13 DIAGNOSIS — E0800 Diabetes mellitus due to underlying condition with hyperosmolarity without nonketotic hyperglycemic-hyperosmolar coma (NKHHC): Secondary | ICD-10-CM | POA: Diagnosis not present

## 2016-07-13 LAB — HEMOGLOBIN A1C: Hemoglobin A1C: 8.7

## 2016-07-14 ENCOUNTER — Encounter: Payer: Self-pay | Admitting: Internal Medicine

## 2016-08-11 ENCOUNTER — Encounter: Payer: Self-pay | Admitting: Internal Medicine

## 2016-08-11 ENCOUNTER — Non-Acute Institutional Stay: Payer: Medicare Other | Admitting: Internal Medicine

## 2016-08-11 VITALS — BP 140/80 | HR 66 | Temp 98.5°F | Wt 180.0 lb

## 2016-08-11 DIAGNOSIS — F02818 Alzheimer's disease with late onset: Secondary | ICD-10-CM

## 2016-08-11 DIAGNOSIS — G301 Alzheimer's disease with late onset: Secondary | ICD-10-CM | POA: Diagnosis not present

## 2016-08-11 DIAGNOSIS — E1159 Type 2 diabetes mellitus with other circulatory complications: Secondary | ICD-10-CM

## 2016-08-11 DIAGNOSIS — K580 Irritable bowel syndrome with diarrhea: Secondary | ICD-10-CM | POA: Diagnosis not present

## 2016-08-11 DIAGNOSIS — I872 Venous insufficiency (chronic) (peripheral): Secondary | ICD-10-CM | POA: Diagnosis not present

## 2016-08-11 DIAGNOSIS — F0281 Dementia in other diseases classified elsewhere with behavioral disturbance: Secondary | ICD-10-CM | POA: Diagnosis not present

## 2016-08-11 DIAGNOSIS — M810 Age-related osteoporosis without current pathological fracture: Secondary | ICD-10-CM | POA: Diagnosis not present

## 2016-08-11 NOTE — Progress Notes (Signed)
Location:  Occupational psychologist of Service:  Clinic (12)  Provider: Janari Gagner L. Mariea Clonts, D.O., C.M.D.  Code Status: DNR Goals of Care:  Advanced Directives 08/11/2016  Does patient have an advance directive? Yes  Type of Advance Directive Out of facility DNR (pink MOST or yellow form);Healthcare Power of Attorney  Does patient want to make changes to advanced directive? -  Copy of advanced directive(s) in chart? Yes  Pre-existing out of facility DNR order (yellow form or pink MOST form) Yellow form placed in chart (order not valid for inpatient use)   Chief Complaint  Patient presents with  . Medical Management of Chronic Issues    76mth follow-up    HPI: Patient is a 82 y.o. female seen today for medical management of chronic diseases.    DMII:  Has gained weight with pimento cheese and pb and j sandwiches.  180 lbs now.  Sugar average was 8.7 9/26.    Had a sore on her bottom.  It's getting better--I don't see any active treatments on her med list.  She is still uncomfortable sitting in her wheelchair.  She moved to a chair during the visit.  There are plans to get her a better chair.    Past Medical History:  Diagnosis Date  . Abnormality of gait 11/18/2008  . Anxiety   . Basilar artery syndrome   . Closed fracture of intertrochanteric section of femur ([place]) 03/04/2010  . Corns and callosities 02/05/2008  . Depressive disorder   . Diabetes mellitus   . Diarrhea 12/13/2012  . Dysphagia   . Edema 10/25/2007  . Fecal smearing 04/05/2012  . Hyperlipidemia   . Hypertension   . Insomnia   . Macular degeneration (senile) of retina, unspecified 03/17/2011  . Neuropathy ([place])   . Osteoporosis, senile   . Other atopic dermatitis and related conditions 12/13/2012  . Pain in joint, ankle and foot 08/07/2008  . Pain in joint, pelvic region and thigh 10/2008  . Pain in limb 11/18/2008  . Paroxysmal atrial tachycardia ([place])   . Peripheral vascular disease, unspecified  08/14/2012  . Plantar fascial fibromatosis   . Rash and other nonspecific skin eruption 12/21/2010  . Rickets, active   . Spinal stenosis, unspecified region other than cervical 11/18/2008  . TIA (transient ischemic attack)   . Transient ischemic attack (TIA), and cerebral infarction without residual deficits(V12.54) 10/28/2002  . Type II or unspecified type diabetes mellitus with peripheral circulatory disorders, uncontrolled(250.72) 02/12/2013  . Unspecified hereditary and idiopathic peripheral neuropathy 03/17/2011  . Unspecified urinary incontinence 05/13/2008  . Venous insufficiency   . Vitamin D deficiency 11/07/2007    Past Surgical History:  Procedure Laterality Date  . BREAST MASS EXCISION    . CHOLECYSTECTOMY  1964  . COLONOSCOPY  12/18/2010   Dr Oletta Lamas  . DILATION AND CURETTAGE OF UTERUS  2003   post menopausal bleeding. Bicornate uterus/doble cervix, benign path  . ESOPHAGOGASTRODUODENOSCOPY (EGD) WITH PROPOFOL N/A 11/07/2015   Procedure: ESOPHAGOGASTRODUODENOSCOPY (EGD) WITH PROPOFOL;  Surgeon: Carol Ada, MD;  Location: [facility];  Service: Endoscopy;  Laterality: N/A;  . ESOPHAGOGASTRODUODENOSCOPY ENDOSCOPY  12/18/2010   Dr. Oletta Lamas slight gastritis  . HIP FRACTURE SURGERY Right 02/2010  . INCISION / DRAINAGE HAND / FINGER  2001   cat bite  . TONSILLECTOMY AND ADENOIDECTOMY     as a child    Allergies  Allergen Reactions  . Codeine     UNKNOWN  . Tramadol  UNKNOWN  . Vesicare [Solifenacin Succinate]     UNKNOWN      Medication List       Accurate as of 08/11/16  3:02 PM. Always use your most recent med list.          acetaminophen 500 MG tablet Commonly known as:  TYLENOL Take 500 mg by mouth every 4 (four) hours as needed for moderate pain, fever or headache (severe pain).   atenolol 25 MG tablet Commonly known as:  TENORMIN Take 12.5 mg by mouth every morning.   calcium-vitamin D 500-200 MG-UNIT tablet Commonly known as:  OSCAL WITH D Take 1  tablet by mouth every morning.   clopidogrel 75 MG tablet Commonly known as:  PLAVIX Take 1 tablet (75 mg total) by mouth daily.   eucerin lotion Apply 1 mL topically as needed for dry skin.   gabapentin 100 MG capsule Commonly known as:  NEURONTIN Take 100 mg by mouth 3 (three) times daily.   linagliptin 5 MG Tabs tablet Commonly known as:  TRADJENTA Take 5 mg by mouth daily. Every evening   loperamide 2 MG tablet Commonly known as:  IMODIUM A-D l tablet after each loose stools as needed for diarrhea. No more than 4 tablets a day   MAALOX ADVANCED 200-200-20 MG/5ML suspension Generic drug:  alum & mag hydroxide-simeth Take by mouth 2 (two) times daily as needed for indigestion or heartburn.   NAMENDA XR 28 MG Cp24 24 hr capsule Generic drug:  memantine Take 28 mg by mouth every morning.   pantoprazole 40 MG tablet Commonly known as:  PROTONIX Take 1 tablet (40 mg total) by mouth 2 (two) times daily before a meal.   sertraline 25 MG tablet Commonly known as:  ZOLOFT Take 25 mg by mouth every morning.   spironolactone 50 MG tablet Commonly known as:  ALDACTONE Take 50 mg by mouth 2 (two) times daily.   vitamin B-12 1000 MCG tablet Commonly known as:  CYANOCOBALAMIN Take 1,000 mcg by mouth every morning. Take one tablet daily       Review of Systems:  Review of Systems  Constitutional: Negative for chills, fever and malaise/fatigue.  HENT: Positive for hearing loss.   Eyes: Negative for blurred vision.       Glasses  Respiratory: Negative for shortness of breath.   Cardiovascular: Positive for leg swelling. Negative for chest pain, palpitations and PND.       Wearing her compression hose  Gastrointestinal: Negative for abdominal pain, blood in stool, constipation, heartburn, melena, nausea and vomiting.  Genitourinary: Negative for dysuria.  Musculoskeletal: Negative for falls.  Skin: Positive for itching and rash.  Neurological: Negative for dizziness, loss  of consciousness, weakness and headaches.  Endo/Heme/Allergies: Bruises/bleeds easily.  Psychiatric/Behavioral: Positive for memory loss. Negative for depression.    Health Maintenance  Topic Date Due  . OPHTHALMOLOGY EXAM  06[birth year]1935  . URINE MICROALBUMIN  06[birth year]21/1935  . ZOSTAVAX  06/20/1985  . FOOT EXAM  09/03/2015  . HEMOGLOBIN A1C  09/02/2016  . TETANUS/TDAP  11/03/2021  . INFLUENZA VACCINE  Completed  . DEXA SCAN  Completed  . PNA vac Low Risk Adult  Completed    Physical Exam: Vitals:   08/11/16 1404  BP: 140/80  Pulse: 66  Temp: 98.5 F (36.9 C)  TempSrc: Oral  SpO2: 94%  Weight: 180 lb (81.6 kg)   Body mass index is 28.19 kg/m. Physical Exam  Constitutional: She appears well-developed and well-nourished. No distress.  Cardiovascular:  Normal rate, regular rhythm, normal heart sounds and intact distal pulses.   Pulmonary/Chest: Effort normal and breath sounds normal. No respiratory distress.  Abdominal: Soft. Bowel sounds are normal.  Musculoskeletal:  Came in wheelchair today, transferred to chair in room  Neurological: She is alert.  Oriented to person and place, not precise time, short term memory loss over course of visit  Skin: Skin is warm and dry.  Some erythema of face and patchy dry skin  Psychiatric: She has a normal mood and affect.    Labs reviewed: Basic Metabolic Panel:  Recent Labs  08/27/15 09/09/15  NA 137 138  K 4.3 5.0  BUN 10 13  CREATININE 0.8 0.8  TSH 1.69  --    Liver Function Tests:  Recent Labs  08/27/15  AST 14  ALT 12  ALKPHOS 52   No results for input(s): LIPASE, AMYLASE in the last 8760 hours. No results for input(s): AMMONIA in the last 8760 hours. CBC:  Recent Labs  08/27/15 03/02/16 1108  WBC 11.1 10.6  HGB 14.2 14.1  HCT 42 46  PLT 354 352   Lipid Panel:  Recent Labs  08/27/15  CHOL 173  HDL 38  LDLCALC 102  TRIG 163*   Lab Results  Component Value Date   HGBA1C 7.7 03/02/2016    Assessment/Plan 1. Type 2 diabetes mellitus with other circulatory complication, without long-term current use of insulin ([place]) -last hba1c was 8.7 9/26 and she's been gaining weight as she is less mobile and living off of pimento cheese sandwiches and PB and J -discussed more vegetables and fruits and less bread in her diet -she agrees to meet with Nira Conn about some dietary changes she can make to help improve glucose -she already has difficulty with loose stools at times so will avoid metformin -cont her tradjenta, but, if diet and exercise cannot improve her glucose,may need to add jardiance to her regimen next time, would like to avoid insulin also  2. Venous insufficiency -elevate feet at rest and continue compression hose, has had some fungal rash too that improved with triamcinolone  3. Irritable bowel syndrome with diarrhea -no recent c/o diarrhea and constipation  4. Late onset Alzheimer's disease with behavioral disturbance -cont namenda XR, memory is getting worse and not helped by elevated glucose recently  5. Senile osteoporosis -continues on prolia injections--last was 05/26/16 so due in February for next one  Labs/tests ordered:   Orders Placed This Encounter  Procedures  . Hemoglobin A1c    This external order was created through the Results Console.   Next appt:  12/01/2016 with labs before  [place]. Winnifred Dufford, D.O. [facility] [address], [place] [address] Cell Phone (Mon-Fri 8am-5pm):  [phone] On Call:  [phone] & follow prompts after 5pm & weekends Office Phone:  [phone] Office Fax:  [phone]

## 2016-08-18 DIAGNOSIS — L89300 Pressure ulcer of unspecified buttock, unstageable: Secondary | ICD-10-CM | POA: Diagnosis not present

## 2016-08-18 DIAGNOSIS — M4800 Spinal stenosis, site unspecified: Secondary | ICD-10-CM | POA: Diagnosis not present

## 2016-08-18 DIAGNOSIS — R278 Other lack of coordination: Secondary | ICD-10-CM | POA: Diagnosis not present

## 2016-08-18 DIAGNOSIS — E1159 Type 2 diabetes mellitus with other circulatory complications: Secondary | ICD-10-CM | POA: Diagnosis not present

## 2016-08-18 DIAGNOSIS — R269 Unspecified abnormalities of gait and mobility: Secondary | ICD-10-CM | POA: Diagnosis not present

## 2016-09-03 ENCOUNTER — Encounter: Payer: Self-pay | Admitting: Internal Medicine

## 2016-09-28 DIAGNOSIS — L89300 Pressure ulcer of unspecified buttock, unstageable: Secondary | ICD-10-CM | POA: Diagnosis not present

## 2016-09-28 DIAGNOSIS — E1159 Type 2 diabetes mellitus with other circulatory complications: Secondary | ICD-10-CM | POA: Diagnosis not present

## 2016-09-28 DIAGNOSIS — R269 Unspecified abnormalities of gait and mobility: Secondary | ICD-10-CM | POA: Diagnosis not present

## 2016-09-28 DIAGNOSIS — M4800 Spinal stenosis, site unspecified: Secondary | ICD-10-CM | POA: Diagnosis not present

## 2016-09-28 DIAGNOSIS — R278 Other lack of coordination: Secondary | ICD-10-CM | POA: Diagnosis not present

## 2016-10-13 ENCOUNTER — Encounter: Payer: Medicare Other | Admitting: Internal Medicine

## 2016-10-20 DIAGNOSIS — M4800 Spinal stenosis, site unspecified: Secondary | ICD-10-CM | POA: Diagnosis not present

## 2016-10-20 DIAGNOSIS — R269 Unspecified abnormalities of gait and mobility: Secondary | ICD-10-CM | POA: Diagnosis not present

## 2016-10-20 DIAGNOSIS — E1159 Type 2 diabetes mellitus with other circulatory complications: Secondary | ICD-10-CM | POA: Diagnosis not present

## 2016-10-20 DIAGNOSIS — L89300 Pressure ulcer of unspecified buttock, unstageable: Secondary | ICD-10-CM | POA: Diagnosis not present

## 2016-10-20 DIAGNOSIS — R278 Other lack of coordination: Secondary | ICD-10-CM | POA: Diagnosis not present

## 2016-11-08 DIAGNOSIS — E1159 Type 2 diabetes mellitus with other circulatory complications: Secondary | ICD-10-CM | POA: Diagnosis not present

## 2016-11-08 LAB — HEMOGLOBIN A1C: Hemoglobin A1C: 9.1

## 2016-11-08 LAB — BASIC METABOLIC PANEL
BUN: 20 mg/dL (ref 4–21)
Creatinine: 1.1 mg/dL (ref 0.5–1.1)
Glucose: 248 mg/dL
Potassium: 5 mmol/L (ref 3.4–5.3)
Sodium: 139 mmol/L (ref 137–147)

## 2016-11-09 ENCOUNTER — Encounter: Payer: Self-pay | Admitting: Internal Medicine

## 2016-11-09 DIAGNOSIS — Z23 Encounter for immunization: Secondary | ICD-10-CM | POA: Diagnosis not present

## 2016-11-09 DIAGNOSIS — I872 Venous insufficiency (chronic) (peripheral): Secondary | ICD-10-CM | POA: Diagnosis not present

## 2016-11-10 ENCOUNTER — Encounter: Payer: Self-pay | Admitting: Internal Medicine

## 2016-11-10 ENCOUNTER — Non-Acute Institutional Stay: Payer: Medicare Other | Admitting: Internal Medicine

## 2016-11-10 VITALS — BP 128/80 | HR 69 | Temp 97.8°F | Wt 180.0 lb

## 2016-11-10 DIAGNOSIS — M810 Age-related osteoporosis without current pathological fracture: Secondary | ICD-10-CM

## 2016-11-10 DIAGNOSIS — G301 Alzheimer's disease with late onset: Secondary | ICD-10-CM

## 2016-11-10 DIAGNOSIS — I10 Essential (primary) hypertension: Secondary | ICD-10-CM

## 2016-11-10 DIAGNOSIS — E1159 Type 2 diabetes mellitus with other circulatory complications: Secondary | ICD-10-CM

## 2016-11-10 DIAGNOSIS — K580 Irritable bowel syndrome with diarrhea: Secondary | ICD-10-CM

## 2016-11-10 DIAGNOSIS — I872 Venous insufficiency (chronic) (peripheral): Secondary | ICD-10-CM

## 2016-11-10 DIAGNOSIS — F02818 Dementia in other diseases classified elsewhere, unspecified severity, with other behavioral disturbance: Secondary | ICD-10-CM

## 2016-11-10 DIAGNOSIS — F324 Major depressive disorder, single episode, in partial remission: Secondary | ICD-10-CM | POA: Diagnosis not present

## 2016-11-10 DIAGNOSIS — F0281 Dementia in other diseases classified elsewhere with behavioral disturbance: Secondary | ICD-10-CM | POA: Diagnosis not present

## 2016-11-10 MED ORDER — DULAGLUTIDE 0.75 MG/0.5ML ~~LOC~~ SOAJ: 1.0000 | SUBCUTANEOUS | 5 refills | Status: DC

## 2016-11-10 NOTE — Progress Notes (Signed)
Location:  [facility] clinic Provider:  Thuan Tippett L. Mariea Clonts, D.O., C.M.D.  Code Status: DNR Goals of Care:  Advanced Directives 11/10/2016  Does Patient Have a Medical Advance Directive? Yes  Type of Advance Directive Out of facility DNR (pink MOST or yellow form);Living will;Healthcare Power of Attorney  Does patient want to make changes to medical advance directive? No - Patient declined  Copy of [place] in Chart? Yes  Pre-existing out of facility DNR order (yellow form or pink MOST form) Yellow form placed in chart (order not valid for inpatient use)   Chief Complaint  Patient presents with  . Medical Management of Chronic Issues    64mth follow-up    HPI: Patient is a 83 y.o. female seen today for medical management of chronic diseases.    Pt's memory loss is progressing and she is neglecting to provider herself with good personal care.  Reports she has a man who helps weekly with her finances.  She doesn't think she needs any assistance.    DMII:  hba1c has climbed to 9.1 now.  She admits to a cookie now and then and some ice cream.  Also likes the bread here.  She is not eating as much pimento cheese.    Depression:  Seems content right now.  Likes to be alone a lot in her apt and play games and read.    Venous insufficiency:  Wears her compression hose.    Past Medical History:  Diagnosis Date  . Abnormality of gait 11/18/2008  . Anxiety   . Basilar artery syndrome   . Closed fracture of intertrochanteric section of femur ([place]) 03/04/2010  . Corns and callosities 02/05/2008  . Depressive disorder   . Diabetes mellitus   . Diarrhea 12/13/2012  . Dysphagia   . Edema 10/25/2007  . Fecal smearing 04/05/2012  . Hyperlipidemia   . Hypertension   . Insomnia   . Macular degeneration (senile) of retina, unspecified 03/17/2011  . Neuropathy ([place])   . Osteoporosis, senile   . Other atopic dermatitis and related conditions 12/13/2012  . Pain in joint, ankle and foot  08/07/2008  . Pain in joint, pelvic region and thigh 10/2008  . Pain in limb 11/18/2008  . Paroxysmal atrial tachycardia ([place])   . Peripheral vascular disease, unspecified 08/14/2012  . Plantar fascial fibromatosis   . Rash and other nonspecific skin eruption 12/21/2010  . Rickets, active   . Spinal stenosis, unspecified region other than cervical 11/18/2008  . TIA (transient ischemic attack)   . Transient ischemic attack (TIA), and cerebral infarction without residual deficits(V12.54) 10/28/2002  . Type II or unspecified type diabetes mellitus with peripheral circulatory disorders, uncontrolled(250.72) 02/12/2013  . Unspecified hereditary and idiopathic peripheral neuropathy 03/17/2011  . Unspecified urinary incontinence 05/13/2008  . Venous insufficiency   . Vitamin D deficiency 11/07/2007    Past Surgical History:  Procedure Laterality Date  . BREAST MASS EXCISION    . CHOLECYSTECTOMY  1964  . COLONOSCOPY  12/18/2010   Dr Oletta Lamas  . DILATION AND CURETTAGE OF UTERUS  2003   post menopausal bleeding. Bicornate uterus/doble cervix, benign path  . ESOPHAGOGASTRODUODENOSCOPY (EGD) WITH PROPOFOL N/A 11/07/2015   Procedure: ESOPHAGOGASTRODUODENOSCOPY (EGD) WITH PROPOFOL;  Surgeon: Carol Ada, MD;  Location: [facility];  Service: Endoscopy;  Laterality: N/A;  . ESOPHAGOGASTRODUODENOSCOPY ENDOSCOPY  12/18/2010   Dr. Oletta Lamas slight gastritis  . HIP FRACTURE SURGERY Right 02/2010  . INCISION / DRAINAGE HAND / FINGER  2001  cat bite  . TONSILLECTOMY AND ADENOIDECTOMY     as a child    Allergies  Allergen Reactions  . Codeine     UNKNOWN  . Tramadol     UNKNOWN  . Vesicare [Solifenacin Succinate]     UNKNOWN    Allergies as of 11/10/2016      Reactions   Codeine    UNKNOWN   Tramadol    UNKNOWN   Vesicare [solifenacin Succinate]    UNKNOWN      Medication List       Accurate as of 11/10/16  2:21 PM. Always use your most recent med list.          acetaminophen 500 MG  tablet Commonly known as:  TYLENOL Take 500 mg by mouth every 4 (four) hours as needed for moderate pain, fever or headache (severe pain).   atenolol 25 MG tablet Commonly known as:  TENORMIN Take 12.5 mg by mouth every morning.   calcium-vitamin D 500-200 MG-UNIT tablet Commonly known as:  OSCAL WITH D Take 1 tablet by mouth every morning.   clobetasol ointment 0.05 % Commonly known as:  TEMOVATE Apply 1 application topically 2 (two) times daily.   clopidogrel 75 MG tablet Commonly known as:  PLAVIX Take 1 tablet (75 mg total) by mouth daily.   eucerin lotion Apply 1 mL topically as needed for dry skin.   gabapentin 100 MG capsule Commonly known as:  NEURONTIN Take 100 mg by mouth 3 (three) times daily.   JARDIANCE 10 MG Tabs tablet Generic drug:  empagliflozin Take 10 mg by mouth daily.   linagliptin 5 MG Tabs tablet Commonly known as:  TRADJENTA Take 5 mg by mouth daily. Every evening   loperamide 2 MG tablet Commonly known as:  IMODIUM A-D l tablet after each loose stools as needed for diarrhea. No more than 4 tablets a day   MAALOX ADVANCED 200-200-20 MG/5ML suspension Generic drug:  alum & mag hydroxide-simeth Take by mouth 2 (two) times daily as needed for indigestion or heartburn.   NAMENDA XR 28 MG Cp24 24 hr capsule Generic drug:  memantine Take 28 mg by mouth every morning.   pantoprazole 40 MG tablet Commonly known as:  PROTONIX Take 1 tablet (40 mg total) by mouth 2 (two) times daily before a meal.   saccharomyces boulardii 250 MG capsule Commonly known as:  FLORASTOR Take 250 mg by mouth 2 (two) times daily.   sertraline 25 MG tablet Commonly known as:  ZOLOFT Take 25 mg by mouth every morning.   spironolactone 50 MG tablet Commonly known as:  ALDACTONE Take 50 mg by mouth 2 (two) times daily.   vitamin B-12 1000 MCG tablet Commonly known as:  CYANOCOBALAMIN Take 1,000 mcg by mouth every morning. Take one tablet daily       Review of  Systems:  Review of Systems  Constitutional: Negative for chills, fever and malaise/fatigue.  Eyes: Negative for blurred vision.       Glasses  Respiratory: Negative for shortness of breath.   Cardiovascular: Positive for leg swelling. Negative for chest pain and palpitations.       Better with compression hose  Gastrointestinal: Positive for diarrhea. Negative for abdominal pain, blood in stool, constipation and melena.  Genitourinary: Negative for dysuria.  Musculoskeletal: Negative for falls.  Skin: Negative for itching and rash.  Neurological: Negative for dizziness, loss of consciousness and weakness.  Endo/Heme/Allergies: Bruises/bleeds easily.  Psychiatric/Behavioral: Positive for memory loss. Negative for  depression.    Health Maintenance  Topic Date Due  . OPHTHALMOLOGY EXAM  06/21/1933  . URINE MICROALBUMIN  06/21/1933  . ZOSTAVAX  06/20/1985  . FOOT EXAM  09/03/2015  . HEMOGLOBIN A1C  05/08/2017  . TETANUS/TDAP  11/03/2021  . INFLUENZA VACCINE  Completed  . DEXA SCAN  Completed  . PNA vac Low Risk Adult  Completed    Physical Exam: Vitals:   11/10/16 1410  BP: 128/80  Pulse: 69  Temp: 97.8 F (36.6 C)  TempSrc: Oral  SpO2: 95%  Weight: 180 lb (81.6 kg)   Body mass index is 28.19 kg/m. Physical Exam  Constitutional: She appears well-developed and well-nourished. No distress.  Cardiovascular: Normal rate, regular rhythm and normal heart sounds.   Pulmonary/Chest: Effort normal and breath sounds normal. No respiratory distress.  Abdominal: Soft. Bowel sounds are normal. She exhibits no distension. There is no tenderness.  Musculoskeletal: Normal range of motion.  Comes in wheelchair, ambulates with walker, wears slippers that are flip flop and falling apart, nearly fell getting on the scale  Neurological: She is alert.  Skin: Skin is warm and dry.  Psychiatric: She has a normal mood and affect.    Labs reviewed: Basic Metabolic Panel:  Recent Labs   11/08/16 0300  NA 139  K 5.0  BUN 20  CREATININE 1.1   Liver Function Tests: No results for input(s): AST, ALT, ALKPHOS, BILITOT, PROT, ALBUMIN in the last 8760 hours. No results for input(s): LIPASE, AMYLASE in the last 8760 hours. No results for input(s): AMMONIA in the last 8760 hours. CBC:  Recent Labs  03/02/16 1108  WBC 10.6  HGB 14.1  HCT 46  PLT 352   Lipid Panel: No results for input(s): CHOL, HDL, LDLCALC, TRIG, CHOLHDL, LDLDIRECT in the last 8760 hours. Lab Results  Component Value Date   HGBA1C 9.1 11/08/2016    Assessment/Plan 1. Type 2 diabetes mellitus with other circulatory complication, without long-term current use of insulin (HCC) Uncontrolled due to dietary indiscretion w/ excess ice cream, sweets, sandwiches -counseled on diet, but I doubt this will change -add trulicity weekly--if there are any problems with getting this medication, myself and or my CMA should be informed so that we can make sure this does not fall through--also, if dose is not improving sugars at next hba1c, I will increase to next dose; I'm trying to avoid putting her on insulin when she's already gaining weight and due to concerns about refusal of daily injections -hba1c even too high for a 83 yo with dementia  2. Essential hypertension, benign -bp at goal with current regimen, cont same and monitor  3. Venous insufficiency -ongoing, to be wearing compression hose but does not always agree to do this -needs more help with am routine but despite multiple attempts to encourage this, she refuses--she said I can come talk with her about cats, food, etc., but she does not want help--thinks she is doing fine though hygiene practices are starting to fail and she is wearing unsafe shoe attire (flip flop style slippers that are falling apart)  4. Irritable bowel syndrome with diarrhea -better lately for the most part  5. Late onset ALzheimer's disease with behavioral disturbance -memory  loss is worsening, continues on namenda  -would like to add aricept to it, but would go to [facility]ic due to pill burden concerns  6. Senile osteoporosis -has been getting prolia injections and also on ca with D -cont prolia x 2 yr course (last dose  was august so due in April '18, then recheck dexa after 2 yrs of therapy -remains ambulatory with walker  7. Major depressive disorder with single episode, in partial remission ([place]) -seems to me like she is doing better on the zoloft therapy for this  Next time I need to check her med list and see if all are required b/c it's extensive.  Labs/tests ordered:  Cbc, bmp, hba1c before next visit Next appt:  12/01/2016   Cyntha Brickman L. Shataya Winkles, D.O. [facility] [address], [place] [address] Cell Phone (Mon-Fri 8am-5pm):  [phone] On Call:  [phone] & follow prompts after 5pm & weekends Office Phone:  [phone] Office Fax:  [phone]

## 2016-11-18 DIAGNOSIS — L89300 Pressure ulcer of unspecified buttock, unstageable: Secondary | ICD-10-CM | POA: Diagnosis not present

## 2016-11-18 DIAGNOSIS — R278 Other lack of coordination: Secondary | ICD-10-CM | POA: Diagnosis not present

## 2016-11-18 DIAGNOSIS — M4800 Spinal stenosis, site unspecified: Secondary | ICD-10-CM | POA: Diagnosis not present

## 2016-11-18 DIAGNOSIS — E1159 Type 2 diabetes mellitus with other circulatory complications: Secondary | ICD-10-CM | POA: Diagnosis not present

## 2016-11-22 ENCOUNTER — Encounter: Payer: Self-pay | Admitting: Internal Medicine

## 2016-11-22 DIAGNOSIS — R488 Other symbolic dysfunctions: Secondary | ICD-10-CM | POA: Diagnosis not present

## 2016-11-22 DIAGNOSIS — F0391 Unspecified dementia with behavioral disturbance: Secondary | ICD-10-CM | POA: Diagnosis not present

## 2016-11-23 DIAGNOSIS — R488 Other symbolic dysfunctions: Secondary | ICD-10-CM | POA: Diagnosis not present

## 2016-11-23 DIAGNOSIS — F0391 Unspecified dementia with behavioral disturbance: Secondary | ICD-10-CM | POA: Diagnosis not present

## 2016-11-24 DIAGNOSIS — F0391 Unspecified dementia with behavioral disturbance: Secondary | ICD-10-CM | POA: Diagnosis not present

## 2016-11-24 DIAGNOSIS — R488 Other symbolic dysfunctions: Secondary | ICD-10-CM | POA: Diagnosis not present

## 2016-11-25 DIAGNOSIS — F0391 Unspecified dementia with behavioral disturbance: Secondary | ICD-10-CM | POA: Diagnosis not present

## 2016-11-25 DIAGNOSIS — R488 Other symbolic dysfunctions: Secondary | ICD-10-CM | POA: Diagnosis not present

## 2016-11-29 DIAGNOSIS — R488 Other symbolic dysfunctions: Secondary | ICD-10-CM | POA: Diagnosis not present

## 2016-11-29 DIAGNOSIS — F0391 Unspecified dementia with behavioral disturbance: Secondary | ICD-10-CM | POA: Diagnosis not present

## 2016-11-30 DIAGNOSIS — F0391 Unspecified dementia with behavioral disturbance: Secondary | ICD-10-CM | POA: Diagnosis not present

## 2016-11-30 DIAGNOSIS — R488 Other symbolic dysfunctions: Secondary | ICD-10-CM | POA: Diagnosis not present

## 2016-12-01 ENCOUNTER — Ambulatory Visit: Payer: Self-pay

## 2016-12-01 DIAGNOSIS — F0391 Unspecified dementia with behavioral disturbance: Secondary | ICD-10-CM | POA: Diagnosis not present

## 2016-12-01 DIAGNOSIS — R488 Other symbolic dysfunctions: Secondary | ICD-10-CM | POA: Diagnosis not present

## 2016-12-02 DIAGNOSIS — F0391 Unspecified dementia with behavioral disturbance: Secondary | ICD-10-CM | POA: Diagnosis not present

## 2016-12-02 DIAGNOSIS — R488 Other symbolic dysfunctions: Secondary | ICD-10-CM | POA: Diagnosis not present

## 2016-12-03 DIAGNOSIS — F0391 Unspecified dementia with behavioral disturbance: Secondary | ICD-10-CM | POA: Diagnosis not present

## 2016-12-03 DIAGNOSIS — R488 Other symbolic dysfunctions: Secondary | ICD-10-CM | POA: Diagnosis not present

## 2016-12-06 DIAGNOSIS — F0391 Unspecified dementia with behavioral disturbance: Secondary | ICD-10-CM | POA: Diagnosis not present

## 2016-12-06 DIAGNOSIS — R488 Other symbolic dysfunctions: Secondary | ICD-10-CM | POA: Diagnosis not present

## 2016-12-07 DIAGNOSIS — R488 Other symbolic dysfunctions: Secondary | ICD-10-CM | POA: Diagnosis not present

## 2016-12-07 DIAGNOSIS — F0391 Unspecified dementia with behavioral disturbance: Secondary | ICD-10-CM | POA: Diagnosis not present

## 2016-12-08 DIAGNOSIS — I872 Venous insufficiency (chronic) (peripheral): Secondary | ICD-10-CM | POA: Diagnosis not present

## 2016-12-08 DIAGNOSIS — R488 Other symbolic dysfunctions: Secondary | ICD-10-CM | POA: Diagnosis not present

## 2016-12-08 DIAGNOSIS — F0391 Unspecified dementia with behavioral disturbance: Secondary | ICD-10-CM | POA: Diagnosis not present

## 2016-12-13 DIAGNOSIS — R488 Other symbolic dysfunctions: Secondary | ICD-10-CM | POA: Diagnosis not present

## 2016-12-13 DIAGNOSIS — F0391 Unspecified dementia with behavioral disturbance: Secondary | ICD-10-CM | POA: Diagnosis not present

## 2016-12-15 DIAGNOSIS — F0391 Unspecified dementia with behavioral disturbance: Secondary | ICD-10-CM | POA: Diagnosis not present

## 2016-12-15 DIAGNOSIS — R488 Other symbolic dysfunctions: Secondary | ICD-10-CM | POA: Diagnosis not present

## 2016-12-16 DIAGNOSIS — F0391 Unspecified dementia with behavioral disturbance: Secondary | ICD-10-CM | POA: Diagnosis not present

## 2016-12-16 DIAGNOSIS — R488 Other symbolic dysfunctions: Secondary | ICD-10-CM | POA: Diagnosis not present

## 2016-12-20 DIAGNOSIS — R488 Other symbolic dysfunctions: Secondary | ICD-10-CM | POA: Diagnosis not present

## 2016-12-20 DIAGNOSIS — F0391 Unspecified dementia with behavioral disturbance: Secondary | ICD-10-CM | POA: Diagnosis not present

## 2017-01-11 DIAGNOSIS — L739 Follicular disorder, unspecified: Secondary | ICD-10-CM | POA: Diagnosis not present

## 2017-01-11 DIAGNOSIS — G609 Hereditary and idiopathic neuropathy, unspecified: Secondary | ICD-10-CM | POA: Diagnosis not present

## 2017-01-11 DIAGNOSIS — B351 Tinea unguium: Secondary | ICD-10-CM | POA: Diagnosis not present

## 2017-01-11 DIAGNOSIS — E1143 Type 2 diabetes mellitus with diabetic autonomic (poly)neuropathy: Secondary | ICD-10-CM | POA: Diagnosis not present

## 2017-01-19 ENCOUNTER — Encounter: Payer: Medicare Other | Admitting: Internal Medicine

## 2017-02-08 DIAGNOSIS — D649 Anemia, unspecified: Secondary | ICD-10-CM | POA: Diagnosis not present

## 2017-02-08 DIAGNOSIS — E119 Type 2 diabetes mellitus without complications: Secondary | ICD-10-CM | POA: Diagnosis not present

## 2017-02-08 DIAGNOSIS — E1159 Type 2 diabetes mellitus with other circulatory complications: Secondary | ICD-10-CM | POA: Diagnosis not present

## 2017-02-08 LAB — CBC AND DIFFERENTIAL
HCT: 60 % — AB (ref 36–46)
Hemoglobin: 18.5 g/dL — AB (ref 12.0–16.0)
Platelets: 375 10*3/uL (ref 150–399)
WBC: 14.9 10^3/mL

## 2017-02-08 LAB — BASIC METABOLIC PANEL
BUN: 20 mg/dL (ref 4–21)
Creatinine: 1.1 mg/dL (ref 0.5–1.1)
Glucose: 160 mg/dL
Potassium: 5 mmol/L (ref 3.4–5.3)
Sodium: 139 mmol/L (ref 137–147)

## 2017-02-08 LAB — HEMOGLOBIN A1C: Hemoglobin A1C: 7.9

## 2017-02-09 DIAGNOSIS — N390 Urinary tract infection, site not specified: Secondary | ICD-10-CM | POA: Diagnosis not present

## 2017-02-09 DIAGNOSIS — D720 Genetic anomalies of leukocytes: Secondary | ICD-10-CM | POA: Diagnosis not present

## 2017-02-09 DIAGNOSIS — R319 Hematuria, unspecified: Secondary | ICD-10-CM | POA: Diagnosis not present

## 2017-03-09 ENCOUNTER — Encounter: Payer: Medicare Other | Admitting: Internal Medicine

## 2017-03-15 ENCOUNTER — Encounter: Payer: Self-pay | Admitting: Internal Medicine

## 2017-03-15 ENCOUNTER — Non-Acute Institutional Stay (SKILLED_NURSING_FACILITY): Payer: Medicare Other | Admitting: Internal Medicine

## 2017-03-15 DIAGNOSIS — I872 Venous insufficiency (chronic) (peripheral): Secondary | ICD-10-CM

## 2017-03-15 DIAGNOSIS — F324 Major depressive disorder, single episode, in partial remission: Secondary | ICD-10-CM | POA: Diagnosis not present

## 2017-03-15 DIAGNOSIS — I10 Essential (primary) hypertension: Secondary | ICD-10-CM | POA: Diagnosis not present

## 2017-03-15 DIAGNOSIS — M810 Age-related osteoporosis without current pathological fracture: Secondary | ICD-10-CM

## 2017-03-15 DIAGNOSIS — G301 Alzheimer's disease with late onset: Secondary | ICD-10-CM | POA: Diagnosis not present

## 2017-03-15 DIAGNOSIS — F0281 Dementia in other diseases classified elsewhere with behavioral disturbance: Secondary | ICD-10-CM

## 2017-03-15 DIAGNOSIS — L409 Psoriasis, unspecified: Secondary | ICD-10-CM | POA: Diagnosis not present

## 2017-03-15 DIAGNOSIS — E1159 Type 2 diabetes mellitus with other circulatory complications: Secondary | ICD-10-CM

## 2017-03-15 DIAGNOSIS — R3981 Functional urinary incontinence: Secondary | ICD-10-CM | POA: Diagnosis not present

## 2017-03-15 DIAGNOSIS — L2089 Other atopic dermatitis: Secondary | ICD-10-CM

## 2017-03-15 DIAGNOSIS — E1143 Type 2 diabetes mellitus with diabetic autonomic (poly)neuropathy: Secondary | ICD-10-CM | POA: Diagnosis not present

## 2017-03-15 DIAGNOSIS — F02818 Dementia in other diseases classified elsewhere, unspecified severity, with other behavioral disturbance: Secondary | ICD-10-CM

## 2017-03-15 NOTE — Progress Notes (Signed)
Patient ID: Kara Harris, female   DOB: August 21, 1923, 83 y.o.   MRN: [id number]  Provider:  Rexene Edison. Mariea Clonts, D.O., C.M.D. Location:  [facility] Room Number: 694 memory care Place of Service:  SNF (31)  PCP: Gayland Curry, DO Patient Care Team: Gayland Curry, DO as PCP - General (Geriatric Medicine) Community, Well Orpah Greek, MD as Consulting Physician (Gastroenterology) Lorretta Harp, MD as Consulting Physician (Cardiology) Garvin Fila, MD as Consulting Physician (Neurology) Bjorn Loser, MD as Consulting Physician (Urology) Pedro Earls, MD as Attending Physician (Family Medicine)  Extended Emergency Contact Information Primary Emergency Contact: Allena Earing [place] of [place] Phone: [phone] Relation: Friend Secondary Emergency Contact: [facility]  [place] Phone: [phone] Relation: Other  Code Status: DNR Goals of Care: Advanced Directive information Advanced Directives 03/15/2017  Does Patient Have a Medical Advance Directive? Yes  Type of Advance Directive Out of facility DNR (pink MOST or yellow form);Living will;Healthcare Power of Attorney  Does patient want to make changes to medical advance directive? -  Copy of [place] in Chart? Yes  Pre-existing out of facility DNR order (yellow form or pink MOST form) Yellow form placed in chart (order not valid for inpatient use)      Chief Complaint  Patient presents with  . New Admit To SNF    admit to skilled room    HPI: Patient is a 83 y.o. female seen today for admission to SNF from [place].    Kara Harris has had progressive cognitive losses resulting in inability to perform self-care in [place].  She was not bathing properly resulting in cellulitis of her legs and skin breakdown of her buttocks/peri-area.  She is a bit of a loner and does not want to be involved in the social  activities.  She temporarily was brought over to memory care to see if she'd be a fit, but has not really wanted to participate in the activities.  She does have skilled care needs in terms of need for assistance with bathing,dressing, grooming, incontinence care.    She is diabetic and was making poor choices in foods (primarily eating pimento cheese sandwiches for meals and her glucose was trending upward).    Her weight had trended up 30 lbs in 2 years, also.  She is inactive.  Staff report that the wound on her right inner buttock has now healed.  She is prone to yeast infections with her DMII and incontinence so she will be continued on the mycolog cream per our wound care nurse's recommendations.  Past Medical History:  Diagnosis Date  . Abnormality of gait 11/18/2008  . Anxiety   . Basilar artery syndrome   . Closed fracture of intertrochanteric section of femur ([place]) 03/04/2010  . Corns and callosities 02/05/2008  . Depressive disorder   . Diabetes mellitus   . Diarrhea 12/13/2012  . Dysphagia   . Edema 10/25/2007  . Fecal smearing 04/05/2012  . Hyperlipidemia   . Hypertension   . Insomnia   . Macular degeneration (senile) of retina, unspecified 03/17/2011  . Neuropathy   . Osteoporosis, senile   . Other atopic dermatitis and related conditions 12/13/2012  . Pain in joint, ankle and foot 08/07/2008  . Pain in joint, pelvic region and thigh 10/2008  . Pain in limb 11/18/2008  . Paroxysmal atrial tachycardia ([place])   . Peripheral vascular disease, unspecified ([place]) 08/14/2012  . Plantar fascial  fibromatosis   . Rash and other nonspecific skin eruption 12/21/2010  . Rickets, active   . Spinal stenosis, unspecified region other than cervical 11/18/2008  . TIA (transient ischemic attack)   . Transient ischemic attack (TIA), and cerebral infarction without residual deficits(V12.54) 10/28/2002  . Type II or unspecified type diabetes mellitus with peripheral circulatory disorders,  uncontrolled(250.72) 02/12/2013  . Unspecified hereditary and idiopathic peripheral neuropathy 03/17/2011  . Unspecified urinary incontinence 05/13/2008  . Venous insufficiency   . Vitamin D deficiency 11/07/2007   Past Surgical History:  Procedure Laterality Date  . BREAST MASS EXCISION    . CHOLECYSTECTOMY  1964  . COLONOSCOPY  12/18/2010   Dr Oletta Lamas  . DILATION AND CURETTAGE OF UTERUS  2003   post menopausal bleeding. Bicornate uterus/doble cervix, benign path  . ESOPHAGOGASTRODUODENOSCOPY (EGD) WITH PROPOFOL N/A 11/07/2015   Procedure: ESOPHAGOGASTRODUODENOSCOPY (EGD) WITH PROPOFOL;  Surgeon: Carol Ada, MD;  Location: [facility];  Service: Endoscopy;  Laterality: N/A;  . ESOPHAGOGASTRODUODENOSCOPY ENDOSCOPY  12/18/2010   Dr. Oletta Lamas slight gastritis  . HIP FRACTURE SURGERY Right 02/2010  . INCISION / DRAINAGE HAND / FINGER  2001   cat bite  . TONSILLECTOMY AND ADENOIDECTOMY     as a child    reports that she has never smoked. She has never used smokeless tobacco. She reports that she does not drink alcohol or use drugs. Social History   Social History  . Marital status: Widowed    Spouse name: N/A  . Number of children: N/A  . Years of education: N/A   Occupational History  . Not on file.   Social History Main Topics  . Smoking status: Never Smoker  . Smokeless tobacco: Never Used  . Alcohol use No  . Drug use: No  . Sexual activity: No   Other Topics Concern  . Not on file   Social History Narrative   Patient is Widowed. No children. 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 Diabetes Brother   . Stroke Mother     Health Maintenance  Topic Date Due  . URINE  MICROALBUMIN  06/21/1935  . FOOT EXAM  09/03/2015  . OPHTHALMOLOGY EXAM  04/23/2017  . INFLUENZA VACCINE  05/18/2017  . HEMOGLOBIN A1C  08/10/2017  . TETANUS/TDAP  11/03/2021  . DEXA SCAN  Completed  . PNA vac Low Risk Adult  Completed    Allergies  Allergen Reactions  . Codeine     UNKNOWN  . Tramadol     UNKNOWN  . Vesicare [Solifenacin Succinate]     UNKNOWN    Outpatient Encounter Prescriptions as of 03/15/2017  Medication Sig  . atenolol (TENORMIN) 25 MG tablet Take 12.5 mg by mouth every morning.   . calcium-vitamin D (OSCAL WITH D) 500-200 MG-UNIT per tablet Take 1 tablet by mouth every morning.   . clobetasol cream (TEMOVATE) 0.05 % To bilateral legs from the Knee down every day before compression stockings applied  . clopidogrel (PLAVIX) 75 MG tablet Take 1 tablet (75 mg total) by mouth daily.  . Dulaglutide (TRULICITY) 1.77 LT/9.0ZE SOPN Inject 1 Dose into the skin once a week.  Marland Kitchen  Emollient (EUCERIN) lotion Apply 1 mL topically as needed for dry skin.  Marland Kitchen empagliflozin (JARDIANCE) 10 MG TABS tablet Take 10 mg by mouth daily.  Marland Kitchen gabapentin (NEURONTIN) 100 MG capsule Take 100 mg by mouth 3 (three) times daily.   Marland Kitchen linagliptin (TRADJENTA) 5 MG TABS tablet Take 5 mg by mouth daily. Every evening  . memantine (NAMENDA XR) 28 MG CP24 24 hr capsule Take 28 mg by mouth every morning.  . nystatin cream (MYCOSTATIN) Apply 1 application topically as needed for dry skin.  . pantoprazole (PROTONIX) 40 MG tablet Take 1 tablet (40 mg total) by mouth 2 (two) times daily before a meal.  . saccharomyces boulardii (FLORASTOR) 250 MG capsule Take 250 mg by mouth 2 (two) times daily.  . sertraline (ZOLOFT) 25 MG tablet Take 25 mg by mouth every morning.  . vitamin B-12 (CYANOCOBALAMIN) 1000 MCG tablet Take 1,000 mcg by mouth every morning. Take one tablet daily  . [DISCONTINUED] acetaminophen (TYLENOL) 500 MG tablet Take 500 mg by mouth every 4 (four) hours as needed for moderate pain, fever  or headache (severe pain).   . [DISCONTINUED] alum & mag hydroxide-simeth (MAALOX ADVANCED) 200-200-20 MG/5ML suspension Take by mouth 2 (two) times daily as needed for indigestion or heartburn.  . [DISCONTINUED] loperamide (IMODIUM A-D) 2 MG tablet l tablet after each loose stools as needed for diarrhea. No more than 4 tablets a day  . [DISCONTINUED] spironolactone (ALDACTONE) 50 MG tablet Take 50 mg by mouth 2 (two) times daily.   No facility-administered encounter medications on file as of 03/15/2017.     Review of Systems  Constitutional: Negative for activity change, appetite change, chills, fatigue and fever.  HENT: Negative for congestion, hearing loss and trouble swallowing.   Eyes: Negative for visual disturbance.       Glasses  Respiratory: Negative for chest tightness and shortness of breath.   Cardiovascular: Positive for leg swelling. Negative for chest pain and palpitations.  Gastrointestinal: Positive for diarrhea. Negative for abdominal pain, constipation, nausea and vomiting.  Genitourinary: Negative for dysuria.       Urinary incontinence (functional)  Musculoskeletal: Positive for gait problem.       Noncompliant with walker use  Allergic/Immunologic:       Diabetes  Neurological: Negative for dizziness and weakness.  Hematological: Negative for adenopathy. Does not bruise/bleed easily.  Psychiatric/Behavioral: Positive for confusion. Negative for agitation, behavioral problems and sleep disturbance.    Vitals:   03/15/17 1352  BP: 111/67  Pulse: 76  Resp: 20  Temp: 97 F (36.1 C)  TempSrc: Oral  SpO2: 90%   There is no height or weight on file to calculate BMI. Physical Exam  Constitutional: She appears well-developed and well-nourished. No distress.  HENT:  Head: Normocephalic and atraumatic.  Right Ear: External ear normal.  Left Ear: External ear normal.  Nose: Nose normal.  Mouth/Throat: Oropharynx is clear and moist. No oropharyngeal exudate.    Eyes: Conjunctivae and EOM are normal. Pupils are equal, round, and reactive to light. No scleral icterus.  glasses  Neck: Normal range of motion. Neck supple. No JVD present. No thyromegaly present.  Cardiovascular: Normal rate, regular rhythm, normal heart sounds and intact distal pulses.   Pulmonary/Chest: Effort normal and breath sounds normal. No respiratory distress.  Abdominal: Soft. Bowel sounds are normal. She exhibits no distension and no mass. There is no tenderness. There is no rebound and no guarding.  Musculoskeletal: Normal range of motion.  Walking with walker,  but reluctant when in room  Lymphadenopathy:    She has no cervical adenopathy.  Neurological: She is alert.  Oriented to person, place, not time  Skin: Skin is warm and dry.  Right buttock inner gluteal crease area healed  Psychiatric: She has a normal mood and affect.  No change to mood/affect, remains sarcastic and dry     Labs reviewed: Basic Metabolic Panel:  Recent Labs  11/08/16 02/08/17 0300  NA 139 139  K 5.0 5.0  BUN 20 20  CREATININE 1.1 1.1   Liver Function Tests: No results for input(s): AST, ALT, ALKPHOS, BILITOT, PROT, ALBUMIN in the last 8760 hours. No results for input(s): LIPASE, AMYLASE in the last 8760 hours. No results for input(s): AMMONIA in the last 8760 hours. CBC:  Recent Labs  02/08/17 0300  WBC 14.9  HGB 18.5*  HCT 60*  PLT 375   Cardiac Enzymes: No results for input(s): CKTOTAL, CKMB, CKMBINDEX, TROPONINI in the last 8760 hours. BNP: Invalid input(s): POCBNP Lab Results  Component Value Date   HGBA1C 7.9 02/08/2017   Lab Results  Component Value Date   TSH 1.69 08/27/2015    Assessment/Plan 1. Late onset Alzheimer's disease with behavioral disturbance -progressing with more functional losses MMSE - Mini Mental State Exam 09/10/2015  Orientation to time 2  Orientation to Place 5  Registration 3  Attention/ Calculation 5  Recall 3  Language- name 2  objects 2  Language- repeat 1  Language- follow 3 step command 3  Language- read & follow direction 1  Write a sentence 1  Copy design 0  Total score 26  has been declining--results not in system for some reason--need to enter next visit from [facility] social work assessments -cont namenda XR which was not started that long ago -avoid aricept due to diarrhea problems/IBS  2. Senile osteoporosis -on prolia injections, cont ca with D, should be doing more walking with walker for weightbearing exercise, but I've been encouraging this and group exercise for years to no avail  3. Essential hypertension, benign -bp well controlled, goal <130/80 if tolerated due to DMII and circulatory comps. -she is only on atenolol (might replace with ace/arb)  4. Type 2 diabetes mellitus with other circulatory complication, without long-term current use of insulin (HCC) -control has worsened for a couple of reasons in the recent past--dietary choices, inactivity and need to stop metformin due to more loose stools and fecal incontinence -now continues on jardiance, tradjenta and weekly trulicity Lab Results  Component Value Date   HGBA1C 7.9 02/08/2017  next due for hba1c in July On neurontin for neuropathy related to this On plavix for PAD Goal hba1c is <8 Trying to avoid insulin   5. Psoriasis -also noted in the past, monitor for plaques with silvery flakes  6. Flexural atopic dermatitis -noted long term, has only eucerin right now, but may do well with a low dose cortisone if these areas emerge  7. Major depressive disorder with single episode, in partial remission ([place]) -she is on low dose zoloft--I don't really see a difference in her desires for self-care--may need to consider upping dose with move if she is notably more depressed  8. Functional urinary incontinence -ongoing, one of reasons she now requires skilled care, cont briefs for this and assistance with her continence care  9. Venous  insufficiency -cont daily cleansing of legs and feet, elevate at rest and ideally wear compression hose all day and take off at hs  Family/ staff  Communication: discussed with memory care nursing staff 1st and second shift  Labs/tests ordered:  No new, due in july  Jimmye Wisnieski L. Tokiko Diefenderfer, D.O. [facility] [address], [place] [address] Cell Phone (Mon-Fri 8am-5pm):  [phone] On Call:  [phone] & follow prompts after 5pm & weekends Office Phone:  [phone] Office Fax:  [phone]

## 2017-03-16 DIAGNOSIS — E1143 Type 2 diabetes mellitus with diabetic autonomic (poly)neuropathy: Secondary | ICD-10-CM | POA: Insufficient documentation

## 2017-04-04 ENCOUNTER — Non-Acute Institutional Stay (SKILLED_NURSING_FACILITY): Payer: Medicare Other | Admitting: Adult Health

## 2017-04-04 ENCOUNTER — Encounter: Payer: Self-pay | Admitting: Adult Health

## 2017-04-04 DIAGNOSIS — B3731 Acute candidiasis of vulva and vagina: Secondary | ICD-10-CM

## 2017-04-04 DIAGNOSIS — B373 Candidiasis of vulva and vagina: Secondary | ICD-10-CM | POA: Diagnosis not present

## 2017-04-04 NOTE — Progress Notes (Signed)
Location:  Occupational psychologist of Service:  SNF (31) Provider:   Cindi Carbon, ANP [place] [phone]   Gayland Curry, DO  Patient Care Team: Gayland Curry, DO as PCP - General (Geriatric Medicine) Community, Well Orpah Greek, MD as Consulting Physician (Gastroenterology) Lorretta Harp, MD as Consulting Physician (Cardiology) Garvin Fila, MD as Consulting Physician (Neurology) Bjorn Loser, MD as Consulting Physician (Urology) Pedro Earls, MD as Attending Physician [facility] Medicine)  Extended Emergency Contact Information Primary Emergency Contact: Allena Earing [place] of [place] Phone: [phone] Relation: Friend Secondary Emergency Contact: [facility]  [place] Phone: [phone] Relation: Other  Code Status:  DNR Goals of care: Advanced Directive information Advanced Directives 03/15/2017  Does Patient Have a Medical Advance Directive? Yes  Type of Advance Directive Out of facility DNR (pink MOST or yellow form);Living will;Healthcare Power of Attorney  Does patient want to make changes to medical advance directive? -  Copy of [place] in Chart? Yes  Pre-existing out of facility DNR order (yellow form or pink MOST form) Yellow form placed in chart (order not valid for inpatient use)     Chief Complaint  Patient presents with  . Acute Visit    vaginal itching    HPI:  Pt is a 83 y.o. female seen today for an acute visit for vaginal itching for 1 week. Staff have tried nystatin cream prn but have not used it consistently to see benefit. She denies any pain but does have white discharge and intermittent itching.    Past Medical History:  Diagnosis Date  . Abnormality of gait 11/18/2008  . Anxiety   . Basilar artery syndrome   . Closed fracture of intertrochanteric section of femur ([place]) 03/04/2010  . Corns and  callosities 02/05/2008  . Depressive disorder   . Diabetes mellitus   . Diarrhea 12/13/2012  . Dysphagia   . Edema 10/25/2007  . Fecal smearing 04/05/2012  . Hyperlipidemia   . Hypertension   . Insomnia   . Macular degeneration (senile) of retina, unspecified 03/17/2011  . Neuropathy   . Osteoporosis, senile   . Other atopic dermatitis and related conditions 12/13/2012  . Pain in joint, ankle and foot 08/07/2008  . Pain in joint, pelvic region and thigh 10/2008  . Pain in limb 11/18/2008  . Paroxysmal atrial tachycardia ([place])   . Peripheral vascular disease, unspecified ([place]) 08/14/2012  . Plantar fascial fibromatosis   . Rash and other nonspecific skin eruption 12/21/2010  . Rickets, active   . Spinal stenosis, unspecified region other than cervical 11/18/2008  . TIA (transient ischemic attack)   . Transient ischemic attack (TIA), and cerebral infarction without residual deficits(V12.54) 10/28/2002  . Type II or unspecified type diabetes mellitus with peripheral circulatory disorders, uncontrolled(250.72) 02/12/2013  . Unspecified hereditary and idiopathic peripheral neuropathy 03/17/2011  . Unspecified urinary incontinence 05/13/2008  . Venous insufficiency   . Vitamin D deficiency 11/07/2007   Past Surgical History:  Procedure Laterality Date  . BREAST MASS EXCISION    . CHOLECYSTECTOMY  1964  . COLONOSCOPY  12/18/2010   Dr Oletta Lamas  . DILATION AND CURETTAGE OF UTERUS  2003   post menopausal bleeding. Bicornate uterus/doble cervix, benign path  . ESOPHAGOGASTRODUODENOSCOPY (EGD) WITH PROPOFOL N/A 11/07/2015   Procedure: ESOPHAGOGASTRODUODENOSCOPY (EGD) WITH PROPOFOL;  Surgeon: Carol Ada, MD;  [facility];  Service: Endoscopy;  Laterality: N/A;  . ESOPHAGOGASTRODUODENOSCOPY ENDOSCOPY  12/18/2010   Dr. Oletta Lamas slight gastritis  . HIP FRACTURE SURGERY Right 02/2010  . INCISION / DRAINAGE HAND / FINGER  2001   cat bite  . TONSILLECTOMY AND ADENOIDECTOMY     as a child     Allergies  Allergen Reactions  . Codeine     UNKNOWN  . Tramadol     UNKNOWN  . Vesicare [Solifenacin Succinate]     UNKNOWN    Outpatient Encounter Prescriptions as of 04/04/2017  Medication Sig  . atenolol (TENORMIN) 25 MG tablet Take 12.5 mg by mouth every morning.   . calcium-vitamin D (OSCAL WITH D) 500-200 MG-UNIT per tablet Take 1 tablet by mouth every morning.   . clobetasol cream (TEMOVATE) 0.05 % To bilateral legs from the Knee down every day before compression stockings applied  . clopidogrel (PLAVIX) 75 MG tablet Take 1 tablet (75 mg total) by mouth daily.  . Dulaglutide (TRULICITY) 0.25 [place]/7.0WC SOPN Inject 1 Dose into the skin once a week.  . Emollient (EUCERIN) lotion Apply 1 mL topically as needed for dry skin.  Marland Kitchen empagliflozin (JARDIANCE) 10 MG TABS tablet Take 10 mg by mouth daily.  Marland Kitchen gabapentin (NEURONTIN) 100 MG capsule Take 100 mg by mouth 3 (three) times daily.   Marland Kitchen linagliptin (TRADJENTA) 5 MG TABS tablet Take 5 mg by mouth daily. Every evening  . memantine (NAMENDA XR) 28 MG CP24 24 hr capsule Take 28 mg by mouth every morning.  . nystatin cream (MYCOSTATIN) Apply 1 application topically as needed for dry skin.  . pantoprazole (PROTONIX) 40 MG tablet Take 1 tablet (40 mg total) by mouth 2 (two) times daily before a meal.  . saccharomyces boulardii (FLORASTOR) 250 MG capsule Take 250 mg by mouth 2 (two) times daily.  . sertraline (ZOLOFT) 25 MG tablet Take 25 mg by mouth every morning.  . vitamin B-12 (CYANOCOBALAMIN) 1000 MCG tablet Take 1,000 mcg by mouth every morning. Take one tablet daily   No facility-administered encounter medications on file as of 04/04/2017.     Review of Systems  Endocrine: Negative for polyphagia and polyuria.  Genitourinary: Positive for vaginal discharge. Negative for difficulty urinating, dysuria, flank pain, frequency, genital sores, vaginal bleeding and vaginal pain.       Vaginal itching    Immunization History   Administered Date(s) Administered  . Influenza Inj Mdck Quad Pf 08/05/2016  . Influenza Whole 08/01/2013  . Influenza-Unspecified 08/01/2014, 07/31/2015, 08/05/2016  . PPD Test 03/09/2012  . Pneumococcal Conjugate-13 03/04/2015  . Pneumococcal Polysaccharide-23 10/18/1998  . Td 11/04/2011   Pertinent  Health Maintenance Due  Topic Date Due  . URINE MICROALBUMIN  06/21/1935  . FOOT EXAM  09/03/2015  . OPHTHALMOLOGY EXAM  04/23/2017  . INFLUENZA VACCINE  05/18/2017  . HEMOGLOBIN A1C  08/10/2017  . DEXA SCAN  Completed  . PNA vac Low Risk Adult  Completed   Fall Risk  03/10/2016 09/10/2015 09/02/2014 08/29/2013  Falls in the past year? No No No No  Risk for fall due to : - History of fall(s);Impaired balance/gait;Impaired mobility;Mental status change - -   Functional Status Survey:    There were no vitals filed for this visit. There is no height or weight on file to calculate BMI. Physical Exam  Constitutional: No distress.  Genitourinary: There is rash and tenderness on the right labia. There is no lesion or injury on the right labia. There is rash and tenderness on the left labia. There is no lesion or injury  on the left labia. There is erythema in the vagina. There are signs of injury in the vagina. Vaginal discharge (white) found.  Skin: Skin is warm and dry. She is not diaphoretic.    Labs reviewed:  Recent Labs  11/08/16 02/08/17 0300  NA 139 139  K 5.0 5.0  BUN 20 20  CREATININE 1.1 1.1   No results for input(s): AST, ALT, ALKPHOS, BILITOT, PROT, ALBUMIN in the last 8760 hours.  Recent Labs  02/08/17 0300  WBC 14.9  HGB 18.5*  HCT 60*  PLT 375   Lab Results  Component Value Date   TSH 1.69 08/27/2015   Lab Results  Component Value Date   HGBA1C 7.9 02/08/2017   Lab Results  Component Value Date   CHOL 173 08/27/2015   HDL 38 08/27/2015   LDLCALC 102 08/27/2015   TRIG 163 (A) 08/27/2015    Significant Diagnostic Results in last 30 days:  No  results found.  Assessment/Plan  1) Vaginal candidiasis  Nystatin BID for 10 days Diflucan 150 mg p.o. X 1 dose  Cindi Carbon, ANP [facility] [phone]

## 2017-04-14 ENCOUNTER — Non-Acute Institutional Stay (SKILLED_NURSING_FACILITY): Payer: Medicare Other | Admitting: Adult Health

## 2017-04-14 DIAGNOSIS — I739 Peripheral vascular disease, unspecified: Secondary | ICD-10-CM | POA: Diagnosis not present

## 2017-04-14 DIAGNOSIS — B373 Candidiasis of vulva and vagina: Secondary | ICD-10-CM | POA: Diagnosis not present

## 2017-04-14 DIAGNOSIS — E1143 Type 2 diabetes mellitus with diabetic autonomic (poly)neuropathy: Secondary | ICD-10-CM

## 2017-04-14 DIAGNOSIS — B3731 Acute candidiasis of vulva and vagina: Secondary | ICD-10-CM

## 2017-04-14 DIAGNOSIS — F0281 Dementia in other diseases classified elsewhere with behavioral disturbance: Secondary | ICD-10-CM

## 2017-04-14 DIAGNOSIS — I872 Venous insufficiency (chronic) (peripheral): Secondary | ICD-10-CM | POA: Diagnosis not present

## 2017-04-14 DIAGNOSIS — G301 Alzheimer's disease with late onset: Secondary | ICD-10-CM

## 2017-04-14 DIAGNOSIS — I10 Essential (primary) hypertension: Secondary | ICD-10-CM | POA: Diagnosis not present

## 2017-04-14 DIAGNOSIS — D72825 Bandemia: Secondary | ICD-10-CM

## 2017-04-14 LAB — HM DIABETES FOOT EXAM

## 2017-04-14 NOTE — Progress Notes (Signed)
Location:  Occupational psychologist of Service:  SNF (31) Provider:   Cindi Carbon, ANP [place] [phone]   Gayland Curry, DO  Patient Care Team: Gayland Curry, DO as PCP - General (Geriatric Medicine) Community, Well Orpah Greek, MD as Consulting Physician (Gastroenterology) Lorretta Harp, MD as Consulting Physician (Cardiology) Garvin Fila, MD as Consulting Physician (Neurology) Bjorn Loser, MD as Consulting Physician (Urology) Pedro Earls, MD as Attending Physician [facility] Medicine)  Extended Emergency Contact Information Primary Emergency Contact: Allena Earing [place] of [place] Phone: [phone] Relation: Friend Secondary Emergency Contact: [facility]  [place] Phone: [phone] Relation: Other  Code Status:  DNR Goals of care: Advanced Directive information Advanced Directives 03/15/2017  Does Patient Have a Medical Advance Directive? Yes  Type of Advance Directive Out of facility DNR (pink MOST or yellow form);Living will;Healthcare Power of Attorney  Does patient want to make changes to medical advance directive? -  Copy of [place] in Chart? Yes  Pre-existing out of facility DNR order (yellow form or pink MOST form) Yellow form placed in chart (order not valid for inpatient use)     Chief Complaint  Patient presents with  . Medical Management of Chronic Issues    HPI:  Pt is a 83 y.o. female seen today for medical management of chronic diseases.  Move to skilled care one month ago and seems to be settling in well. She remains ambulatory with a walker and continent of urine.   Memory loss: 03/01/17: MMSE 25/30, currently namenda.    Treated for vaginal yeast infection with 1 dose of diflucan and nystatin cream for 10 days, resident reports improvement  WBC 14.9 with left shift on 4/24, Hgb 18.5 In April which  usually runs on 14  Ca 10.6, no recent albumin Takes a calcium supplement  DM II CBG runs 145-156 Foot exam 04/14/2017  Needs urine micro  Stasis dermatitis: uses clobetasol to BLE daily, notes from derm indicate trying M-F Legs are chronically red but not more so than usual Wears compression hose    Lab Results  Component Value Date   HGBA1C 7.9 02/08/2017    Past Medical History:  Diagnosis Date  . Abnormality of gait 11/18/2008  . Anxiety   . Basilar artery syndrome   . Closed fracture of intertrochanteric section of femur ([place]) 03/04/2010  . Corns and callosities 02/05/2008  . Depressive disorder   . Diabetes mellitus   . Diarrhea 12/13/2012  . Dysphagia   . Edema 10/25/2007  . Fecal smearing 04/05/2012  . Hyperlipidemia   . Hypertension   . Insomnia   . Macular degeneration (senile) of retina, unspecified 03/17/2011  . Neuropathy   . Osteoporosis, senile   . Other atopic dermatitis and related conditions 12/13/2012  . Pain in joint, ankle and foot 08/07/2008  . Pain in joint, pelvic region and thigh 10/2008  . Pain in limb 11/18/2008  . Paroxysmal atrial tachycardia ([place])   . Peripheral vascular disease, unspecified ([place]) 08/14/2012  . Plantar fascial fibromatosis   . Rash and other nonspecific skin eruption 12/21/2010  . Rickets, active   . Spinal stenosis, unspecified region other than cervical 11/18/2008  . TIA (transient ischemic attack)   . Transient ischemic attack (TIA), and cerebral infarction without residual deficits(V12.54) 10/28/2002  . Type II or unspecified type diabetes mellitus with peripheral circulatory disorders, uncontrolled(250.72) 02/12/2013  . Unspecified hereditary and idiopathic  peripheral neuropathy 03/17/2011  . Unspecified urinary incontinence 05/13/2008  . Venous insufficiency   . Vitamin D deficiency 11/07/2007   Past Surgical History:  Procedure Laterality Date  . BREAST MASS EXCISION    . CHOLECYSTECTOMY  1964  . COLONOSCOPY  12/18/2010   Dr  Oletta Lamas  . DILATION AND CURETTAGE OF UTERUS  2003   post menopausal bleeding. Bicornate uterus/doble cervix, benign path  . ESOPHAGOGASTRODUODENOSCOPY (EGD) WITH PROPOFOL N/A 11/07/2015   Procedure: ESOPHAGOGASTRODUODENOSCOPY (EGD) WITH PROPOFOL;  Surgeon: Carol Ada, MD;  Location: [facility]COPY;  Service: Endoscopy;  Laterality: N/A;  . ESOPHAGOGASTRODUODENOSCOPY ENDOSCOPY  12/18/2010   Dr. Oletta Lamas slight gastritis  . HIP FRACTURE SURGERY Right 02/2010  . INCISION / DRAINAGE HAND / FINGER  2001   cat bite  . TONSILLECTOMY AND ADENOIDECTOMY     as a child    Allergies  Allergen Reactions  . Codeine     UNKNOWN  . Tramadol     UNKNOWN  . Vesicare [Solifenacin Succinate]     UNKNOWN    Outpatient Encounter Prescriptions as of 04/14/2017  Medication Sig  . atenolol (TENORMIN) 25 MG tablet Take 12.5 mg by mouth every morning.   . calcium-vitamin D (OSCAL WITH D) 500-200 MG-UNIT per tablet Take 1 tablet by mouth every morning.   . clobetasol cream (TEMOVATE) 0.05 % To bilateral legs from the Knee down every day before compression stockings applied  . clopidogrel (PLAVIX) 75 MG tablet Take 1 tablet (75 mg total) by mouth daily.  . Dulaglutide (TRULICITY) 9.62 IW/9.7LG SOPN Inject 1 Dose into the skin once a week.  . Emollient (EUCERIN) lotion Apply 1 mL topically as needed for dry skin.  Marland Kitchen empagliflozin (JARDIANCE) 10 MG TABS tablet Take 10 mg by mouth daily.  Marland Kitchen gabapentin (NEURONTIN) 100 MG capsule Take 100 mg by mouth 3 (three) times daily.   Marland Kitchen linagliptin (TRADJENTA) 5 MG TABS tablet Take 5 mg by mouth daily. Every evening  . memantine (NAMENDA) 10 MG tablet Take 10 mg by mouth 2 (two) times daily.  Marland Kitchen nystatin cream (MYCOSTATIN) Apply 1 application topically 2 (two) times daily.   . pantoprazole (PROTONIX) 40 MG tablet Take 1 tablet (40 mg total) by mouth 2 (two) times daily before a meal.  . saccharomyces boulardii (FLORASTOR) 250 MG capsule Take 250 mg by mouth 2 (two) times daily.    . sertraline (ZOLOFT) 50 MG tablet Take 50 mg by mouth daily.  Marland Kitchen spironolactone (ALDACTONE) 50 MG tablet Take 50 mg by mouth 2 (two) times daily.  . vitamin B-12 (CYANOCOBALAMIN) 1000 MCG tablet Take 1,000 mcg by mouth every morning. Take one tablet daily  . [DISCONTINUED] memantine (NAMENDA XR) 28 MG CP24 24 hr capsule Take 28 mg by mouth every morning.  . [DISCONTINUED] sertraline (ZOLOFT) 25 MG tablet Take 25 mg by mouth every morning.   No facility-administered encounter medications on file as of 04/14/2017.     Review of Systems  Constitutional: Negative for activity change, appetite change, chills, diaphoresis, fatigue, fever and unexpected weight change.  HENT: Negative for congestion, rhinorrhea and sinus pressure.   Respiratory: Negative for cough, shortness of breath and wheezing.   Cardiovascular: Negative for chest pain, palpitations and leg swelling.  Gastrointestinal: Negative for abdominal distention, abdominal pain, constipation and diarrhea.  Genitourinary: Negative for difficulty urinating and dysuria.  Musculoskeletal: Positive for arthralgias and gait problem. Negative for back pain, joint swelling and myalgias.  Skin: Positive for rash.  Neurological: Negative for dizziness,  tremors, seizures, syncope, facial asymmetry, speech difficulty, weakness, light-headedness, numbness and headaches.  Psychiatric/Behavioral: Negative for agitation, behavioral problems and confusion.       Memory loss    Immunization History  Administered Date(s) Administered  . Influenza Inj Mdck Quad Pf 08/05/2016  . Influenza Whole 08/01/2013  . Influenza-Unspecified 08/01/2014, 07/31/2015, 08/05/2016  . PPD Test 03/09/2012  . Pneumococcal Conjugate-13 03/04/2015  . Pneumococcal Polysaccharide-23 10/18/1998  . Td 11/04/2011   Pertinent  Health Maintenance Due  Topic Date Due  . URINE MICROALBUMIN  06/21/1933  . FOOT EXAM  09/03/2015  . OPHTHALMOLOGY EXAM  04/23/2017  . INFLUENZA  VACCINE  05/18/2017  . HEMOGLOBIN A1C  08/10/2017  . DEXA SCAN  Completed  . PNA vac Low Risk Adult  Completed   Fall Risk  03/10/2016 09/10/2015 09/02/2014 08/29/2013  Falls in the past year? No No No No  Risk for fall due to : - History of fall(s);Impaired balance/gait;Impaired mobility;Mental status change - -   Functional Status Survey:    Vitals:   04/14/17 1130  BP: 105/64  Pulse: (!) 54  Resp: 18  Temp: 97.1 F (36.2 C)  SpO2: 90%  Weight: 163 lb 6.4 oz (74.1 kg)   Body mass index is 25.59 kg/m. Physical Exam  Constitutional: No distress.  HENT:  Head: Normocephalic and atraumatic.  Neck: No JVD present. No thyromegaly present.  Cardiovascular: Normal rate and regular rhythm.   Murmur heard. BLE no edema but chronic erythema noted.  Pulmonary/Chest: Effort normal and breath sounds normal. No respiratory distress.  Abdominal: Soft. Bowel sounds are normal.  rotund  Genitourinary: There is rash on the right labia. There is no tenderness, lesion or injury on the right labia. There is rash on the left labia. There is no tenderness, lesion or injury on the left labia. There is erythema in the vagina. No signs of injury around the vagina. No vaginal discharge found.  Musculoskeletal: She exhibits no edema or tenderness.  kyphosis  Lymphadenopathy:    She has no cervical adenopathy.  Neurological: She is alert.  Oriented to self, place, situation, not time  Skin: Skin is warm and dry. She is not diaphoretic.  Erythema that blanches to both buttocks, worse on left  Psychiatric: She has a normal mood and affect.  Nursing note and vitals reviewed.   Labs reviewed:  Recent Labs  11/08/16 02/08/17 0300  NA 139 139  K 5.0 5.0  BUN 20 20  CREATININE 1.1 1.1   No results for input(s): AST, ALT, ALKPHOS, BILITOT, PROT, ALBUMIN in the last 8760 hours.  Recent Labs  02/08/17 0300  WBC 14.9  HGB 18.5*  HCT 60*  PLT 375   Lab Results  Component Value Date   TSH  1.69 08/27/2015   Lab Results  Component Value Date   HGBA1C 7.9 02/08/2017   Lab Results  Component Value Date   CHOL 173 08/27/2015   HDL 38 08/27/2015   LDLCALC 102 08/27/2015   TRIG 163 (A) 08/27/2015    Significant Diagnostic Results in last 30 days:  No results found.  Assessment/Plan  1. Controlled type 2 diabetes mellitus with diabetic autonomic neuropathy, without long-term current use of insulin (HCC) Continue current regimen and check A1C Needs eye exam and urine micro No reports of pain, continue neurontin 100 mg tid  2. Vaginal candida Continue Nystatin cream BID to vagina for 7 days Barrier cream to buttocks BID, avoid towel over cushion  3. Venous stasis dermatitis  of both lower extremities Clobetasol cream to BLE Monday thur Friday, continue compression  4. Peripheral vascular disease, unspecified No current issues with pain or ulcerations  5. Essential hypertension, benign Controlled Continue aldactone 50 mg BID< monitor BMP closely and BP  6. Late onset Alzheimer's disease with behavioral disturbance Continue Namenda 10 mg BID  7. Bandemia Noted in March with elevated HGB as well, will recheck CBC Also elevated calcium, will recheck with CMP to eval with albumin level as well   Family/ staff Communication: discussed with staff  Labs/tests ordered:  CBC CMP A1C urine micro

## 2017-04-18 DIAGNOSIS — E1159 Type 2 diabetes mellitus with other circulatory complications: Secondary | ICD-10-CM | POA: Diagnosis not present

## 2017-04-18 DIAGNOSIS — E860 Dehydration: Secondary | ICD-10-CM | POA: Diagnosis not present

## 2017-04-18 DIAGNOSIS — D72820 Lymphocytosis (symptomatic): Secondary | ICD-10-CM | POA: Diagnosis not present

## 2017-04-18 LAB — CBC AND DIFFERENTIAL
HCT: 57 — AB (ref 36–46)
Hemoglobin: 17.8 — AB (ref 12.0–16.0)
PLATELETS: 375 (ref 150–399)
WBC: 12.5

## 2017-04-18 LAB — HEPATIC FUNCTION PANEL
ALT: 11 (ref 7–35)
AST: 15 (ref 13–35)
Alkaline Phosphatase: 90 (ref 25–125)
Bilirubin, Total: 0.6

## 2017-04-18 LAB — BASIC METABOLIC PANEL
BUN: 22 — AB (ref 4–21)
CREATININE: 1.3 — AB (ref 0.5–1.1)
Glucose: 153
POTASSIUM: 5.2 (ref 3.4–5.3)
Sodium: 135 — AB (ref 137–147)

## 2017-04-18 LAB — MICROALBUMIN, URINE: Microalb, Ur: <1.2

## 2017-04-18 LAB — HEMOGLOBIN A1C: Hemoglobin A1C: 7.5

## 2017-05-09 ENCOUNTER — Non-Acute Institutional Stay (SKILLED_NURSING_FACILITY): Payer: Medicare Other | Admitting: Adult Health

## 2017-05-09 ENCOUNTER — Encounter: Payer: Self-pay | Admitting: Adult Health

## 2017-05-09 DIAGNOSIS — F0281 Dementia in other diseases classified elsewhere with behavioral disturbance: Secondary | ICD-10-CM | POA: Diagnosis not present

## 2017-05-09 DIAGNOSIS — N183 Chronic kidney disease, stage 3 unspecified: Secondary | ICD-10-CM

## 2017-05-09 DIAGNOSIS — M810 Age-related osteoporosis without current pathological fracture: Secondary | ICD-10-CM

## 2017-05-09 DIAGNOSIS — E1143 Type 2 diabetes mellitus with diabetic autonomic (poly)neuropathy: Secondary | ICD-10-CM | POA: Diagnosis not present

## 2017-05-09 DIAGNOSIS — I10 Essential (primary) hypertension: Secondary | ICD-10-CM

## 2017-05-09 DIAGNOSIS — G301 Alzheimer's disease with late onset: Secondary | ICD-10-CM | POA: Diagnosis not present

## 2017-05-09 DIAGNOSIS — S30810A Abrasion of lower back and pelvis, initial encounter: Secondary | ICD-10-CM

## 2017-05-09 DIAGNOSIS — D72825 Bandemia: Secondary | ICD-10-CM | POA: Diagnosis not present

## 2017-05-09 DIAGNOSIS — I872 Venous insufficiency (chronic) (peripheral): Secondary | ICD-10-CM

## 2017-05-09 NOTE — Progress Notes (Signed)
Location:  Occupational psychologist of Service:  SNF (31) Provider:   Cindi Carbon, ANP [place] [phone]   Gayland Curry, DO  Patient Care Team: Gayland Curry, DO as PCP - General (Geriatric Medicine) Community, Well Orpah Greek, MD as Consulting Physician (Gastroenterology) Lorretta Harp, MD as Consulting Physician (Cardiology) Garvin Fila, MD as Consulting Physician (Neurology) Bjorn Loser, MD as Consulting Physician (Urology) Pedro Earls, MD as Attending Physician [facility] Medicine)  Extended Emergency Contact Information Primary Emergency Contact: Allena Earing [place] of [place] Phone: [phone] Relation: Friend Secondary Emergency Contact: [facility]  [place] Phone: [phone] Relation: Other  Code Status:  DNR Goals of care: Advanced Directive information Advanced Directives 05/09/2017  Does Patient Have a Medical Advance Directive? Yes  Type of Advance Directive Out of facility DNR (pink MOST or yellow form);Living will;Healthcare Power of Attorney  Does patient want to make changes to medical advance directive? -  Copy of [place] in Chart? Yes  Pre-existing out of facility DNR order (yellow form or pink MOST form) Yellow form placed in chart (order not valid for inpatient use)     Chief Complaint  Patient presents with  . Medical Management of Chronic Issues    HPI:  Pt is a 83 y.o. female seen today for medical management of chronic diseases.  Move to skilled care one month ago and seems to be settling in well.   Memory loss: 03/01/17: MMSE 25/30, currently namenda.    Lab Results  Component Value Date   WBC 12.5 04/18/2017   Lab Results  Component Value Date   HGB 17.8 (A) 04/18/2017    DM II CBG runs 108-156, improved A1C Lab Results  Component Value Date   HGBA1C 7.5 04/18/2017   Annual exam  pending Urine micro <1.2 on 04/18/17  Stasis dermatitis: uses clobetasol to BLE daily, notes from derm indicate trying M-F Legs are chronically red but not more so than usual Wears compression hose  CKD III 35.7 ml/min  Lab Results  Component Value Date   CREATININE 1.3 (A) 04/18/2017   Functional status: ambulatory with a walker, continent/incontinent, continent of stool  Past Medical History:  Diagnosis Date  . Abnormality of gait 11/18/2008  . Anxiety   . Basilar artery syndrome   . Closed fracture of intertrochanteric section of femur ([place]) 03/04/2010  . Corns and callosities 02/05/2008  . Depressive disorder   . Diabetes mellitus   . Diarrhea 12/13/2012  . Dysphagia   . Edema 10/25/2007  . Fecal smearing 04/05/2012  . Hyperlipidemia   . Hypertension   . Insomnia   . Macular degeneration (senile) of retina, unspecified 03/17/2011  . Neuropathy   . Osteoporosis, senile   . Other atopic dermatitis and related conditions 12/13/2012  . Pain in joint, ankle and foot 08/07/2008  . Pain in joint, pelvic region and thigh 10/2008  . Pain in limb 11/18/2008  . Paroxysmal atrial tachycardia ([place])   . Peripheral vascular disease, unspecified ([place]) 08/14/2012  . Plantar fascial fibromatosis   . Rash and other nonspecific skin eruption 12/21/2010  . Rickets, active   . Spinal stenosis, unspecified region other than cervical 11/18/2008  . TIA (transient ischemic attack)   . Transient ischemic attack (TIA), and cerebral infarction without residual deficits(V12.54) 10/28/2002  . Type II or unspecified type diabetes mellitus with peripheral circulatory disorders, uncontrolled(250.72) 02/12/2013  . Unspecified hereditary and  idiopathic peripheral neuropathy 03/17/2011  . Unspecified urinary incontinence 05/13/2008  . Venous insufficiency   . Vitamin D deficiency 11/07/2007   Past Surgical History:  Procedure Laterality Date  . BREAST MASS EXCISION    . CHOLECYSTECTOMY  1964  . COLONOSCOPY  12/18/2010     Dr Oletta Lamas  . DILATION AND CURETTAGE OF UTERUS  2003   post menopausal bleeding. Bicornate uterus/doble cervix, benign path  . ESOPHAGOGASTRODUODENOSCOPY (EGD) WITH PROPOFOL N/A 11/07/2015   Procedure: ESOPHAGOGASTRODUODENOSCOPY (EGD) WITH PROPOFOL;  Surgeon: Carol Ada, MD;  Location[facility];  Service: Endoscopy;  Laterality: N/A;  . ESOPHAGOGASTRODUODENOSCOPY ENDOSCOPY  12/18/2010   Dr. Oletta Lamas slight gastritis  . HIP FRACTURE SURGERY Right 02/2010  . INCISION / DRAINAGE HAND / FINGER  2001   cat bite  . TONSILLECTOMY AND ADENOIDECTOMY     as a child    Allergies  Allergen Reactions  . Codeine     UNKNOWN  . Tramadol     UNKNOWN  . Vesicare [Solifenacin Succinate]     UNKNOWN    Outpatient Encounter Prescriptions as of 05/09/2017  Medication Sig  . Liraglutide (VICTOZA [place]) Inject 0.6 mg into the skin daily. 0.6 mg qd for 1 week then 1.2 mg qd  . atenolol (TENORMIN) 25 MG tablet Take 12.5 mg by mouth every morning.   . calcium-vitamin D (OSCAL WITH D) 500-200 MG-UNIT per tablet Take 1 tablet by mouth every morning.   . clobetasol cream (TEMOVATE) 0.05 % To bilateral legs from the Knee down every day before compression stockings applied Monday thur Friday  . clopidogrel (PLAVIX) 75 MG tablet Take 1 tablet (75 mg total) by mouth daily.  . Emollient (EUCERIN) lotion Apply 1 mL topically as needed for dry skin.  Marland Kitchen empagliflozin (JARDIANCE) 10 MG TABS tablet Take 10 mg by mouth daily.  Marland Kitchen gabapentin (NEURONTIN) 100 MG capsule Take 100 mg by mouth 3 (three) times daily.   Marland Kitchen linagliptin (TRADJENTA) 5 MG TABS tablet Take 5 mg by mouth daily. Every evening  . memantine (NAMENDA) 10 MG tablet Take 10 mg by mouth 2 (two) times daily.  Marland Kitchen nystatin cream (MYCOSTATIN) Apply 1 application topically 2 (two) times daily.   . pantoprazole (PROTONIX) 40 MG tablet Take 1 tablet (40 mg total) by mouth 2 (two) times daily before a meal.  . saccharomyces boulardii (FLORASTOR) 250 MG capsule Take  250 mg by mouth 2 (two) times daily.  . sertraline (ZOLOFT) 50 MG tablet Take 50 mg by mouth daily.  Marland Kitchen spironolactone (ALDACTONE) 50 MG tablet Take 50 mg by mouth daily.   . vitamin B-12 (CYANOCOBALAMIN) 1000 MCG tablet Take 1,000 mcg by mouth every morning. Take one tablet daily  . [DISCONTINUED] Dulaglutide (TRULICITY) 5.28 UX/3.2GM SOPN Inject 1 Dose into the skin once a week.   No facility-administered encounter medications on file as of 05/09/2017.     Review of Systems  Constitutional: Negative for chills, diaphoresis, fever, malaise/fatigue and weight loss.  Respiratory: Negative for cough, hemoptysis, sputum production and wheezing.   Cardiovascular: Positive for leg swelling. Negative for chest pain and PND.  Gastrointestinal: Negative for abdominal pain, constipation, diarrhea, nausea and vomiting.  Genitourinary: Negative for dysuria and urgency.  Musculoskeletal: Positive for joint pain. Negative for back pain, falls, myalgias and neck pain.  Skin: Positive for rash (chronic dermatitis). Negative for itching.  Neurological: Negative for tingling, tremors and weakness.  Psychiatric/Behavioral: Positive for memory loss. Negative for depression. The patient does not have insomnia.  Immunization History  Administered Date(s) Administered  . Influenza Inj Mdck Quad Pf 08/05/2016  . Influenza Whole 08/01/2013  . Influenza-Unspecified 08/01/2014, 07/31/2015, 08/05/2016  . PPD Test 03/09/2012  . Pneumococcal Conjugate-13 03/04/2015  . Pneumococcal Polysaccharide-23 10/18/1998  . Td 11/04/2011   Pertinent  Health Maintenance Due  Topic Date Due  . OPHTHALMOLOGY EXAM  04/23/2017  . INFLUENZA VACCINE  05/18/2017  . HEMOGLOBIN A1C  10/19/2017  . FOOT EXAM  04/14/2018  . URINE MICROALBUMIN  04/18/2018  . DEXA SCAN  Completed  . PNA vac Low Risk Adult  Completed   Fall Risk  03/10/2016 09/10/2015 09/02/2014 08/29/2013  Falls in the past year? No No No No  Risk for fall due  to : - History of fall(s);Impaired balance/gait;Impaired mobility;Mental status change - -   Functional Status Survey:    Vitals:   05/09/17 1357  BP: 104/65  Pulse: 62  Resp: 18  Temp: 97.6 F (36.4 C)  SpO2: 91%  Weight: 158 lb 1.6 oz (71.7 kg)   Body mass index is 24.76 kg/m.  Wt Readings from Last 3 Encounters:  05/09/17 158 lb 1.6 oz (71.7 kg)  04/14/17 163 lb 6.4 oz (74.1 kg)  11/10/16 180 lb (81.6 kg)   Physical Exam  Constitutional: No distress.  HENT:  Head: Normocephalic and atraumatic.  Neck: No JVD present. No thyromegaly present.  Cardiovascular: Normal rate and regular rhythm.   Murmur heard. BLE no edema but chronic erythema noted.  Pulmonary/Chest: Effort normal and breath sounds normal. No respiratory distress.  Abdominal: Soft. Bowel sounds are normal.  rotund  Musculoskeletal: She exhibits no edema or tenderness.  kyphosis  Lymphadenopathy:    She has no cervical adenopathy.  Neurological: She is alert.  Oriented to self, place, situation, not time  Skin: Skin is warm and dry. She is not diaphoretic.  Erythema that blanches to both buttocks without open areas, dry scaly skin Psychiatric: She has a normal mood and affect.  Nursing note and vitals reviewed.   Labs reviewed:  Recent Labs  11/08/16 02/08/17 0300 04/18/17  NA 139 139 135*  K 5.0 5.0 5.2  BUN 20 20 22*  CREATININE 1.1 1.1 1.3*    Recent Labs  04/18/17  AST 15  ALT 11  ALKPHOS 90    Recent Labs  02/08/17 0300 04/18/17  WBC 14.9 12.5  HGB 18.5* 17.8*  HCT 60* 57*  PLT 375 375   Lab Results  Component Value Date   TSH 1.69 08/27/2015   Lab Results  Component Value Date   HGBA1C 7.5 04/18/2017   Lab Results  Component Value Date   CHOL 173 08/27/2015   HDL 38 08/27/2015   LDLCALC 102 08/27/2015   TRIG 163 (A) 08/27/2015    Significant Diagnostic Results in last 30 days:  No results found.  Assessment/Plan  Controlled type 2 diabetes mellitus with  diabetic autonomic neuropathy (HCC) Improved A1C, continue current medication. Monitor renal function periodically.  Eye exam pending.  Venous insufficiency Unchanged with chronic erythema. Continue clobetasol Monday-Friday per derm  Dementia with behavioral disturbance Progressive cognitive losses but remains ambulatory and verbal. Continue namenda.   Essential hypertension, benign Controlled even with reduced dose of aldactone due to elevated K. Continue aldactone 50 mg qd and atenolol 25 mg qd  CKD (chronic kidney disease), stage III Continue to periodically monitor BMP and avoid nephrotoxic agents   Leukocytosis Continue to monitor, trending down from 14.9 to 12.5.  Hgb also trending down  from 18.5 to 17.8.    Unspecified urinary incontinence Has chronic erythema to buttocks and needs to wear barrier cream BID.  Keep clean and dry. Avoid towel on cushion.  Senile osteoporosis Due for Dexa scan. Should have received Prolia in Feb but I believe there were ins issues. Will research this with staff. Continues on Calcium with Vit D. Last Ca returned to normal 9.9.  Excoriation of buttocks: Should wear barrier cream BID and keep towel off gel cushion (inhibits efficacy)  Family/ staff Communication: discussed with staff  Labs/tests ordered:  DEXA scan due

## 2017-05-12 DIAGNOSIS — N183 Chronic kidney disease, stage 3 unspecified: Secondary | ICD-10-CM | POA: Insufficient documentation

## 2017-05-12 DIAGNOSIS — N184 Chronic kidney disease, stage 4 (severe): Secondary | ICD-10-CM | POA: Insufficient documentation

## 2017-05-12 NOTE — Assessment & Plan Note (Signed)
Unchanged with chronic erythema. Continue clobetasol Monday-Friday per derm

## 2017-05-12 NOTE — Assessment & Plan Note (Signed)
Improved A1C, continue current medication. Monitor renal function periodically.  Eye exam pending.

## 2017-05-12 NOTE — Assessment & Plan Note (Signed)
Due for Dexa scan. Should have received Prolia in Feb but I believe there were ins issues. Will research this with staff. Continues on Calcium with Vit D. Last Ca returned to normal 9.9.

## 2017-05-12 NOTE — Assessment & Plan Note (Signed)
Continue to periodically monitor BMP and avoid nephrotoxic agents

## 2017-05-12 NOTE — Assessment & Plan Note (Signed)
Progressive cognitive losses but remains ambulatory and verbal. Continue namenda.

## 2017-05-12 NOTE — Assessment & Plan Note (Signed)
Has chronic erythema to buttocks and needs to wear barrier cream BID.  Keep clean and dry. Avoid towel on cushion.

## 2017-05-12 NOTE — Assessment & Plan Note (Addendum)
Continue to monitor, trending down from 14.9 to 12.5.  Hgb also trending down from 18.5 to 17.8.

## 2017-05-12 NOTE — Assessment & Plan Note (Signed)
Controlled even with reduced dose of aldactone due to elevated K. Continue aldactone 50 mg qd and atenolol 25 mg qd

## 2017-05-13 ENCOUNTER — Other Ambulatory Visit: Payer: Self-pay | Admitting: Internal Medicine

## 2017-05-13 DIAGNOSIS — M810 Age-related osteoporosis without current pathological fracture: Secondary | ICD-10-CM

## 2017-05-16 DIAGNOSIS — D649 Anemia, unspecified: Secondary | ICD-10-CM | POA: Diagnosis not present

## 2017-05-16 DIAGNOSIS — D72829 Elevated white blood cell count, unspecified: Secondary | ICD-10-CM | POA: Diagnosis not present

## 2017-05-17 DIAGNOSIS — D485 Neoplasm of uncertain behavior of skin: Secondary | ICD-10-CM | POA: Diagnosis not present

## 2017-05-17 DIAGNOSIS — L570 Actinic keratosis: Secondary | ICD-10-CM | POA: Diagnosis not present

## 2017-05-17 DIAGNOSIS — L821 Other seborrheic keratosis: Secondary | ICD-10-CM | POA: Diagnosis not present

## 2017-05-17 DIAGNOSIS — D0439 Carcinoma in situ of skin of other parts of face: Secondary | ICD-10-CM | POA: Diagnosis not present

## 2017-05-18 ENCOUNTER — Telehealth: Payer: Self-pay | Admitting: *Deleted

## 2017-05-18 DIAGNOSIS — D72829 Elevated white blood cell count, unspecified: Secondary | ICD-10-CM | POA: Diagnosis not present

## 2017-05-18 NOTE — Telephone Encounter (Signed)
A[place] with [facility] called and stated that they received 3 unstained blood smears for pathology review and they are wanting to know the Account # and requesting last CBC report.  This is a Community education officer Skilled patient, gave him the number to [employer]

## 2017-05-24 ENCOUNTER — Ambulatory Visit
Admission: RE | Admit: 2017-05-24 | Discharge: 2017-05-24 | Disposition: A | Discharge status: Home / Self Care | Payer: Medicare Other | Source: Ambulatory Visit | Attending: Internal Medicine | Admitting: Internal Medicine

## 2017-05-24 DIAGNOSIS — M810 Age-related osteoporosis without current pathological fracture: Secondary | ICD-10-CM

## 2017-05-30 ENCOUNTER — Non-Acute Institutional Stay (SKILLED_NURSING_FACILITY): Payer: Medicare Other | Admitting: Adult Health

## 2017-05-30 DIAGNOSIS — L03119 Cellulitis of unspecified part of limb: Secondary | ICD-10-CM

## 2017-05-30 DIAGNOSIS — E1143 Type 2 diabetes mellitus with diabetic autonomic (poly)neuropathy: Secondary | ICD-10-CM | POA: Diagnosis not present

## 2017-05-30 DIAGNOSIS — I872 Venous insufficiency (chronic) (peripheral): Secondary | ICD-10-CM | POA: Diagnosis not present

## 2017-05-30 NOTE — Progress Notes (Signed)
Location:  Occupational psychologist of Service:  SNF (31) Provider:   Cindi Carbon, ANP [place] [phone]   Gayland Curry, DO  Patient Care Team: Gayland Curry, DO as PCP - General (Geriatric Medicine) Community, [facility] Orpah Greek, MD as Consulting Physician (Gastroenterology) Lorretta Harp, MD as Consulting Physician (Cardiology) Garvin Fila, MD as Consulting Physician (Neurology) Bjorn Loser, MD as Consulting Physician (Urology) Pedro Earls, MD as Attending Physician [facility])  Extended Emergency Contact Information Primary Emergency Contact: Allena Earing [place] of [place] Phone: [phone] Relation: Friend Secondary Emergency Contact: [facility]  [place] Phone: [phone] Relation: Other  Code Status:  DNR Goals of care: Advanced Directive information Advanced Directives 05/09/2017  Does Patient Have a Medical Advance Directive? Yes  Type of Advance Directive Out of facility DNR (pink MOST or yellow form);Living will;Healthcare Power of Attorney  Does patient want to make changes to medical advance directive? -  Copy of [place] in Chart? Yes  Pre-existing out of facility DNR order (yellow form or pink MOST form) Yellow form placed in chart (order not valid for inpatient use)     Chief Complaint  Patient presents with  . Acute Visit    leg redness with pustules    HPI:  Pt is a 83 y.o. female seen today for an acute visit for BLE erythema worsening for two days, now with small pustules. She has chronic venous stasis with dermatitis and uses clobetasol 5 days a week with aquaphor for moisture. The staff report that the redness is worsening from baseline and spreading above the knee She has not had a fever, malaise, pain, or drainage. Slight increase in edema noted.  She is diabetic and on victoza, jardiance, and  tradjenta Lab Results  Component Value Date   HGBA1C 7.5 04/18/2017      Past Medical History:  Diagnosis Date  . Abnormality of gait 11/18/2008  . Anxiety   . Basilar artery syndrome   . Closed fracture of intertrochanteric section of femur ([place]) 03/04/2010  . Corns and callosities 02/05/2008  . Depressive disorder   . Diabetes mellitus   . Diarrhea 12/13/2012  . Dysphagia   . Edema 10/25/2007  . Fecal smearing 04/05/2012  . Hyperlipidemia   . Hypertension   . Insomnia   . Macular degeneration (senile) of retina, unspecified 03/17/2011  . Neuropathy   . Osteoporosis, senile   . Other atopic dermatitis and related conditions 12/13/2012  . Pain in joint, ankle and foot 08/07/2008  . Pain in joint, pelvic region and thigh 10/2008  . Pain in limb 11/18/2008  . Paroxysmal atrial tachycardia ([place])   . Peripheral vascular disease, unspecified ([place]) 08/14/2012  . Plantar fascial fibromatosis   . Rash and other nonspecific skin eruption 12/21/2010  . Rickets, active   . Spinal stenosis, unspecified region other than cervical 11/18/2008  . TIA (transient ischemic attack)   . Transient ischemic attack (TIA), and cerebral infarction without residual deficits(V12.54) 10/28/2002  . Type II or unspecified type diabetes mellitus with peripheral circulatory disorders, uncontrolled(250.72) 02/12/2013  . Unspecified hereditary and idiopathic peripheral neuropathy 03/17/2011  . Unspecified urinary incontinence 05/13/2008  . Venous insufficiency   . Vitamin D deficiency 11/07/2007   Past Surgical History:  Procedure Laterality Date  . BREAST MASS EXCISION    . CHOLECYSTECTOMY  1964  . COLONOSCOPY  12/18/2010   Dr Oletta Lamas  . DILATION  AND CURETTAGE OF UTERUS  2003   post menopausal bleeding. Bicornate uterus/doble cervix, benign path  . ESOPHAGOGASTRODUODENOSCOPY (EGD) WITH PROPOFOL N/A 11/07/2015   Procedure: ESOPHAGOGASTRODUODENOSCOPY (EGD) WITH PROPOFOL;  Surgeon: Carol Ada, MD;  Location: [facility];   Service: Endoscopy;  Laterality: N/A;  . ESOPHAGOGASTRODUODENOSCOPY ENDOSCOPY  12/18/2010   Dr. Oletta Lamas slight gastritis  . HIP FRACTURE SURGERY Right 02/2010  . INCISION / DRAINAGE HAND / FINGER  2001   cat bite  . TONSILLECTOMY AND ADENOIDECTOMY     as a child    Allergies  Allergen Reactions  . Codeine     UNKNOWN  . Tramadol     UNKNOWN  . Vesicare [Solifenacin Succinate]     UNKNOWN    Outpatient Encounter Prescriptions as of 05/30/2017  Medication Sig  . atenolol (TENORMIN) 25 MG tablet Take 12.5 mg by mouth every morning.   . calcium-vitamin D (OSCAL WITH D) 500-200 MG-UNIT per tablet Take 1 tablet by mouth every morning.   . clobetasol cream (TEMOVATE) 0.05 % To bilateral legs from the Knee down every day before compression stockings applied Monday thur Friday  . clopidogrel (PLAVIX) 75 MG tablet Take 1 tablet (75 mg total) by mouth daily.  . Emollient (EUCERIN) lotion Apply 1 mL topically as needed for dry skin.  Marland Kitchen empagliflozin (JARDIANCE) 10 MG TABS tablet Take 10 mg by mouth daily.  Marland Kitchen gabapentin (NEURONTIN) 100 MG capsule Take 100 mg by mouth 3 (three) times daily.   Marland Kitchen linagliptin (TRADJENTA) 5 MG TABS tablet Take 5 mg by mouth daily. Every evening  . Liraglutide (VICTOZA [place]) Inject 0.6 mg into the skin daily. 0.6 mg qd for 1 week then 1.2 mg qd  . memantine (NAMENDA) 10 MG tablet Take 10 mg by mouth 2 (two) times daily.  Marland Kitchen nystatin cream (MYCOSTATIN) Apply 1 application topically 2 (two) times daily.   . pantoprazole (PROTONIX) 40 MG tablet Take 1 tablet (40 mg total) by mouth 2 (two) times daily before a meal.  . saccharomyces boulardii (FLORASTOR) 250 MG capsule Take 250 mg by mouth 2 (two) times daily.  . sertraline (ZOLOFT) 50 MG tablet Take 50 mg by mouth daily.  Marland Kitchen spironolactone (ALDACTONE) 50 MG tablet Take 50 mg by mouth daily.   . vitamin B-12 (CYANOCOBALAMIN) 1000 MCG tablet Take 1,000 mcg by mouth every morning. Take one tablet daily   No  facility-administered encounter medications on file as of 05/30/2017.     Review of Systems  Constitutional: Negative for activity change, appetite change, chills, diaphoresis, fatigue, fever and unexpected weight change.  HENT: Negative for congestion.   Respiratory: Negative for cough, shortness of breath and wheezing.   Cardiovascular: Positive for leg swelling. Negative for chest pain and palpitations.  Gastrointestinal: Negative for abdominal distention, abdominal pain, constipation and diarrhea.  Genitourinary: Negative for difficulty urinating and dysuria.  Musculoskeletal: Positive for gait problem (uses walker). Negative for arthralgias, back pain, joint swelling and myalgias.  Skin: Positive for color change and rash.  Neurological: Negative for dizziness, tremors, seizures, syncope, facial asymmetry, speech difficulty, weakness, light-headedness, numbness and headaches.  Psychiatric/Behavioral: Positive for confusion (memory loss). Negative for agitation and behavioral problems.    Immunization History  Administered Date(s) Administered  . Influenza Inj Mdck Quad Pf 08/05/2016  . Influenza Whole 08/01/2013  . Influenza-Unspecified 08/01/2014, 07/31/2015, 08/05/2016  . PPD Test 03/09/2012  . Pneumococcal Conjugate-13 03/04/2015  . Pneumococcal Polysaccharide-23 10/18/1998  . Td 11/04/2011   Pertinent  Health Maintenance Due  Topic Date Due  . OPHTHALMOLOGY EXAM  04/23/2017  . INFLUENZA VACCINE  05/18/2017  . HEMOGLOBIN A1C  10/19/2017  . FOOT EXAM  04/14/2018  . URINE MICROALBUMIN  04/18/2018  . DEXA SCAN  Completed  . PNA vac Low Risk Adult  Completed   Fall Risk  03/10/2016 09/10/2015 09/02/2014 08/29/2013  Falls in the past year? No No No No  Risk for fall due to : - History of fall(s);Impaired balance/gait;Impaired mobility;Mental status change - -   Functional Status Survey:    Vitals:   05/30/17 1029  BP: (!) 152/65  Pulse: (!) 52  Resp: 19  Temp: (!)  97.2 F (36.2 C)  SpO2: 94%  Weight: 158 lb 3.2 oz (71.8 kg)   Body mass index is 24.78 kg/m.  Wt Readings from Last 3 Encounters:  05/30/17 158 lb 3.2 oz (71.8 kg)  05/09/17 158 lb 1.6 oz (71.7 kg)  04/14/17 163 lb 6.4 oz (74.1 kg)    Physical Exam  Constitutional: She is oriented to person, place, and time. No distress.  HENT:  Head: Normocephalic and atraumatic.  Neck: No JVD present.  Cardiovascular: Normal rate and regular rhythm.   Murmur heard. Pulmonary/Chest: Effort normal and breath sounds normal. No respiratory distress. She has no wheezes.  Abdominal: Soft. Bowel sounds are normal.  Neurological: She is alert and oriented to person, place, and time.  Skin: Skin is warm and dry. Rash (erythema to BLE extending from both feet to slightly above the knee. No drainage. Small pustules forming to BLE (5 pinpoint size noted)) noted. She is not diaphoretic.  No groin adenopathy or streaking. Right heel with small open crack due to dry skin  Psychiatric: She has a normal mood and affect.    Labs reviewed:  Recent Labs  11/08/16 02/08/17 0300 04/18/17  NA 139 139 135*  K 5.0 5.0 5.2  BUN 20 20 22*  CREATININE 1.1 1.1 1.3*    Recent Labs  04/18/17  AST 15  ALT 11  ALKPHOS 90    Recent Labs  02/08/17 0300 04/18/17  WBC 14.9 12.5  HGB 18.5* 17.8*  HCT 60* 57*  PLT 375 375   Lab Results  Component Value Date   TSH 1.69 08/27/2015   Lab Results  Component Value Date   HGBA1C 7.5 04/18/2017   Lab Results  Component Value Date   CHOL 173 08/27/2015   HDL 38 08/27/2015   LDLCALC 102 08/27/2015   TRIG 163 (A) 08/27/2015    Significant Diagnostic Results in last 30 days:  No results found.  Assessment/Plan 1. Cellulitis of lower extremity, unspecified laterality Noted to BLE, not systemically ill.  No adenopathy or streaking noted.  Doxycycline 100 mg BID for 10 days with food Monitor VS qshift while acutely ill [place] areas of redness and report if  no improvement or worsening  2) Chronic stasis dermatitis Continue Cloebetasol Mon- Fri Continue Eucerin qd to BLE (needs moisture to crack in right heel)  3) DMII with neuropathy Monitor CBG BID for 10 days and report if consistently > or equal to 300 Continue current mediations  Cindi Carbon, [place] [phone]

## 2017-06-03 ENCOUNTER — Non-Acute Institutional Stay (SKILLED_NURSING_FACILITY): Payer: Medicare Other | Admitting: Adult Health

## 2017-06-03 DIAGNOSIS — L03119 Cellulitis of unspecified part of limb: Secondary | ICD-10-CM

## 2017-06-03 DIAGNOSIS — I872 Venous insufficiency (chronic) (peripheral): Secondary | ICD-10-CM | POA: Diagnosis not present

## 2017-06-03 DIAGNOSIS — D72825 Bandemia: Secondary | ICD-10-CM

## 2017-06-03 DIAGNOSIS — C44320 Squamous cell carcinoma of skin of unspecified parts of face: Secondary | ICD-10-CM

## 2017-06-03 DIAGNOSIS — E1159 Type 2 diabetes mellitus with other circulatory complications: Secondary | ICD-10-CM

## 2017-06-03 NOTE — Assessment & Plan Note (Signed)
Bx complete, follow up with Dermatology 8/21

## 2017-06-03 NOTE — Progress Notes (Signed)
Location:  Occupational psychologist of Service:  SNF (31) Provider:   Cindi Carbon, ANP [place] [phone]   Gayland Curry, DO  Patient Care Team: Gayland Curry, DO as PCP - General (Geriatric Medicine) Community, Well Orpah Greek, MD as Consulting Physician (Gastroenterology) Lorretta Harp, MD as Consulting Physician (Cardiology) Garvin Fila, MD as Consulting Physician (Neurology) Bjorn Loser, MD as Consulting Physician (Urology) Pedro Earls, MD as Attending Physician [facility] Medicine)  Extended Emergency Contact Information Primary Emergency Contact: Allena Earing [place] of [place] Phone: [phone] Relation: Friend Secondary Emergency Contact: [facility]  [place] Phone: [phone] Relation: Other  Code Status:  DNR Goals of care: Advanced Directive information Advanced Directives 05/09/2017  Does Patient Have a Medical Advance Directive? Yes  Type of Advance Directive Out of facility DNR (pink MOST or yellow form);Living will;Healthcare Power of Attorney  Does patient want to make changes to medical advance directive? -  Copy of [place] in Chart? Yes  Pre-existing out of facility DNR order (yellow form or pink MOST form) Yellow form placed in chart (order not valid for inpatient use)     Chief Complaint  Patient presents with  . Medical Management of Chronic Issues    HPI:  Pt is a 83 y.o. female seen today for medical management of chronic diseases. She resides in skilled care.   Treated for cellulitis on 8/13 with 10 days of Doxycycline. Redness and swelling improved pustules have dried up. No fever.  Stasis dermatitis: uses clobetasol to BLE M-F.  Legs are chronically erythematous Wears compression hose  SCC to left cheek bx on 7/31, follow up with derm 8/21  Resident noted to have chronically elevated WBC and  Hgb and so a peripheral smear was ordered which showed:  Peripheral smear 05/18/17: erythrocytosis, mild neutrophilia with monocytosis, and adequate platelets.  CBC results below.    Lab Results  Component Value Date   WBC 12.5 04/18/2017   Lab Results  Component Value Date   HGB 17.8 (A) 04/18/2017    DM II CBG am 110-122 evening 93-114 Lab Results  Component Value Date   HGBA1C 7.5 04/18/2017     Functional status: ambulatory with a walker, continent/incontinent, continent of stool  Past Medical History:  Diagnosis Date  . Abnormality of gait 11/18/2008  . Anxiety   . Basilar artery syndrome   . Closed fracture of intertrochanteric section of femur ([place]) 03/04/2010  . Corns and callosities 02/05/2008  . Depressive disorder   . Diabetes mellitus   . Diarrhea 12/13/2012  . Dysphagia   . Edema 10/25/2007  . Fecal smearing 04/05/2012  . Hyperlipidemia   . Hypertension   . Insomnia   . Macular degeneration (senile) of retina, unspecified 03/17/2011  . Neuropathy   . Osteoporosis, senile   . Other atopic dermatitis and related conditions 12/13/2012  . Pain in joint, ankle and foot 08/07/2008  . Pain in joint, pelvic region and thigh 10/2008  . Pain in limb 11/18/2008  . Paroxysmal atrial tachycardia ([place])   . Peripheral vascular disease, unspecified ([place]) 08/14/2012  . Plantar fascial fibromatosis   . Rash and other nonspecific skin eruption 12/21/2010  . Rickets, active   . Spinal stenosis, unspecified region other than cervical 11/18/2008  . TIA (transient ischemic attack)   . Transient ischemic attack (TIA), and cerebral infarction without residual deficits(V12.54) 10/28/2002  . Type II or unspecified  type diabetes mellitus with peripheral circulatory disorders, uncontrolled(250.72) 02/12/2013  . Unspecified hereditary and idiopathic peripheral neuropathy 03/17/2011  . Unspecified urinary incontinence 05/13/2008  . Venous insufficiency   . Vitamin D deficiency 11/07/2007   Past  Surgical History:  Procedure Laterality Date  . BREAST MASS EXCISION    . CHOLECYSTECTOMY  1964  . COLONOSCOPY  12/18/2010   Dr Oletta Lamas  . DILATION AND CURETTAGE OF UTERUS  2003   post menopausal bleeding. Bicornate uterus/doble cervix, benign path  . ESOPHAGOGASTRODUODENOSCOPY (EGD) WITH PROPOFOL N/A 11/07/2015   Procedure: ESOPHAGOGASTRODUODENOSCOPY (EGD) WITH PROPOFOL;  Surgeon: Carol Ada, MD;  [facility];  Service: Endoscopy;  Laterality: N/A;  . ESOPHAGOGASTRODUODENOSCOPY ENDOSCOPY  12/18/2010   Dr. Oletta Lamas slight gastritis  . HIP FRACTURE SURGERY Right 02/2010  . INCISION / DRAINAGE HAND / FINGER  2001   cat bite  . TONSILLECTOMY AND ADENOIDECTOMY     as a child    Allergies  Allergen Reactions  . Codeine     UNKNOWN  . Tramadol     UNKNOWN  . Vesicare [Solifenacin Succinate]     UNKNOWN    Outpatient Encounter Prescriptions as of 06/03/2017  Medication Sig  . atenolol (TENORMIN) 25 MG tablet Take 12.5 mg by mouth every morning.   . calcium-vitamin D (OSCAL WITH D) 500-200 MG-UNIT per tablet Take 1 tablet by mouth every morning.   . clobetasol cream (TEMOVATE) 0.05 % To bilateral legs from the Knee down every day before compression stockings applied Monday thur Friday  . clopidogrel (PLAVIX) 75 MG tablet Take 1 tablet (75 mg total) by mouth daily.  Marland Kitchen doxycycline (DORYX) 100 MG EC tablet Take 100 mg by mouth 2 (two) times daily.  . Emollient (EUCERIN) lotion Apply 1 mL topically as needed for dry skin.  Marland Kitchen gabapentin (NEURONTIN) 100 MG capsule Take 100 mg by mouth 3 (three) times daily.   Marland Kitchen linagliptin (TRADJENTA) 5 MG TABS tablet Take 5 mg by mouth daily. Every evening  . Liraglutide (VICTOZA [place]) Inject 1.2 mg into the skin daily.   . memantine (NAMENDA) 10 MG tablet Take 10 mg by mouth 2 (two) times daily.  Marland Kitchen nystatin cream (MYCOSTATIN) Apply 1 application topically 2 (two) times daily as needed for dry skin.   . pantoprazole (PROTONIX) 40 MG tablet Take 1  tablet (40 mg total) by mouth 2 (two) times daily before a meal.  . saccharomyces boulardii (FLORASTOR) 250 MG capsule Take 250 mg by mouth 2 (two) times daily.  . sertraline (ZOLOFT) 50 MG tablet Take 50 mg by mouth daily.  Marland Kitchen spironolactone (ALDACTONE) 50 MG tablet Take 50 mg by mouth daily.   . vitamin B-12 (CYANOCOBALAMIN) 1000 MCG tablet Take 1,000 mcg by mouth every morning. Take one tablet daily  . empagliflozin (JARDIANCE) 10 MG TABS tablet Take 10 mg by mouth daily.   No facility-administered encounter medications on file as of 06/03/2017.     Review of Systems  Constitutional: Negative for chills, diaphoresis, fever, malaise/fatigue and weight loss.  Respiratory: Negative for cough, hemoptysis, sputum production and wheezing.   Cardiovascular: Positive for leg swelling. Negative for chest pain and PND.  Gastrointestinal: Negative for abdominal pain, constipation, diarrhea, nausea and vomiting.  Genitourinary: Negative for dysuria and urgency.  Musculoskeletal: Positive for joint pain. Negative for back pain, falls, myalgias and neck pain.  Skin: Positive for rash (chronic dermatitis). Negative for itching.  Neurological: Negative for tingling, tremors and weakness.  Psychiatric/Behavioral: Positive for memory loss. Negative  for depression. The patient does not have insomnia.     Immunization History  Administered Date(s) Administered  . Influenza Inj Mdck Quad Pf 08/05/2016  . Influenza Whole 08/01/2013  . Influenza-Unspecified 08/01/2014, 07/31/2015, 08/05/2016  . PPD Test 03/09/2012  . Pneumococcal Conjugate-13 03/04/2015  . Pneumococcal Polysaccharide-23 10/18/1998  . Td 11/04/2011   Pertinent  Health Maintenance Due  Topic Date Due  . OPHTHALMOLOGY EXAM  04/23/2017  . INFLUENZA VACCINE  05/18/2017  . HEMOGLOBIN A1C  10/19/2017  . FOOT EXAM  04/14/2018  . URINE MICROALBUMIN  04/18/2018  . DEXA SCAN  Completed  . PNA vac Low Risk Adult  Completed   Fall Risk   03/10/2016 09/10/2015 09/02/2014 08/29/2013  Falls in the past year? No No No No  Risk for fall due to : - History of fall(s);Impaired balance/gait;Impaired mobility;Mental status change - -   Functional Status Survey:    Vitals:   06/03/17 1048  BP: 98/61  Pulse: 73  Resp: 19  Temp: 98.2 F (36.8 C)  SpO2: 93%  Weight: 158 lb 3.2 oz (71.8 kg)   Body mass index is 24.78 kg/m.  Wt Readings from Last 3 Encounters:  06/03/17 158 lb 3.2 oz (71.8 kg)  05/30/17 158 lb 3.2 oz (71.8 kg)  05/09/17 158 lb 1.6 oz (71.7 kg)   Physical Exam  Constitutional: No distress.  HENT:  Head: Normocephalic and atraumatic.  Neck: No JVD present. No thyromegaly present.  Cardiovascular: Normal rate and regular rhythm.   Murmur heard. BLE: Pustules have dried up, erythema reduced but still present which is chronic. Pulmonary/Chest: Effort normal and breath sounds normal. No respiratory distress.  Abdominal: Soft. Bowel sounds are normal.  rotund  Musculoskeletal: She exhibits no edema or tenderness.  kyphosis  Lymphadenopathy:    She has no cervical adenopathy.  Neurological: She is alert.  Oriented to self, place, situation, not time  Skin: Skin is warm and dry. She is not diaphoretic.  Psychiatric: She has a normal mood and affect.  Nursing note and vitals reviewed.   Labs reviewed:  Recent Labs  11/08/16 02/08/17 0300 04/18/17  NA 139 139 135*  K 5.0 5.0 5.2  BUN 20 20 22*  CREATININE 1.1 1.1 1.3*    Recent Labs  04/18/17  AST 15  ALT 11  ALKPHOS 90    Recent Labs  02/08/17 0300 04/18/17  WBC 14.9 12.5  HGB 18.5* 17.8*  HCT 60* 57*  PLT 375 375   Lab Results  Component Value Date   TSH 1.69 08/27/2015   Lab Results  Component Value Date   HGBA1C 7.5 04/18/2017   Lab Results  Component Value Date   CHOL 173 08/27/2015   HDL 38 08/27/2015   LDLCALC 102 08/27/2015   TRIG 163 (A) 08/27/2015    Significant Diagnostic Results in last 30 days:  No results  found.  Assessment/Plan  1) Cellulitis to BLE Improved, continue Doxycycline to complete 10 days course  Type 2 diabetes mellitus with circulatory disorder, without long-term current use of insulin (HCC) Improved A1C, continue current medication. Monitor renal function periodically.  Resident refused diabetic eye exam  Stasis dermatitis Continue Clobetasol ointment M-F and Aquaphor daily  Leukocytosis With elevated Hgb, No pathologic findings on peripheral smear.  SCC (squamous cell carcinoma), face Bx complete, follow up with Dermatology 8/21    Family/ staff Communication: discussed with staff/resident  Labs/tests ordered:  Resident refused diabetic eye exam and dexa scan

## 2017-06-03 NOTE — Assessment & Plan Note (Signed)
Improved A1C, continue current medication. Monitor renal function periodically.  Resident refused diabetic eye exam

## 2017-06-03 NOTE — Assessment & Plan Note (Addendum)
With elevated Hgb, No pathologic findings on peripheral smear.

## 2017-06-03 NOTE — Assessment & Plan Note (Signed)
Continue Clobetasol ointment M-F and Aquaphor daily

## 2017-06-13 ENCOUNTER — Non-Acute Institutional Stay (SKILLED_NURSING_FACILITY): Payer: Medicare Other | Admitting: Adult Health

## 2017-06-13 ENCOUNTER — Encounter: Payer: Self-pay | Admitting: Adult Health

## 2017-06-13 DIAGNOSIS — R460 Very low level of personal hygiene: Secondary | ICD-10-CM

## 2017-06-13 DIAGNOSIS — B3749 Other urogenital candidiasis: Secondary | ICD-10-CM

## 2017-06-13 NOTE — Progress Notes (Signed)
Location:  Occupational psychologist of Service:  SNF (31) Provider:   Cindi Carbon, ANP [place] [phone]   Gayland Curry, DO  Patient Care Team: Gayland Curry, DO as PCP - General (Geriatric Medicine) Community, [facility] Orpah Greek, MD as Consulting Physician (Gastroenterology) Lorretta Harp, MD as Consulting Physician (Cardiology) Garvin Fila, MD as Consulting Physician (Neurology) Bjorn Loser, MD as Consulting Physician (Urology) Pedro Earls, MD as Attending Physician [facility] Medicine)  Extended Emergency Contact Information Primary Emergency Contact: Allena Earing [place] of [place] Phone: [phone] Relation: Friend Secondary Emergency Contact: [facility]  [place] Phone: [phone] Relation: Other  Code Status:  DNR Goals of care: Advanced Directive information Advanced Directives 05/09/2017  Does Patient Have a Medical Advance Directive? Yes  Type of Advance Directive Out of facility DNR (pink MOST or yellow form);Living will;Healthcare Power of Attorney  Does patient want to make changes to medical advance directive? -  Copy of [place] [address] in Chart? Yes  Pre-existing out of facility DNR order (yellow form or pink MOST form) Yellow form placed in chart (order not valid for inpatient use)     Chief Complaint  Patient presents with  . Acute Visit    itchy vaginal/buttock rash    HPI:  Pt is a 83 y.o. female seen today for an acute visit for vaginal/buttock itching and rash. Staff report that she has a chronic sensation to scratch her vaginal area. She does this vigorously and causes excoriation. She also has chronic dry scaly skin to her buttocks and the staff applies barrier cream daily to this area. The rash and itching to the vaginal area has been present for the past 3 days during this flare but has off and on been an issues  for months per the nurse.  Kara Harris has dementia and frequently refuses personal hygiene including the whirlpool. She is intermittently incontinent of urine and continent of stool.    Past Medical History:  Diagnosis Date  . Abnormality of gait 11/18/2008  . Anxiety   . Basilar artery syndrome   . Closed fracture of intertrochanteric section of femur ([place]) 03/04/2010  . Corns and callosities 02/05/2008  . Depressive disorder   . Diabetes mellitus   . Diarrhea 12/13/2012  . Dysphagia   . Edema 10/25/2007  . Fecal smearing 04/05/2012  . Hyperlipidemia   . Hypertension   . Insomnia   . Macular degeneration (senile) of retina, unspecified 03/17/2011  . Neuropathy   . Osteoporosis, senile   . Other atopic dermatitis and related conditions 12/13/2012  . Pain in joint, ankle and foot 08/07/2008  . Pain in joint, pelvic region and thigh 10/2008  . Pain in limb 11/18/2008  . Paroxysmal atrial tachycardia ([place])   . Peripheral vascular disease, unspecified ([place]) 08/14/2012  . Plantar fascial fibromatosis   . Rash and other nonspecific skin eruption 12/21/2010  . Rickets, active   . Spinal stenosis, unspecified region other than cervical 11/18/2008  . TIA (transient ischemic attack)   . Transient ischemic attack (TIA), and cerebral infarction without residual deficits(V12.54) 10/28/2002  . Type II or unspecified type diabetes mellitus with peripheral circulatory disorders, uncontrolled(250.72) 02/12/2013  . Unspecified hereditary and idiopathic peripheral neuropathy 03/17/2011  . Unspecified urinary incontinence 05/13/2008  . Venous insufficiency   . Vitamin D deficiency 11/07/2007   Past Surgical History:  Procedure Laterality Date  . BREAST MASS EXCISION    .  CHOLECYSTECTOMY  1964  . COLONOSCOPY  12/18/2010   Dr Oletta Lamas  . DILATION AND CURETTAGE OF UTERUS  2003   post menopausal bleeding. Bicornate uterus/doble cervix, benign path  . ESOPHAGOGASTRODUODENOSCOPY (EGD) WITH PROPOFOL N/A 11/07/2015    Procedure: ESOPHAGOGASTRODUODENOSCOPY (EGD) WITH PROPOFOL;  Surgeon: Carol Ada, MD;  Location: [facility] ENDOSCOPY;  Service: Endoscopy;  Laterality: N/A;  . ESOPHAGOGASTRODUODENOSCOPY ENDOSCOPY  12/18/2010   Dr. Oletta Lamas slight gastritis  . HIP FRACTURE SURGERY Right 02/2010  . INCISION / DRAINAGE HAND / FINGER  2001   cat bite  . TONSILLECTOMY AND ADENOIDECTOMY     as a child    Allergies  Allergen Reactions  . Codeine     UNKNOWN  . Tramadol     UNKNOWN  . Vesicare [Solifenacin Succinate]     UNKNOWN    Outpatient Encounter Prescriptions as of 06/13/2017  Medication Sig  . atenolol (TENORMIN) 25 MG tablet Take 12.5 mg by mouth every morning.   . calcium-vitamin D (OSCAL WITH D) 500-200 MG-UNIT per tablet Take 1 tablet by mouth every morning.   . clobetasol cream (TEMOVATE) 0.05 % To bilateral legs from the Knee down every day before compression stockings applied Monday thur Friday  . clopidogrel (PLAVIX) 75 MG tablet Take 1 tablet (75 mg total) by mouth daily.  Marland Kitchen doxycycline (DORYX) 100 MG EC tablet Take 100 mg by mouth 2 (two) times daily.  . Emollient (EUCERIN) lotion Apply 1 mL topically as needed for dry skin.  Marland Kitchen empagliflozin (JARDIANCE) 10 MG TABS tablet Take 10 mg by mouth daily.  Marland Kitchen gabapentin (NEURONTIN) 100 MG capsule Take 100 mg by mouth 3 (three) times daily.   Marland Kitchen linagliptin (TRADJENTA) 5 MG TABS tablet Take 5 mg by mouth daily. Every evening  . Liraglutide (VICTOZA [place]) Inject 1.2 mg into the skin daily.   . memantine (NAMENDA) 10 MG tablet Take 10 mg by mouth 2 (two) times daily.  Marland Kitchen nystatin cream (MYCOSTATIN) Apply 1 application topically 2 (two) times daily as needed for dry skin.   . pantoprazole (PROTONIX) 40 MG tablet Take 1 tablet (40 mg total) by mouth 2 (two) times daily before a meal.  . saccharomyces boulardii (FLORASTOR) 250 MG capsule Take 250 mg by mouth 2 (two) times daily.  . sertraline (ZOLOFT) 50 MG tablet Take 50 mg by mouth daily.  Marland Kitchen spironolactone  (ALDACTONE) 50 MG tablet Take 50 mg by mouth daily.   . vitamin B-12 (CYANOCOBALAMIN) 1000 MCG tablet Take 1,000 mcg by mouth every morning. Take one tablet daily   No facility-administered encounter medications on file as of 06/13/2017.     Review of Systems  Constitutional: Negative for activity change, appetite change, chills, diaphoresis, fatigue, fever and unexpected weight change.  Genitourinary: Positive for vaginal discharge. Negative for decreased urine volume, difficulty urinating, dysuria, flank pain, frequency, pelvic pain, urgency, vaginal bleeding and vaginal pain.  Skin: Positive for color change, rash and wound.       Itching   Psychiatric/Behavioral:       Memory loss    Immunization History  Administered Date(s) Administered  . Influenza Inj Mdck Quad Pf 08/05/2016  . Influenza Whole 08/01/2013  . Influenza-Unspecified 08/01/2014, 07/31/2015, 08/05/2016  . PPD Test 03/09/2012  . Pneumococcal Conjugate-13 03/04/2015  . Pneumococcal Polysaccharide-23 10/18/1998  . Td 11/04/2011   Pertinent  Health Maintenance Due  Topic Date Due  . OPHTHALMOLOGY EXAM  04/23/2017  . INFLUENZA VACCINE  05/18/2017  . HEMOGLOBIN A1C  10/19/2017  .  FOOT EXAM  04/14/2018  . URINE MICROALBUMIN  04/18/2018  . DEXA SCAN  Completed  . PNA vac Low Risk Adult  Completed   Fall Risk  03/10/2016 09/10/2015 09/02/2014 08/29/2013  Falls in the past year? No No No No  Risk for fall due to : - History of fall(s);Impaired balance/gait;Impaired mobility;Mental status change - -   Functional Status Survey:    Vitals:   06/13/17 1345  BP: 110/70  Pulse: 64  Resp: 18  Temp: (!) 97.4 F (36.3 C)  SpO2: 94%  Weight: 158 lb 3.2 oz (71.8 kg)   Body mass index is 24.78 kg/m. Physical Exam  Constitutional: No distress.  Genitourinary: Vaginal discharge (yellow/white with foul odor) found.  Neurological: She is alert.  Oriented to self, place, situation, not time  Skin: Skin is warm and  dry. Rash noted. She is not diaphoretic. There is erythema (note to vaginal area, buttocks, rectum, and inner thigh.  Dry scaly skin to buttocks with one small open area from a flake of dry skin with 100%  erythema).  Psychiatric: She has a normal mood and affect.    Labs reviewed:  Recent Labs  11/08/16 02/08/17 0300 04/18/17  NA 139 139 135*  K 5.0 5.0 5.2  BUN 20 20 22*  CREATININE 1.1 1.1 1.3*    Recent Labs  04/18/17  AST 15  ALT 11  ALKPHOS 90    Recent Labs  02/08/17 0300 04/18/17  WBC 14.9 12.5  HGB 18.5* 17.8*  HCT 60* 57*  PLT 375 375   Lab Results  Component Value Date   TSH 1.69 08/27/2015   Lab Results  Component Value Date   HGBA1C 7.5 04/18/2017   Lab Results  Component Value Date   CHOL 173 08/27/2015   HDL 38 08/27/2015   LDLCALC 102 08/27/2015   TRIG 163 (A) 08/27/2015    Significant Diagnostic Results in last 30 days:  No results found.  Assessment/Plan  1. Candidiasis of perineum Significant and very itchy Diflucan 150 mg 1 dose then repeat in 72 hrs Lotrisone to vaginal/buttock area and inner thighs BID for 10 days  2. Poor hygiene The staff will continue to try to encourage her to stay clean and dry and go to the whirlpool regularly.      Family/ staff Communication: discussed with staff  Labs/tests ordered:  NA

## 2017-06-21 ENCOUNTER — Non-Acute Institutional Stay (SKILLED_NURSING_FACILITY): Payer: Medicare Other | Admitting: Internal Medicine

## 2017-06-21 ENCOUNTER — Encounter: Payer: Self-pay | Admitting: Internal Medicine

## 2017-06-21 DIAGNOSIS — F0281 Dementia in other diseases classified elsewhere with behavioral disturbance: Secondary | ICD-10-CM

## 2017-06-21 DIAGNOSIS — G301 Alzheimer's disease with late onset: Secondary | ICD-10-CM | POA: Diagnosis not present

## 2017-06-21 DIAGNOSIS — E1143 Type 2 diabetes mellitus with diabetic autonomic (poly)neuropathy: Secondary | ICD-10-CM | POA: Diagnosis not present

## 2017-06-21 DIAGNOSIS — K317 Polyp of stomach and duodenum: Secondary | ICD-10-CM

## 2017-06-21 DIAGNOSIS — E538 Deficiency of other specified B group vitamins: Secondary | ICD-10-CM

## 2017-06-21 DIAGNOSIS — F4321 Adjustment disorder with depressed mood: Secondary | ICD-10-CM

## 2017-06-21 DIAGNOSIS — F02818 Dementia in other diseases classified elsewhere, unspecified severity, with other behavioral disturbance: Secondary | ICD-10-CM

## 2017-06-21 DIAGNOSIS — B3749 Other urogenital candidiasis: Secondary | ICD-10-CM

## 2017-06-21 NOTE — Progress Notes (Signed)
Patient ID: Kara Harris, female   DOB: Feb 23, 1923, 83 y.o.   MRN: [id number]  Location:  [place] Room Number: 118 Place of Service:  SNF [phone]) Provider:   Gayland Curry, DO  Patient Care Team: Gayland Curry, DO as PCP - General (Geriatric Medicine) Community, Well Orpah Greek, MD as Consulting Physician (Gastroenterology) Lorretta Harp, MD as Consulting Physician (Cardiology) Garvin Fila, MD as Consulting Physician (Neurology) Bjorn Loser, MD as Consulting Physician (Urology) Pedro Earls, MD as Attending Physician (Family Medicine)  Extended Emergency Contact Information Primary Emergency Contact: Allena Earing [place] of [place] Phone: [phone] Relation: Friend Secondary Emergency Contact: [facility]  [place] Phone: [phone] Relation: Other  Code Status:  DNR Goals of care: Advanced Directive information Advanced Directives 06/21/2017  Does Patient Have a Medical Advance Directive? -  Type of Advance Directive Out of facility DNR (pink MOST or yellow form);Living will;Healthcare Power of Attorney  Does patient want to make changes to medical advance directive? -  Copy of [place] in Chart? Yes  Pre-existing out of facility DNR order (yellow form or pink MOST form) Yellow form placed in chart (order not valid for inpatient use)   Chief Complaint  Patient presents with  . Medical Management of Chronic Issues    routine visit    HPI:  Pt is a 83 y.o. female seen today for medical management of chronic diseases.    Dementia:  Progressing gradually.  Retells her story of meeting the chimpanzee on her trip years ago.  Often refuses baths and requires a lot of encouragement.  Did accept bathing in shower this am, and had whirlpool last week.  On namenda 10mg  po bid.    DMII:  Has been hard to control with her particular eating  habits (love of pimento cheese sandwiches).  Just take off of jardiance due to terrible yeast infection.  Remains on victoza, tradjenta.  Metformin caused diarrhea. Is on gabapentin for neuropathy.  Osteoporosis:  Due to now living in skilled care, prolia became unaffordable so it was stopped.  Remains on ca with D.    Vaginal yeast infection:  Got one does of diflucan and 3 day monistat course.  Improving purplish buttocks also.  B12 deficiency:  Remains on supplement.    H/o gastric polyps and GI bleed:  Remains on protonix which should remain.    Depression/adjustment disorder to snf:  Cont zoloft 50mg  po daily.   Past Medical History:  Diagnosis Date  . Abnormality of gait 11/18/2008  . Anxiety   . Basilar artery syndrome   . Closed fracture of intertrochanteric section of femur ([place]) 03/04/2010  . Corns and callosities 02/05/2008  . Depressive disorder   . Diabetes mellitus   . Diarrhea 12/13/2012  . Dysphagia   . Edema 10/25/2007  . Fecal smearing 04/05/2012  . Hyperlipidemia   . Hypertension   . Insomnia   . Macular degeneration (senile) of retina, unspecified 03/17/2011  . Neuropathy   . Osteoporosis, senile   . Other atopic dermatitis and related conditions 12/13/2012  . Pain in joint, ankle and foot 08/07/2008  . Pain in joint, pelvic region and thigh 10/2008  . Pain in limb 11/18/2008  . Paroxysmal atrial tachycardia ([place])   . Peripheral vascular disease, unspecified ([place]) 08/14/2012  . Plantar fascial fibromatosis   . Rash and other nonspecific skin eruption 12/21/2010  . Rickets, active   .  Spinal stenosis, unspecified region other than cervical 11/18/2008  . TIA (transient ischemic attack)   . Transient ischemic attack (TIA), and cerebral infarction without residual deficits(V12.54) 10/28/2002  . Type II or unspecified type diabetes mellitus with peripheral circulatory disorders, uncontrolled(250.72) 02/12/2013  . Unspecified hereditary and idiopathic peripheral neuropathy  03/17/2011  . Unspecified urinary incontinence 05/13/2008  . Venous insufficiency   . Vitamin D deficiency 11/07/2007   Past Surgical History:  Procedure Laterality Date  . BREAST MASS EXCISION    . CHOLECYSTECTOMY  1964  . COLONOSCOPY  12/18/2010   Dr Oletta Lamas  . DILATION AND CURETTAGE OF UTERUS  2003   post menopausal bleeding. Bicornate uterus/doble cervix, benign path  . ESOPHAGOGASTRODUODENOSCOPY (EGD) WITH PROPOFOL N/A 11/07/2015   Procedure: ESOPHAGOGASTRODUODENOSCOPY (EGD) WITH PROPOFOL;  Surgeon: Carol Ada, MD;  [facility];  Service: Endoscopy;  Laterality: N/A;  . ESOPHAGOGASTRODUODENOSCOPY ENDOSCOPY  12/18/2010   Dr. Oletta Lamas slight gastritis  . HIP FRACTURE SURGERY Right 02/2010  . INCISION / DRAINAGE HAND / FINGER  2001   cat bite  . TONSILLECTOMY AND ADENOIDECTOMY     as a child    Allergies  Allergen Reactions  . Codeine     UNKNOWN  . Tramadol     UNKNOWN  . Vesicare [Solifenacin Succinate]     UNKNOWN    Outpatient Encounter Prescriptions as of 06/21/2017  Medication Sig  . atenolol (TENORMIN) 25 MG tablet Take 12.5 mg by mouth every morning.   . calcium-vitamin D (OSCAL WITH D) 500-200 MG-UNIT per tablet Take 1 tablet by mouth every morning.   . clobetasol cream (TEMOVATE) 0.05 % To bilateral legs from the Knee down every day before compression stockings applied Monday thur Friday  . clotrimazole-betamethasone (LOTRISONE) cream Apply 1 application topically 2 (two) times daily.  . Emollient (EUCERIN) lotion Apply 1 mL topically as needed for dry skin.  . fluorouracil (EFUDEX) 5 % cream Apply 1 application topically 2 (two) times daily.  Marland Kitchen gabapentin (NEURONTIN) 100 MG capsule Take 100 mg by mouth 3 (three) times daily.   Marland Kitchen linagliptin (TRADJENTA) 5 MG TABS tablet Take 5 mg by mouth daily. Every evening  . Liraglutide (VICTOZA [place]) Inject 1.2 mg into the skin daily.   . memantine (NAMENDA) 10 MG tablet Take 10 mg by mouth 2 (two) times daily.  Marland Kitchen  nystatin cream (MYCOSTATIN) Apply 1 application topically 2 (two) times daily.  . pantoprazole (PROTONIX) 40 MG tablet Take 1 tablet (40 mg total) by mouth 2 (two) times daily before a meal.  . saccharomyces boulardii (FLORASTOR) 250 MG capsule Take 250 mg by mouth 2 (two) times daily.  . sertraline (ZOLOFT) 50 MG tablet Take 50 mg by mouth daily.  Marland Kitchen spironolactone (ALDACTONE) 50 MG tablet Take 50 mg by mouth daily.   . vitamin B-12 (CYANOCOBALAMIN) 1000 MCG tablet Take 1,000 mcg by mouth every morning. Take one tablet daily  . [DISCONTINUED] clopidogrel (PLAVIX) 75 MG tablet Take 1 tablet (75 mg total) by mouth daily.  . [DISCONTINUED] doxycycline (DORYX) 100 MG EC tablet Take 100 mg by mouth 2 (two) times daily.  . [DISCONTINUED] empagliflozin (JARDIANCE) 10 MG TABS tablet Take 10 mg by mouth daily.  . [DISCONTINUED] nystatin cream (MYCOSTATIN) Apply 1 application topically 2 (two) times daily as needed for dry skin.    No facility-administered encounter medications on file as of 06/21/2017.     Review of Systems  Constitutional: Negative for activity change, appetite change, chills and fever.  Respiratory:  Negative for chest tightness and shortness of breath.   Cardiovascular: Positive for leg swelling. Negative for chest pain.  Gastrointestinal: Positive for diarrhea. Negative for abdominal pain, constipation, nausea and vomiting.  Genitourinary: Negative for dysuria.  Musculoskeletal: Positive for gait problem.  Neurological: Negative for dizziness.  Psychiatric/Behavioral: Positive for agitation and confusion. Negative for sleep disturbance.    Immunization History  Administered Date(s) Administered  . Influenza Inj Mdck Quad Pf 08/05/2016  . Influenza Whole 08/01/2013  . Influenza-Unspecified 08/01/2014, 07/31/2015, 08/05/2016  . PPD Test 03/09/2012  . Pneumococcal Conjugate-13 03/04/2015  . Pneumococcal Polysaccharide-23 10/18/1998  . Td 11/04/2011   Pertinent  Health  Maintenance Due  Topic Date Due  . OPHTHALMOLOGY EXAM  04/23/2017  . INFLUENZA VACCINE  05/18/2017  . HEMOGLOBIN A1C  10/19/2017  . FOOT EXAM  04/14/2018  . URINE MICROALBUMIN  04/18/2018  . DEXA SCAN  Completed  . PNA vac Low Risk Adult  Completed   Fall Risk  03/10/2016 09/10/2015 09/02/2014 08/29/2013  Falls in the past year? No No No No  Risk for fall due to : - History of fall(s);Impaired balance/gait;Impaired mobility;Mental status change - -   Functional Status Survey:    Vitals:   06/21/17 1427  BP: 139/80  Pulse: 68  Resp: 16  Temp: 98.1 F (36.7 C)  TempSrc: Oral  SpO2: 93%  Weight: 157 lb (71.2 kg)   Body mass index is 24.59 kg/m. Physical Exam  Constitutional: She appears well-developed. No distress.  Cardiovascular: Normal rate, regular rhythm, normal heart sounds and intact distal pulses.   Pulmonary/Chest: Effort normal and breath sounds normal. No respiratory distress.  Abdominal: Soft. Bowel sounds are normal. She exhibits no distension. There is no tenderness.  Musculoskeletal: Normal range of motion.  Walks with walker with stooped posture  Neurological: She is alert.  Psychiatric: She has a normal mood and affect.    Labs reviewed:  Recent Labs  11/08/16 02/08/17 0300 04/18/17  NA 139 139 135*  K 5.0 5.0 5.2  BUN 20 20 22*  CREATININE 1.1 1.1 1.3*    Recent Labs  04/18/17  AST 15  ALT 11  ALKPHOS 90    Recent Labs  02/08/17 0300 04/18/17  WBC 14.9 12.5  HGB 18.5* 17.8*  HCT 60* 57*  PLT 375 375   Lab Results  Component Value Date   TSH 1.69 08/27/2015   Lab Results  Component Value Date   HGBA1C 7.5 04/18/2017   Lab Results  Component Value Date   CHOL 173 08/27/2015   HDL 38 08/27/2015   LDLCALC 102 08/27/2015   TRIG 163 (A) 08/27/2015    Assessment/Plan 1. Controlled type 2 diabetes mellitus with diabetic autonomic neuropathy, without long-term current use of insulin (HCC) -control has remained good despite  stopping jardiance -CBGs 114, 101, 102  2. Late onset Alzheimer's disease with behavioral disturbance -progressing gradually and with some refusal of care, cont to encourage regular bathing  3. Candidiasis of perineum -improving with monistat, diflucan  4. Adjustment disorder with depressed mood -having some behaviors and agitation--continues on zoloft  5. B12 deficiency -cont b12 supplement  6. Multiple gastric polyps -cont protonix  Family/ staff Communication: discussed with SNF nurse  Labs/tests ordered:  No new, needs hba1c every 3-4 mos  Morgen Ritacco L. Wilbern Pennypacker, D.O. [facility] [address], [place] [address] Cell Phone (Mon-Fri 8am-5pm):  [phone] On Call:  [phone] & follow prompts after 5pm & weekends  Office Phone:  [phone] Office Fax:  [phone]

## 2017-06-29 DIAGNOSIS — F4321 Adjustment disorder with depressed mood: Secondary | ICD-10-CM | POA: Insufficient documentation

## 2017-06-29 DIAGNOSIS — E538 Deficiency of other specified B group vitamins: Secondary | ICD-10-CM | POA: Insufficient documentation

## 2017-07-14 DIAGNOSIS — B351 Tinea unguium: Secondary | ICD-10-CM | POA: Diagnosis not present

## 2017-07-14 DIAGNOSIS — L97421 Non-pressure chronic ulcer of left heel and midfoot limited to breakdown of skin: Secondary | ICD-10-CM | POA: Diagnosis not present

## 2017-07-14 DIAGNOSIS — E1140 Type 2 diabetes mellitus with diabetic neuropathy, unspecified: Secondary | ICD-10-CM | POA: Diagnosis not present

## 2017-07-14 DIAGNOSIS — L739 Follicular disorder, unspecified: Secondary | ICD-10-CM | POA: Diagnosis not present

## 2017-07-18 ENCOUNTER — Non-Acute Institutional Stay (SKILLED_NURSING_FACILITY): Payer: Medicare Other | Admitting: Adult Health

## 2017-07-18 DIAGNOSIS — F339 Major depressive disorder, recurrent, unspecified: Secondary | ICD-10-CM

## 2017-07-18 DIAGNOSIS — R634 Abnormal weight loss: Secondary | ICD-10-CM

## 2017-07-18 DIAGNOSIS — N183 Chronic kidney disease, stage 3 unspecified: Secondary | ICD-10-CM

## 2017-07-18 DIAGNOSIS — E1159 Type 2 diabetes mellitus with other circulatory complications: Secondary | ICD-10-CM

## 2017-07-18 DIAGNOSIS — L97411 Non-pressure chronic ulcer of right heel and midfoot limited to breakdown of skin: Secondary | ICD-10-CM | POA: Diagnosis not present

## 2017-07-18 DIAGNOSIS — G301 Alzheimer's disease with late onset: Secondary | ICD-10-CM

## 2017-07-18 DIAGNOSIS — F0281 Dementia in other diseases classified elsewhere with behavioral disturbance: Secondary | ICD-10-CM

## 2017-07-18 DIAGNOSIS — F02818 Dementia in other diseases classified elsewhere, unspecified severity, with other behavioral disturbance: Secondary | ICD-10-CM

## 2017-07-18 DIAGNOSIS — E11621 Type 2 diabetes mellitus with foot ulcer: Secondary | ICD-10-CM

## 2017-07-18 NOTE — Progress Notes (Signed)
Location:  Occupational psychologist of Service:  SNF (31) Provider:  Cindi Carbon, ANP [place] [phone]   Gayland Curry, DO  Patient Care Team: Gayland Curry, DO as PCP - General (Geriatric Medicine) Community, Well Orpah Greek, MD as Consulting Physician (Gastroenterology) Lorretta Harp, MD as Consulting Physician (Cardiology) Garvin Fila, MD as Consulting Physician (Neurology) Bjorn Loser, MD as Consulting Physician (Urology) Pedro Earls, MD as Attending Physician [facility] Medicine)  Extended Emergency Contact Information Primary Emergency Contact: Allena [place] Phone: [phone] Relation: Friend Secondary Emergency Contact: [facility]  [place] Phone: [phone] Relation: Other  Code Status:  DNR Goals of care: Advanced Directive information Advanced Directives 06/21/2017  Does Patient Have a Medical Advance Directive? -  Type of Advance Directive Out of facility DNR (pink MOST or yellow form);Living will;Healthcare Power of Attorney  Does patient want to make changes to medical advance directive? -  Copy of [place] in Chart? Yes  Pre-existing out of facility DNR order (yellow form or pink MOST form) Yellow form placed in chart (order not valid for inpatient use)     Chief Complaint  Patient presents with  . Medical Management of Chronic Issues    HPI:  Pt is a 83 y.o. female seen today for medical management of chronic diseases.  She resides in skilled care.  Memory loss: staff report worsening in lack of hygiene, apathetic, stays in room a lot 03/01/17: MMSE 25/30, currently namenda  Depression: denies symptoms, on zoloft with weight loss, poor appetite and the above  Weight loss: poor appetite Wt Readings from Last 3 Encounters:  07/22/17 157 lb 8 oz (71.4 kg)  06/21/17 157 lb (71.2 kg)    06/13/17 158 lb 3.2 oz (71.8 kg)  Stable over the past few months but significant drop from 180lbs earlier in the year  DMII: doing better with vaginal irritation off [place]ard[place] CBGS range 116-126 Lab Results  Component Value Date   HGBA1C 7.5 04/18/2017  Staff report diabetic ulcer to the right heel, small with no drainage   Past Medical History:  Diagnosis Date  . Abnormality of gait 11/18/2008  . Anxiety   . Basilar artery syndrome   . Closed fracture of intertrochanteric section of femur ([place]) 03/04/2010  . Corns and callosities 02/05/2008  . Depressive disorder   . Diabetes mellitus   . Diarrhea 12/13/2012  . Dysphagia   . Edema 10/25/2007  . Fecal smearing 04/05/2012  . Hyperlipidemia   . Hypertension   . Insomnia   . Macular degeneration (senile) of retina, unspecified 03/17/2011  . Neuropathy   . Osteoporosis, senile   . Other atopic dermatitis and related conditions 12/13/2012  . Pain in joint, ankle and foot 08/07/2008  . Pain in joint, pelvic region and thigh 10/2008  . Pain in limb 11/18/2008  . Paroxysmal atrial tachycardia ([place])   . Peripheral vascular disease, unspecified ([place]) 08/14/2012  . Plantar fascial fibromatosis   . Rash and other nonspecific skin eruption 12/21/2010  . Rickets, active   . Spinal stenosis, unspecified region other than cervical 11/18/2008  . TIA (transient ischemic attack)   . Transient ischemic attack (TIA), and cerebral infarction without residual deficits(V12.54) 10/28/2002  . Type II or unspecified type diabetes mellitus with peripheral circulatory disorders, uncontrolled(250.72) 02/12/2013  . Unspecified hereditary and idiopathic peripheral neuropathy 03/17/2011  . Unspecified urinary incontinence 05/13/2008  . Venous  insufficiency   . Vitamin D deficiency 11/07/2007   Past Surgical History:  Procedure Laterality Date  . BREAST MASS EXCISION    . CHOLECYSTECTOMY  1964  . COLONOSCOPY  12/18/2010   Dr Oletta Lamas  . DILATION AND CURETTAGE OF  UTERUS  2003   post menopausal bleeding. Bicornate uterus/doble cervix, benign path  . ESOPHAGOGASTRODUODENOSCOPY (EGD) WITH PROPOFOL N/A 11/07/2015   Procedure: ESOPHAGOGASTRODUODENOSCOPY (EGD) WITH PROPOFOL;  Surgeon: Carol Ada, MD;  Location: [facility] ENDOSCOPY;  Service: Endoscopy;  Laterality: N/A;  . ESOPHAGOGASTRODUODENOSCOPY ENDOSCOPY  12/18/2010   Dr. Oletta Lamas slight gastritis  . HIP FRACTURE SURGERY Right 02/2010  . INCISION / DRAINAGE HAND / FINGER  2001   cat bite  . TONSILLECTOMY AND ADENOIDECTOMY     as a child    Allergies  Allergen Reactions  . Codeine     UNKNOWN  . Tramadol     UNKNOWN  . Vesicare [Solifenacin Succinate]     UNKNOWN    Outpatient Encounter Prescriptions as of 07/18/2017  Medication Sig  . alendronate (FOSAMAX) 70 MG tablet Take 70 mg by mouth once a week. Take with a full glass of water on an empty stomach.  Marland Kitchen atenolol (TENORMIN) 25 MG tablet Take 12.5 mg by mouth every morning.   . calcium-vitamin D (OSCAL WITH D) 500-200 MG-UNIT per tablet Take 1 tablet by mouth every morning.   . clobetasol cream (TEMOVATE) 0.05 % To bilateral legs from the Knee down every day before compression stockings applied Monday thur Friday  . clotrimazole-betamethasone (LOTRISONE) cream Apply 1 application topically 2 (two) times daily.  . Emollient (EUCERIN) lotion Apply 1 mL topically as needed for dry skin.  . fluorouracil (EFUDEX) 5 % cream Apply 1 application topically 2 (two) times daily.  Marland Kitchen gabapentin (NEURONTIN) 100 MG capsule Take 100 mg by mouth 3 (three) times daily.   Marland Kitchen linagliptin (TRADJENTA) 5 MG TABS tablet Take 5 mg by mouth daily. Every evening  . Liraglutide (VICTOZA [place]) Inject 1.2 mg into the skin daily.   . memantine (NAMENDA) 10 MG tablet Take 10 mg by mouth 2 (two) times daily.  Marland Kitchen nystatin cream (MYCOSTATIN) Apply 1 application topically 2 (two) times daily.  . pantoprazole (PROTONIX) 40 MG tablet Take 1 tablet (40 mg total) by mouth 2 (two) times daily  before a meal.  . saccharomyces boulardii (FLORASTOR) 250 MG capsule Take 250 mg by mouth 2 (two) times daily.  . sertraline (ZOLOFT) 50 MG tablet Take 50 mg by mouth daily.  Marland Kitchen spironolactone (ALDACTONE) 50 MG tablet Take 50 mg by mouth daily.   . vitamin B-12 (CYANOCOBALAMIN) 1000 MCG tablet Take 1,000 mcg by mouth every morning. Take one tablet daily   No facility-administered encounter medications on file as of 07/18/2017.     Review of Systems  Constitutional: Positive for appetite change and unexpected weight change. Negative for activity change, chills, diaphoresis, fatigue and fever.  HENT: Negative for congestion.   Respiratory: Negative for cough, shortness of breath and wheezing.   Cardiovascular: Positive for leg swelling. Negative for chest pain and palpitations.  Gastrointestinal: Negative for abdominal distention, abdominal pain, constipation and diarrhea.  Genitourinary: Negative for difficulty urinating and dysuria.  Musculoskeletal: Positive for gait problem. Negative for arthralgias, back pain, joint swelling and myalgias.  Skin: Positive for rash and wound.  Neurological: Negative for dizziness, tremors, seizures, syncope, facial asymmetry, speech difficulty, weakness, light-headedness, numbness and headaches.  Psychiatric/Behavioral: Positive for agitation and confusion. Negative for behavioral problems, self-injury  and sleep disturbance. The patient is not nervous/anxious.     Immunization History  Administered Date(s) Administered  . Influenza Inj Mdck Quad Pf 08/05/2016  . Influenza Whole 08/01/2013  . Influenza-Unspecified 08/01/2014, 07/31/2015, 08/05/2016  . PPD Test 03/09/2012  . Pneumococcal Conjugate-13 03/04/2015  . Pneumococcal Polysaccharide-23 10/18/1998  . Td 11/04/2011   Pertinent  Health Maintenance Due  Topic Date Due  . OPHTHALMOLOGY EXAM  04/23/2017  . INFLUENZA VACCINE  05/18/2017  . HEMOGLOBIN A1C  10/19/2017  . FOOT EXAM  04/14/2018  .  URINE MICROALBUMIN  04/18/2018  . DEXA SCAN  Completed  . PNA vac Low Risk Adult  Completed   Fall Risk  03/10/2016 09/10/2015 09/02/2014 08/29/2013  Falls in the past year? No No No No  Risk for fall due to : - History of fall(s);Impaired balance/gait;Impaired mobility;Mental status change - -   Functional Status Survey:    Vitals:   07/22/17 1211  BP: (!) 99/56  Pulse: 60  Resp: 16  Temp: 98.7 F (37.1 C)  SpO2: 91%  Weight: 157 lb 8 oz (71.4 kg)   Body mass index is 24.67 kg/m. Physical Exam  Constitutional: No distress.  HENT:  Head: Normocephalic and atraumatic.  Mouth/Throat: No oropharyngeal exudate.  Neck: No JVD present.  Cardiovascular: Normal rate and regular rhythm.   No murmur heard. Pulmonary/Chest: Effort normal and breath sounds normal. No respiratory distress. She has no wheezes.  Neurological: She is alert.  Oriented to self and place ,not time. Able to f/c  Skin: Skin is warm and dry. Rash (erythema to bilater buttocks w/dry scaly skin. BLE edema +1 with erythema. Right heel with small diabetic foot ulcer 100% yellow base, no drainage, no pain) noted. She is not diaphoretic.  Psychiatric:  flat    Labs reviewed:  Recent Labs  11/08/16 02/08/17 0300 04/18/17  NA 139 139 135*  K 5.0 5.0 5.2  BUN 20 20 22*  CREATININE 1.1 1.1 1.3*    Recent Labs  04/18/17  AST 15  ALT 11  ALKPHOS 90    Recent Labs  02/08/17 0300 04/18/17  WBC 14.9 12.5  HGB 18.5* 17.8*  HCT 60* 57*  PLT 375 375   Lab Results  Component Value Date   TSH 1.69 08/27/2015   Lab Results  Component Value Date   HGBA1C 7.5 04/18/2017   Lab Results  Component Value Date   CHOL 173 08/27/2015   HDL 38 08/27/2015   LDLCALC 102 08/27/2015   TRIG 163 (A) 08/27/2015    Significant Diagnostic Results in last 30 days:  No results found.  Assessment/Plan  1. Weight loss Most likely due to advancing dementia and possible depression Shows signs of apathy and lack of  hygiene which will not likely respond to change in medication but we can certainly try. I will order a TSH to rule out hyperthyroid and then consider medication adjustment or change Nutritional consult  2. Depression, recurrent ([place]) Currently on zoloft, considering a change (see above)  3. Type 2 diabetes mellitus with other circulatory complication, without long-term current use of insulin (HCC) Controlled with goal A1 C <8% Continue Victoza and tradjenta  4. Late onset Alzheimer's disease with behavioral disturbance Progressive memory loss and lack of hygiene Refuses personal care regularly and apts for eye exam, dexa exam Continue to ambulate and may still benefit from Namenda  5. CKD (chronic kidney disease), stage III (HCC) Continue to periodically monitor BMP and avoid nephrotoxic agents  6.  Diabetic ulcer of right heel associated with type 2 diabetes mellitus, limited to breakdown of skin ([place]) Very small, wound care nurse to monitor Cover with dry dressing   Family/ staff Communication: discussed with staff  Labs/tests ordered:  TSH

## 2017-07-19 DIAGNOSIS — E039 Hypothyroidism, unspecified: Secondary | ICD-10-CM | POA: Diagnosis not present

## 2017-07-19 DIAGNOSIS — R946 Abnormal results of thyroid function studies: Secondary | ICD-10-CM | POA: Diagnosis not present

## 2017-07-19 LAB — TSH: TSH: 3.3 (ref <=5.90)

## 2017-07-22 ENCOUNTER — Encounter: Payer: Self-pay | Admitting: Adult Health

## 2017-08-09 DIAGNOSIS — F339 Major depressive disorder, recurrent, unspecified: Secondary | ICD-10-CM | POA: Diagnosis not present

## 2017-08-16 DIAGNOSIS — D649 Anemia, unspecified: Secondary | ICD-10-CM | POA: Diagnosis not present

## 2017-08-16 DIAGNOSIS — R197 Diarrhea, unspecified: Secondary | ICD-10-CM | POA: Diagnosis not present

## 2017-08-16 LAB — CBC AND DIFFERENTIAL
HEMATOCRIT: 50 — AB (ref 36–46)
HEMOGLOBIN: 16.3 — AB (ref 12.0–16.0)
PLATELETS: 424 — AB (ref 150–399)
WBC: 11.3

## 2017-08-16 LAB — BASIC METABOLIC PANEL
BUN: 12 (ref 4–21)
CREATININE: 0.9 (ref 0.5–1.1)
GLUCOSE: 105
POTASSIUM: 4.5 (ref 3.4–5.3)
SODIUM: 137 (ref 137–147)

## 2017-08-18 ENCOUNTER — Non-Acute Institutional Stay (SKILLED_NURSING_FACILITY): Payer: Medicare Other | Admitting: Adult Health

## 2017-08-18 DIAGNOSIS — F0281 Dementia in other diseases classified elsewhere with behavioral disturbance: Secondary | ICD-10-CM

## 2017-08-18 DIAGNOSIS — G301 Alzheimer's disease with late onset: Secondary | ICD-10-CM | POA: Diagnosis not present

## 2017-08-18 DIAGNOSIS — K580 Irritable bowel syndrome with diarrhea: Secondary | ICD-10-CM | POA: Diagnosis not present

## 2017-08-18 DIAGNOSIS — F02818 Dementia in other diseases classified elsewhere, unspecified severity, with other behavioral disturbance: Secondary | ICD-10-CM

## 2017-08-18 DIAGNOSIS — E1159 Type 2 diabetes mellitus with other circulatory complications: Secondary | ICD-10-CM | POA: Diagnosis not present

## 2017-08-18 DIAGNOSIS — F324 Major depressive disorder, single episode, in partial remission: Secondary | ICD-10-CM

## 2017-08-18 DIAGNOSIS — E538 Deficiency of other specified B group vitamins: Secondary | ICD-10-CM | POA: Diagnosis not present

## 2017-08-18 DIAGNOSIS — I872 Venous insufficiency (chronic) (peripheral): Secondary | ICD-10-CM | POA: Diagnosis not present

## 2017-08-18 DIAGNOSIS — I10 Essential (primary) hypertension: Secondary | ICD-10-CM | POA: Diagnosis not present

## 2017-08-18 NOTE — Progress Notes (Signed)
Location:  Occupational psychologist of Service:  SNF (31) Provider:  Cindi Carbon, ANP [place] [phone]   Gayland Curry, DO  Patient Care Team: Gayland Curry, DO as PCP - General (Geriatric Medicine) Community, Well Orpah Greek, MD as Consulting Physician (Gastroenterology) Lorretta Harp, MD as Consulting Physician (Cardiology) Garvin Fila, MD as Consulting Physician (Neurology) Bjorn Loser, MD as Consulting Physician (Urology) Pedro Earls, MD as Attending Physician [facility] Medicine)  Extended Emergency Contact Information Primary Emergency Contact: [place] Phone: [phone] Relation: Friend Secondary Emergency Contact: [facility]  [place] Phone: [phone] Relation: Other  Code Status:  DNR Goals of care: Advanced Directive information Advanced Directives 08/19/2017  Does Patient Have a Medical Advance Directive? Yes  Type of Advance Directive Out of facility DNR (pink MOST or yellow form);Living will;Healthcare Power of Attorney  Does patient want to make changes to medical advance directive? -  Copy of [place] in Chart? Yes  Pre-existing out of facility DNR order (yellow form or pink MOST form) Yellow form placed in chart (order not valid for inpatient use)     Chief Complaint  Patient presents with  . Medical Management of Chronic Issues    HPI:  Pt is a 83 y.o. female seen today for medical management of chronic diseases.  She resides in skilled care.  The staff report that had some diarrhea this week which resolved. VS have been stable. She has a poor appetite that is a chronic issue due to her dementia. Labs indicated no acute problems.   Memory loss:  03/01/17: MMSE 25/30, currently namenda  Depression: Seen by Dr. Casimiro Needle and zoloft was increased to 150 mg due to lack of appetite,  apathy, social isolation s/s of depression.   Weight loss: poor appetite Wt Readings from Last 3 Encounters:  08/19/17 148 lb (67.1 kg)  07/22/17 157 lb 8 oz (71.4 kg)  06/21/17 157 lb (71.2 kg)    DMII: refused eye exam apt.   CBGS trending down this week 103-115 Lab Results  Component Value Date   HGBA1C 7.5 04/18/2017   Diabetic foot ulcer remains unchanged (small to the right heel with dressing changed qd per staff) Resident report occassional right foot pain. She was taken off neurontin by Dr. Casimiro Needle   Stasis dermatitis: uses clobetasol cream Mon-Dri as well as Eucerin cream which seems to help. Not compliant with compression.  Bp controlled Past Medical History:  Diagnosis Date  . Abnormality of gait 11/18/2008  . Anxiety   . Basilar artery syndrome   . Closed fracture of intertrochanteric section of femur ([place]) 03/04/2010  . Corns and callosities 02/05/2008  . Depressive disorder   . Diabetes mellitus   . Diarrhea 12/13/2012  . Dysphagia   . Edema 10/25/2007  . Fecal smearing 04/05/2012  . Hyperlipidemia   . Hypertension   . Insomnia   . Macular degeneration (senile) of retina, unspecified 03/17/2011  . Neuropathy   . Osteoporosis, senile   . Other atopic dermatitis and related conditions 12/13/2012  . Pain in joint, ankle and foot 08/07/2008  . Pain in joint, pelvic region and thigh 10/2008  . Pain in limb 11/18/2008  . Paroxysmal atrial tachycardia ([place])   . Peripheral vascular disease, unspecified ([place]) 08/14/2012  . Plantar fascial fibromatosis   . Rash and other nonspecific skin eruption 12/21/2010  . Rickets, active   .  Spinal stenosis, unspecified region other than cervical 11/18/2008  . TIA (transient ischemic attack)   . Transient ischemic attack (TIA), and cerebral infarction without residual deficits(V12.54) 10/28/2002  . Type II or unspecified type diabetes mellitus with peripheral circulatory disorders, uncontrolled(250.72) 02/12/2013  . Unspecified hereditary  and idiopathic peripheral neuropathy 03/17/2011  . Unspecified urinary incontinence 05/13/2008  . Venous insufficiency   . Vitamin D deficiency 11/07/2007   Past Surgical History:  Procedure Laterality Date  . BREAST MASS EXCISION    . CHOLECYSTECTOMY  1964  . COLONOSCOPY  12/18/2010   Dr Oletta Lamas  . DILATION AND CURETTAGE OF UTERUS  2003   post menopausal bleeding. Bicornate uterus/doble cervix, benign path  . ESOPHAGOGASTRODUODENOSCOPY (EGD) WITH PROPOFOL N/A 11/07/2015   Procedure: ESOPHAGOGASTRODUODENOSCOPY (EGD) WITH PROPOFOL;  Surgeon: Carol Ada, MD;  Location: [facility] END[facility];  Service: Endoscopy;  Laterality: N/A;  . ESOPHAGOGASTRODUODENOSCOPY ENDOSCOPY  12/18/2010   Dr. Oletta Lamas slight gastritis  . HIP FRACTURE SURGERY Right 02/2010  . INCISION / DRAINAGE HAND / FINGER  2001   cat bite  . TONSILLECTOMY AND ADENOIDECTOMY     as a child    Allergies  Allergen Reactions  . Codeine     UNKNOWN  . Tramadol     UNKNOWN  . Vesicare [Solifenacin Succinate]     UNKNOWN    Outpatient Encounter Prescriptions as of 08/18/2017  Medication Sig  . alendronate (FOSAMAX) 70 MG tablet Take 70 mg by mouth once a week. Take with a full glass of water on an empty stomach.  Marland Kitchen atenolol (TENORMIN) 25 MG tablet Take 12.5 mg by mouth every morning.   . calcium-vitamin D (OSCAL WITH D) 500-200 MG-UNIT per tablet Take 1 tablet by mouth every morning.   . clobetasol cream (TEMOVATE) 8.24 % 1 application daily. To bilateral legs from the Knee down every day before compression stockings applied Monday thur Friday  . Emollient (EUCERIN) lotion Apply 1 mL topically as needed for dry skin.  . Liraglutide (VICTOZA [place]) Inject 1.2 mg into the skin daily.   . memantine (NAMENDA) 10 MG tablet Take 10 mg by mouth 2 (two) times daily.  Marland Kitchen nystatin cream (MYCOSTATIN) Apply 1 application topically 2 (two) times daily as needed.   . pantoprazole (PROTONIX) 40 MG tablet Take 1 tablet (40 mg total) by mouth 2 (two) times  daily before a meal.  . saccharomyces boulardii (FLORASTOR) 250 MG capsule Take 250 mg by mouth 2 (two) times daily.  . sertraline (ZOLOFT) 50 MG tablet Take 150 mg by mouth daily.   Marland Kitchen spironolactone (ALDACTONE) 50 MG tablet Take 50 mg by mouth daily.   . vitamin B-12 (CYANOCOBALAMIN) 1000 MCG tablet Take 1,000 mcg by mouth every morning. Take one tablet daily  . [DISCONTINUED] clotrimazole-betamethasone (LOTRISONE) cream Apply 1 application topically 2 (two) times daily.  Marland Kitchen linagliptin (TRADJENTA) 5 MG TABS tablet Take 5 mg by mouth daily. Every evening  . [DISCONTINUED] fluorouracil (EFUDEX) 5 % cream Apply 1 application topically 2 (two) times daily.  . [DISCONTINUED] gabapentin (NEURONTIN) 100 MG capsule Take 100 mg by mouth 3 (three) times daily.    No facility-administered encounter medications on file as of 08/18/2017.     Review of Systems  Constitutional: Positive for appetite change and unexpected weight change. Negative for activity change, chills, diaphoresis, fatigue and fever.  HENT: Negative for congestion.   Respiratory: Negative for cough, shortness of breath and wheezing.   Cardiovascular: Positive for leg swelling. Negative for chest pain and  palpitations.  Gastrointestinal: Negative for abdominal distention, abdominal pain, constipation and diarrhea.  Genitourinary: Negative for difficulty urinating and dysuria.  Musculoskeletal: Positive for gait problem. Negative for arthralgias, back pain, joint swelling and myalgias.  Skin: Positive for rash and wound.  Neurological: Negative for dizziness, tremors, seizures, syncope, facial asymmetry, speech difficulty, weakness, light-headedness, numbness and headaches.  Psychiatric/Behavioral: Positive for agitation and confusion. Negative for behavioral problems, self-injury and sleep disturbance. The patient is not nervous/anxious.     Immunization History  Administered Date(s) Administered  . Influenza Inj Mdck Quad Pf  08/05/2016  . Influenza Whole 08/01/2013  . Influenza-Unspecified 08/01/2014, 07/31/2015, 08/05/2016  . PPD Test 03/09/2012  . Pneumococcal Conjugate-13 03/04/2015  . Pneumococcal Polysaccharide-23 10/18/1998  . Td 11/04/2011   Pertinent  Health Maintenance Due  Topic Date Due  . OPHTHALMOLOGY EXAM  04/23/2017  . INFLUENZA VACCINE  05/18/2017  . HEMOGLOBIN A1C  10/19/2017  . FOOT EXAM  04/14/2018  . URINE MICROALBUMIN  04/18/2018  . DEXA SCAN  Completed  . PNA vac Low Risk Adult  Completed   Fall Risk  03/10/2016 09/10/2015 09/02/2014 08/29/2013  Falls in the past year? No No No No  Risk for fall due to : - History of fall(s);Impaired balance/gait;Impaired mobility;Mental status change - -   Functional Status Survey:    Vitals:   08/19/17 1333  BP: 110/88  Pulse: 63  Resp: 18  Temp: 99 F (37.2 C)  SpO2: 92%  Weight: 148 lb (67.1 kg)   Body mass index is 23.18 kg/m.  Physical Exam  Constitutional: No distress.  HENT:  Head: Normocephalic and atraumatic.  Mouth/Throat: No oropharyngeal exudate.  Neck: No JVD present.  Cardiovascular: Normal rate and regular rhythm.   No murmur heard. BLE edema _+1   Pulmonary/Chest: Effort normal and breath sounds normal. No respiratory distress. She has no wheezes.  Abdominal: Soft. Bowel sounds are normal.  rotund  Musculoskeletal: She exhibits no edema or tenderness.  Neurological: She is alert.  Oriented to self and place, not time.  Skin: Skin is warm and dry. She is not diaphoretic. There is erythema (BLE).  Right heel ulceration unchanged with 100% yellow tissue with scant drainage  Psychiatric:  flat     Labs reviewed:  Recent Labs  02/08/17 0300 04/18/17 08/16/17  NA 139 135* 137  K 5.0 5.2 4.5  BUN 20 22* 12  CREATININE 1.1 1.3* 0.9    Recent Labs  04/18/17  AST 15  ALT 11  ALKPHOS 90    Recent Labs  02/08/17 0300 04/18/17 08/16/17  WBC 14.9 12.5 11.3  HGB 18.5* 17.8* 16.3*  HCT 60* 57* 50*    PLT 375 375 424*   Lab Results  Component Value Date   TSH 3.30 07/19/2017   Lab Results  Component Value Date   HGBA1C 7.5 04/18/2017   Lab Results  Component Value Date   CHOL 173 08/27/2015   HDL 38 08/27/2015   LDLCALC 102 08/27/2015   TRIG 163 (A) 08/27/2015    Significant Diagnostic Results in last 30 days:  No results found.  Assessment/Plan   Type 2 diabetes mellitus with circulatory disorder, without long-term current use of insulin (HCC) Due to reduced cbgs due to decreased intake will d/c victoza.   Continue A1C q 6 mo Continue Tradjenta  Irritable bowel syndrome Not currently on medication for this issue. Has loose stools earlier this week without pain which resolved. Continue Florastor.  Dementia with behavioral disturbance Progressive cognitive  and functional losses. Seems more apathetic and withdrawn. Lacks hygiene.  Stasis dermatitis Stable with chronic erythema to BLE> Continue Cloebetasol cream qd Mon-Fri.  Continue compression hose.   Depression Continue zoloft 150 mg qd.  Followed by psych  B12 deficiency Continue B12 1038mcg qd  Essential hypertension, benign Controlled, continue atenolol 12.5 mg qd.  Reduce aldactone to 25 mg qd due to decreasing weight and intake.    Family/ staff Communication: discussed with staff/resident  Labs/tests ordered:  NA

## 2017-08-19 ENCOUNTER — Encounter: Payer: Self-pay | Admitting: Adult Health

## 2017-08-19 NOTE — Assessment & Plan Note (Addendum)
Not currently on medication for this issue. Has loose stools earlier this week without pain which resolved. Continue Florastor.

## 2017-08-19 NOTE — Assessment & Plan Note (Signed)
Due to reduced cbgs due to decreased intake will d/c victoza.   Continue A1C q 6 mo Continue Tradjenta

## 2017-08-19 NOTE — Assessment & Plan Note (Signed)
Continue zoloft 150 mg qd.  Followed by psych

## 2017-08-19 NOTE — Assessment & Plan Note (Signed)
Progressive cognitive and functional losses. Seems more apathetic and withdrawn. Lacks hygiene.

## 2017-08-19 NOTE — Assessment & Plan Note (Signed)
Continue B12 1070mcg qd

## 2017-08-19 NOTE — Assessment & Plan Note (Signed)
Stable with chronic erythema to BLE> Continue Cloebetasol cream qd Mon-Fri.  Continue compression hose.

## 2017-08-19 NOTE — Assessment & Plan Note (Signed)
Controlled, continue atenolol 12.5 mg qd.  Reduce aldactone to 25 mg qd due to decreasing weight and intake.

## 2017-09-19 ENCOUNTER — Non-Acute Institutional Stay (SKILLED_NURSING_FACILITY): Payer: Medicare Other | Admitting: Adult Health

## 2017-09-19 ENCOUNTER — Encounter: Payer: Self-pay | Admitting: Adult Health

## 2017-09-19 DIAGNOSIS — F02818 Dementia in other diseases classified elsewhere, unspecified severity, with other behavioral disturbance: Secondary | ICD-10-CM

## 2017-09-19 DIAGNOSIS — R531 Weakness: Secondary | ICD-10-CM

## 2017-09-19 DIAGNOSIS — E1159 Type 2 diabetes mellitus with other circulatory complications: Secondary | ICD-10-CM | POA: Diagnosis not present

## 2017-09-19 DIAGNOSIS — E538 Deficiency of other specified B group vitamins: Secondary | ICD-10-CM | POA: Diagnosis not present

## 2017-09-19 DIAGNOSIS — R634 Abnormal weight loss: Secondary | ICD-10-CM | POA: Insufficient documentation

## 2017-09-19 DIAGNOSIS — D72829 Elevated white blood cell count, unspecified: Secondary | ICD-10-CM | POA: Diagnosis not present

## 2017-09-19 DIAGNOSIS — G301 Alzheimer's disease with late onset: Secondary | ICD-10-CM | POA: Diagnosis not present

## 2017-09-19 DIAGNOSIS — J189 Pneumonia, unspecified organism: Secondary | ICD-10-CM | POA: Diagnosis not present

## 2017-09-19 DIAGNOSIS — F0281 Dementia in other diseases classified elsewhere with behavioral disturbance: Secondary | ICD-10-CM | POA: Diagnosis not present

## 2017-09-19 DIAGNOSIS — R109 Unspecified abdominal pain: Secondary | ICD-10-CM | POA: Diagnosis not present

## 2017-09-19 DIAGNOSIS — R319 Hematuria, unspecified: Secondary | ICD-10-CM | POA: Diagnosis not present

## 2017-09-19 NOTE — Progress Notes (Addendum)
Location:  Occupational psychologist of Service:  SNF (31) Provider:   Cindi Carbon, ANP [place] [phone]   Gayland Curry, DO  Patient Care Team: Gayland Curry, DO as PCP - General (Geriatric Medicine) Community, Well Orpah Greek, MD as Consulting Physician (Gastroenterology) Lorretta Harp, MD as Consulting Physician (Cardiology) Garvin Fila, MD as Consulting Physician (Neurology) Bjorn Loser, MD as Consulting Physician (Urology) Pedro Earls, MD as Attending Physician [facility] Medicine)  Extended Emergency Contact Information Primary Emergency Contact: [place] Phone: [phone] Relation: Friend Secondary Emergency Contact: [facility]  [place] Phone: [phone] Relation: Other  Code Status:  DNR Goals of care: Advanced Directive information Advanced Directives 08/19/2017  Does Patient Have a Medical Advance Directive? Yes  Type of Advance Directive Out of facility DNR (pink MOST or yellow form);Living will;Healthcare Power of Attorney  Does patient want to make changes to medical advance directive? -  Copy of [place] in Chart? Yes  Pre-existing out of facility DNR order (yellow form or pink MOST form) Yellow form placed in chart (order not valid for inpatient use)     Chief Complaint  Patient presents with  . Acute Visit    increased weakness    HPI:  Pt is a 83 y.o. female seen for increased weakness for the past two weeks. She had shown signs of progressive dementia with apathy for the past several months. She has also been losing weight and had a poor appetite. She does not like nutritional supplements.   Wt Readings from Last 3 Encounters:  09/19/17 149 lb 9.6 oz (67.9 kg)  08/19/17 148 lb (67.1 kg)  07/22/17 157 lb 8 oz (71.4 kg)  Dr Casimiro Needle was consulted with psych and zoloft was increased to  150 mg on 10/23.   There are no reports of fever or cough. No dysuria, abd pain, nausea, vomiting, etc. She is requiring a stand up lift for transfers due to her weakness the past two weeks. She is less verbal and neglects personal hygiene.  Vs have been normal except one recorded sat of 89%, other are in the low 90's.   WBC has been running slightly high, as well as the hgb 16.3, WBC 11.3 08/16/17 Peripheral smear showed erythrocytosis, mld neutrophilia with monocytosis, adequate platelets 05/17/17 She is sleeping normally, no verbalizations of self harm.   Due to weakness she is leaning to the left in her chair which is causing some neck pain. No change in speech, movements are equal. CBGS range 118-125 Past Medical History:  Diagnosis Date  . Abnormality of gait 11/18/2008  . Anxiety   . Basilar artery syndrome   . Closed fracture of intertrochanteric section of femur ([place]) 03/04/2010  . Corns and callosities 02/05/2008  . Depressive disorder   . Diabetes mellitus   . Diarrhea 12/13/2012  . Dysphagia   . Edema 10/25/2007  . Fecal smearing 04/05/2012  . Hyperlipidemia   . Hypertension   . Insomnia   . Macular degeneration (senile) of retina, unspecified 03/17/2011  . Neuropathy   . Osteoporosis, senile   . Other atopic dermatitis and related conditions 12/13/2012  . Pain in joint, ankle and foot 08/07/2008  . Pain in joint, pelvic region and thigh 10/2008  . Pain in limb 11/18/2008  . Paroxysmal atrial tachycardia ([place])   . Peripheral vascular disease, unspecified ([place]) 08/14/2012  . Plantar fascial fibromatosis   .  Rash and other nonspecific skin eruption 12/21/2010  . Rickets, active   . Spinal stenosis, unspecified region other than cervical 11/18/2008  . TIA (transient ischemic attack)   . Transient ischemic attack (TIA), and cerebral infarction without residual deficits(V12.54) 10/28/2002  . Type II or unspecified type diabetes mellitus with peripheral circulatory disorders,  uncontrolled(250.72) 02/12/2013  . Unspecified hereditary and idiopathic peripheral neuropathy 03/17/2011  . Unspecified urinary incontinence 05/13/2008  . Venous insufficiency   . Vitamin D deficiency 11/07/2007   Past Surgical History:  Procedure Laterality Date  . BREAST MASS EXCISION    . CHOLECYSTECTOMY  1964  . COLONOSCOPY  12/18/2010   Dr Oletta Lamas  . DILATION AND CURETTAGE OF UTERUS  2003   post menopausal bleeding. Bicornate uterus/doble cervix, benign path  . ESOPHAGOGASTRODUODENOSCOPY (EGD) WITH PROPOFOL N/A 11/07/2015   Procedure: ESOPHAGOGASTRODUODENOSCOPY (EGD) WITH PROPOFOL;  Surgeon: Carol Ada, MD;  Location: [facility];  Service: Endoscopy;  Laterality: N/A;  . ESOPHAGOGASTRODUODENOSCOPY ENDOSCOPY  12/18/2010   Dr. Oletta Lamas slight gastritis  . HIP FRACTURE SURGERY Right 02/2010  . INCISION / DRAINAGE HAND / FINGER  2001   cat bite  . TONSILLECTOMY AND ADENOIDECTOMY     as a child    Allergies  Allergen Reactions  . Codeine     UNKNOWN  . Tramadol     UNKNOWN  . Vesicare [Solifenacin Succinate]     UNKNOWN    Outpatient Encounter Medications as of 09/19/2017  Medication Sig  . alendronate (FOSAMAX) 70 MG tablet Take 70 mg by mouth once a week. Take with a full glass of water on an empty stomach.  Marland Kitchen atenolol (TENORMIN) 25 MG tablet Take 12.5 mg by mouth every morning.   . clobetasol cream (TEMOVATE) 1.02 % 1 application daily. To bilateral legs from the Knee down every day before compression stockings applied Monday thur Friday  . Emollient (EUCERIN) lotion Apply 1 mL topically as needed for dry skin.  Marland Kitchen linagliptin (TRADJENTA) 5 MG TABS tablet Take 5 mg by mouth daily. Every evening  . memantine (NAMENDA) 10 MG tablet Take 10 mg by mouth 2 (two) times daily.  Marland Kitchen nystatin cream (MYCOSTATIN) Apply 1 application topically 2 (two) times daily as needed.   . pantoprazole (PROTONIX) 40 MG tablet Take 1 tablet (40 mg total) by mouth 2 (two) times daily before a meal.  .  saccharomyces boulardii (FLORASTOR) 250 MG capsule Take 250 mg by mouth 2 (two) times daily.  . sertraline (ZOLOFT) 50 MG tablet Take 150 mg by mouth daily.   . vitamin B-12 (CYANOCOBALAMIN) 1000 MCG tablet Take 1,000 mcg by mouth every morning. Take one tablet daily  . [DISCONTINUED] calcium-vitamin D (OSCAL WITH D) 500-200 MG-UNIT per tablet Take 1 tablet by mouth every morning.   . [DISCONTINUED] spironolactone (ALDACTONE) 50 MG tablet Take 25 mg by mouth daily.    No facility-administered encounter medications on file as of 09/19/2017.     Review of Systems  Constitutional: Positive for malaise/fatigue and weight loss. Negative for chills, diaphoresis and fever.  HENT: Negative for ear discharge, ear pain, hearing loss and sore throat.   Respiratory: Negative for cough, hemoptysis, sputum production and wheezing.   Cardiovascular: Negative for chest pain, leg swelling and PND.  Gastrointestinal: Negative for abdominal pain, constipation, diarrhea, nausea and vomiting.  Genitourinary: Negative for dysuria, flank pain, frequency, hematuria and urgency.  Musculoskeletal: Positive for joint pain and neck pain. Negative for back pain, falls and myalgias.  Skin: Negative for itching  and rash.  Neurological: Positive for weakness. Negative for dizziness, tingling, tremors, sensory change, focal weakness, seizures, loss of consciousness and headaches.  Psychiatric/Behavioral: Positive for depression and memory loss. Negative for hallucinations, substance abuse and suicidal ideas. The patient is not nervous/anxious and does not have insomnia.     Immunization History  Administered Date(s) Administered  . Influenza Inj Mdck Quad Pf 08/05/2016  . Influenza Whole 08/01/2013  . Influenza-Unspecified 08/01/2014, 07/31/2015, 08/05/2016  . PPD Test 03/09/2012  . Pneumococcal Conjugate-13 03/04/2015  . Pneumococcal Polysaccharide-23 10/18/1998  . Td 11/04/2011   Pertinent  Health Maintenance Due    Topic Date Due  . OPHTHALMOLOGY EXAM  04/23/2017  . INFLUENZA VACCINE  05/18/2017  . HEMOGLOBIN A1C  10/19/2017  . FOOT EXAM  04/14/2018  . URINE MICROALBUMIN  04/18/2018  . DEXA SCAN  Completed  . PNA vac Low Risk Adult  Completed   Fall Risk  03/10/2016 09/10/2015 09/02/2014 08/29/2013  Falls in the past year? No No No No  Risk for fall due to : - History of fall(s);Impaired balance/gait;Impaired mobility;Mental status change - -   Functional Status Survey:    Vitals:   09/19/17 1347  BP: (!) 144/73  Pulse: 72  Resp: 15  Temp: 97.8 F (36.6 C)  SpO2: 92%  Weight: 149 lb 9.6 oz (67.9 kg)   Body mass index is 23.43 kg/m.  Physical Exam  Constitutional: No distress.  HENT:  Head: Normocephalic and atraumatic.  Mouth/Throat: No oropharyngeal exudate.  Neck: No JVD present.  Cardiovascular: Normal rate and regular rhythm.  No murmur heard. BLE edema _+1   Pulmonary/Chest: Effort normal and breath sounds normal. No respiratory distress. She has no wheezes.  Abdominal: Soft. Bowel sounds are normal. She exhibits no distension. There is no tenderness.  rotund  Musculoskeletal: She exhibits no edema or tenderness.  Generally weaker  Neurological: She is alert.  Oriented to self and place, not time.  Skin: Skin is warm and dry. She is not diaphoretic. There is erythema (BLE).  Psychiatric:  flat     Labs reviewed: Recent Labs    02/08/17 0300 04/18/17 08/16/17  NA 139 135* 137  K 5.0 5.2 4.5  BUN 20 22* 12  CREATININE 1.1 1.3* 0.9   Recent Labs    04/18/17  AST 15  ALT 11  ALKPHOS 90   Recent Labs    02/08/17 0300 04/18/17 08/16/17  WBC 14.9 12.5 11.3  HGB 18.5* 17.8* 16.3*  HCT 60* 57* 50*  PLT 375 375 424*   Lab Results  Component Value Date   TSH 3.30 07/19/2017   Lab Results  Component Value Date   HGBA1C 7.5 04/18/2017   Lab Results  Component Value Date   CHOL 173 08/27/2015   HDL 38 08/27/2015   LDLCALC 102 08/27/2015   TRIG 163  (A) 08/27/2015    Significant Diagnostic Results in last 30 days:  No results found.  Assessment/Plan   Weakness Will check labs, CXR, and UA C and S due to increasing weakness and need for the stand up lift. Appears lethargic but VS remain stable and she arouses easily. Some of this seems to be a progression of symptoms already present, but the past two weeks there has been a rapid decline. ? If this is due to the increased zoloft dosing.  If her work up is unrevealing will would try to lower the zoloft dose again.   Leukocytosis F/U CBC ordered.  The WBC does seem  to be trending down recently.  Given her age, dementia and non compliance with medical care aggressive treatment would not be warranted.   B12 deficiency Check B12 level  Dementia with behavioral disturbance Progressive decline in mood with apathy and lack of personal hygiene related to her dementia. Continue zoloft and namenda, see above noted on weakness  Weight loss TSH normal. Most likely due to poor intake, depression, and dementia. Continue to monitor weight and encourage intake. No supplements due to non compliance. Check KUB due to abd size.    Type 2 diabetes mellitus with circulatory disorder, without long-term current use of insulin (HCC) Check A1C. CBGS trending downward due to intake. Consider discontinuing tradjenta if A1C is dropping. Goal <8   Family/ staff Communication: discussed with staff/resident  Labs/tests ordered: CBC CMP B12, A1C CXR, ABD xray, UA C and S

## 2017-09-19 NOTE — Assessment & Plan Note (Addendum)
TSH normal. Most likely due to poor intake, depression, and dementia. Continue to monitor weight and encourage intake. No supplements due to non compliance. Check KUB due to abd size.

## 2017-09-19 NOTE — Assessment & Plan Note (Signed)
Will check labs, CXR, and UA C and S due to increasing weakness and need for the stand up lift. Appears lethargic but VS remain stable and she arouses easily. Some of this seems to be a progression of symptoms already present, but the past two weeks there has been a rapid decline. ? If this is due to the increased zoloft dosing.  If her work up is unrevealing will would try to lower the zoloft dose again.

## 2017-09-19 NOTE — Assessment & Plan Note (Signed)
Progressive decline in mood with apathy and lack of personal hygiene related to her dementia. Continue zoloft and namenda, see above noted on weakness

## 2017-09-19 NOTE — Assessment & Plan Note (Signed)
F/U CBC ordered.  The WBC does seem to be trending down recently.  Given her age, dementia and non compliance with medical care aggressive treatment would not be warranted.

## 2017-09-19 NOTE — Assessment & Plan Note (Signed)
Check B12 level. 

## 2017-09-19 NOTE — Assessment & Plan Note (Signed)
Check A1C. CBGS trending downward due to intake. Consider discontinuing tradjenta if A1C is dropping. Goal <8

## 2017-09-20 DIAGNOSIS — D519 Vitamin B12 deficiency anemia, unspecified: Secondary | ICD-10-CM | POA: Diagnosis not present

## 2017-09-20 DIAGNOSIS — D649 Anemia, unspecified: Secondary | ICD-10-CM | POA: Diagnosis not present

## 2017-09-20 DIAGNOSIS — R531 Weakness: Secondary | ICD-10-CM | POA: Diagnosis not present

## 2017-09-20 DIAGNOSIS — E1143 Type 2 diabetes mellitus with diabetic autonomic (poly)neuropathy: Secondary | ICD-10-CM | POA: Diagnosis not present

## 2017-09-20 LAB — HEPATIC FUNCTION PANEL
ALT: 8 (ref 7–35)
AST: 12 — AB (ref 13–35)
Alkaline Phosphatase: 114 (ref 25–125)
Bilirubin, Total: 0.6

## 2017-09-20 LAB — BASIC METABOLIC PANEL
BUN: 12 (ref 4–21)
Creatinine: 0.8 (ref 0.5–1.1)
Glucose: 141
Potassium: 4.1 (ref 3.4–5.3)
Sodium: 139 (ref 137–147)

## 2017-09-20 LAB — VITAMIN B12: Vitamin B-12: 1641

## 2017-09-20 LAB — CBC AND DIFFERENTIAL
HCT: 50 — AB (ref 36–46)
Hemoglobin: 16 (ref 12.0–16.0)
Platelets: 483 — AB (ref 150–399)
WBC: 14.3

## 2017-09-20 LAB — HEMOGLOBIN A1C: Hemoglobin A1C: 7.5

## 2017-09-21 ENCOUNTER — Encounter: Payer: Self-pay | Admitting: Internal Medicine

## 2017-09-22 ENCOUNTER — Non-Acute Institutional Stay (SKILLED_NURSING_FACILITY): Payer: Medicare Other | Admitting: Adult Health

## 2017-09-22 ENCOUNTER — Encounter: Payer: Self-pay | Admitting: Adult Health

## 2017-09-22 DIAGNOSIS — Z7189 Other specified counseling: Secondary | ICD-10-CM

## 2017-09-22 DIAGNOSIS — R531 Weakness: Secondary | ICD-10-CM

## 2017-09-22 NOTE — Progress Notes (Signed)
Location:  Occupational psychologist of Service:  SNF (31) Provider:   Cindi Carbon, ANP [name], DO  Patient Care Team: Gayland Curry, DO as PCP - General (Geriatric Medicine) Community, Well Orpah Greek, MD as Consulting Physician (Gastroenterology) Lorretta Harp, MD as Consulting Physician (Cardiology) Garvin Fila, MD as Consulting Physician (Neurology) Bjorn Loser, MD as Consulting Physician (Urology) Pedro Earls, MD as Attending Physician [facility] Medicine)  Extended Emergency Contact Information Primary Emergency Contact: [place] of [place] Phone: [phone] Relation: Friend Secondary Emergency Contact: [facility]  [place] Phone: [phone] Relation: Other  Code Status:  DNR Goals of care: Advanced Directive information Advanced Directives 08/19/2017  Does Patient Have a Medical Advance Directive? Yes  Type of Advance Directive Out of facility DNR (pink MOST or yellow form);Living will;Healthcare Power of Attorney  Does patient want to make changes to medical advance directive? -  Copy of [place] in Chart? Yes  Pre-existing out of facility DNR order (yellow form or pink MOST form) Yellow form placed in chart (order not valid for inpatient use)     Chief Complaint  Patient presents with  . Acute Visit    f/u labs, HCPOA     HPI:  Pt is a 83 y.o. female seen today for an acute visit for follow up regarding weakness. For the past two weeks Kara Harris has had increased weakness and required a stand up lift. She has advancing dementia and moved to skilled care in May of 2018. Her work up was unrevealing accepted for a WBC of 14.3 and a Hgb of 16.  Previous peripheral smear was unrevealing.  She has also has had decreased appetite, apathy, lack of personal hygiene and weight loss. Zoloft  was increased to 150 mg and neurontin discontinued in October by psych. No improvements thus far in her mood. The neurontin was started back on 12/4.    Due to her change in condition with increased weakness and a failure to thrive state I met with her family friend Kara Harris. He is her financial POA.  He is going to help establish a new HCPOA in her step grandson, Kara Harris.  CXR 09/19/17: cardiomegaly and pulmonary edema UA: negative for bacteria ABD xray: no acute pathology   Past Medical History:  Diagnosis Date  . Abnormality of gait 11/18/2008  . Anxiety   . Basilar artery syndrome   . Closed fracture of intertrochanteric section of femur ([place]) 03/04/2010  . Corns and callosities 02/05/2008  . Depressive disorder   . Diabetes mellitus   . Diarrhea 12/13/2012  . Dysphagia   . Edema 10/25/2007  . Fecal smearing 04/05/2012  . Hyperlipidemia   . Hypertension   . Insomnia   . Macular degeneration (senile) of retina, unspecified 03/17/2011  . Neuropathy   . Osteoporosis, senile   . Other atopic dermatitis and related conditions 12/13/2012  . Pain in joint, ankle and foot 08/07/2008  . Pain in joint, pelvic region and thigh 10/2008  . Pain in limb 11/18/2008  . Paroxysmal atrial tachycardia ([place])   . Peripheral vascular disease, unspecified ([place]) 08/14/2012  . Plantar fascial fibromatosis   . Rash and other nonspecific skin eruption 12/21/2010  . Rickets, active   . Spinal stenosis, unspecified region other than cervical 11/18/2008  . TIA (transient ischemic attack)   . Transient ischemic attack (TIA), and cerebral infarction  without residual deficits(V12.54) 10/28/2002  . Type II or unspecified type diabetes mellitus with peripheral circulatory disorders, uncontrolled(250.72) 02/12/2013  . Unspecified hereditary and idiopathic peripheral neuropathy 03/17/2011  . Unspecified urinary incontinence 05/13/2008  . Venous insufficiency   . Vitamin D deficiency 11/07/2007   Past Surgical History:  Procedure  Laterality Date  . BREAST MASS EXCISION    . CHOLECYSTECTOMY  1964  . COLONOSCOPY  12/18/2010   Dr Oletta Lamas  . DILATION AND CURETTAGE OF UTERUS  2003   post menopausal bleeding. Bicornate uterus/doble cervix, benign path  . ESOPHAGOGASTRODUODENOSCOPY (EGD) WITH PROPOFOL N/A 11/07/2015   Procedure: ESOPHAGOGASTRODUODENOSCOPY (EGD) WITH PROPOFOL;  Surgeon: Carol Ada, MD;  Location: [facility];  Service: Endoscopy;  Laterality: N/A;  . ESOPHAGOGASTRODUODENOSCOPY ENDOSCOPY  12/18/2010   Dr. Oletta Lamas slight gastritis  . HIP FRACTURE SURGERY Right 02/2010  . INCISION / DRAINAGE HAND / FINGER  2001   cat bite  . TONSILLECTOMY AND ADENOIDECTOMY     as a child    Allergies  Allergen Reactions  . Codeine     UNKNOWN  . Tramadol     UNKNOWN  . Vesicare [Solifenacin Succinate]     UNKNOWN    Outpatient Encounter Medications as of 09/22/2017  Medication Sig  . gabapentin (NEURONTIN) 100 MG capsule Take 100 mg by mouth 2 (two) times daily.  Marland Kitchen alendronate (FOSAMAX) 70 MG tablet Take 70 mg by mouth once a week. Take with a full glass of water on an empty stomach.  Marland Kitchen atenolol (TENORMIN) 25 MG tablet Take 12.5 mg by mouth every morning.   . calcium carbonate (TUMS - DOSED IN MG ELEMENTAL CALCIUM) 500 MG chewable tablet Chew 2 tablets by mouth 2 (two) times daily.  . clobetasol cream (TEMOVATE) 0.08 % 1 application daily. To bilateral legs from the Knee down every day before compression stockings applied Monday thur Friday  . Emollient (EUCERIN) lotion Apply 1 mL topically as needed for dry skin.  Marland Kitchen linagliptin (TRADJENTA) 5 MG TABS tablet Take 5 mg by mouth daily. Every evening  . memantine (NAMENDA) 10 MG tablet Take 10 mg by mouth 2 (two) times daily.  Marland Kitchen nystatin cream (MYCOSTATIN) Apply 1 application topically 2 (two) times daily as needed.   . pantoprazole (PROTONIX) 40 MG tablet Take 1 tablet (40 mg total) by mouth 2 (two) times daily before a meal.  . saccharomyces boulardii (FLORASTOR) 250  MG capsule Take 250 mg by mouth 2 (two) times daily.  . sertraline (ZOLOFT) 50 MG tablet Take 150 mg by mouth daily.   Marland Kitchen spironolactone (ALDACTONE) 25 MG tablet Take 25 mg by mouth daily.  . vitamin B-12 (CYANOCOBALAMIN) 1000 MCG tablet Take 1,000 mcg by mouth every morning. Take one tablet daily   No facility-administered encounter medications on file as of 09/22/2017.     Review of Systems  Constitutional: Positive for activity change, appetite change and fatigue. Negative for chills, diaphoresis, fever and unexpected weight change.  HENT: Negative for congestion, trouble swallowing and voice change.   Respiratory: Negative for cough, shortness of breath and wheezing.   Cardiovascular: Positive for leg swelling. Negative for chest pain and palpitations.  Gastrointestinal: Positive for abdominal distention. Negative for abdominal pain, anal bleeding, blood in stool, constipation, diarrhea and nausea.  Genitourinary: Negative for difficulty urinating and dysuria.  Musculoskeletal: Positive for arthralgias, back pain (left neck hurts from leaning over) and gait problem.  Skin: Positive for rash (chronic to ble).  Neurological: Negative for dizziness, facial asymmetry, speech  difficulty, light-headedness, numbness and headaches.  Psychiatric/Behavioral: Positive for confusion and dysphoric mood. Negative for agitation, behavioral problems, hallucinations, self-injury, sleep disturbance and suicidal ideas. The patient is not nervous/anxious and is not hyperactive.     Immunization History  Administered Date(s) Administered  . Influenza Inj Mdck Quad Pf 08/05/2016  . Influenza Whole 08/01/2013  . Influenza-Unspecified 08/01/2014, 07/31/2015, 08/05/2016  . PPD Test 03/09/2012  . Pneumococcal Conjugate-13 03/04/2015  . Pneumococcal Polysaccharide-23 10/18/1998  . Td 11/04/2011   Pertinent  Health Maintenance Due  Topic Date Due  . OPHTHALMOLOGY EXAM  04/23/2017  . INFLUENZA VACCINE   05/18/2017  . HEMOGLOBIN A1C  03/21/2018  . FOOT EXAM  04/14/2018  . URINE MICROALBUMIN  04/18/2018  . DEXA SCAN  Completed  . PNA vac Low Risk Adult  Completed   Fall Risk  03/10/2016 09/10/2015 09/02/2014 08/29/2013  Falls in the past year? No No No No  Risk for fall due to : - History of fall(s);Impaired balance/gait;Impaired mobility;Mental status change - -   Functional Status Survey:    There were no vitals filed for this visit. There is no height or weight on file to calculate BMI. Physical Exam  Constitutional: She is oriented to person, place, and time. No distress.  HENT:  Head: Normocephalic and atraumatic.  Neck: No JVD present.  Cardiovascular: Normal rate and regular rhythm.  No murmur heard. Pulmonary/Chest: Effort normal and breath sounds normal. No respiratory distress. She has no wheezes.  Neurological: She is alert and oriented to person, place, and time.  Skin: Skin is warm and dry. She is not diaphoretic.  Psychiatric: She has a normal mood and affect.    Labs reviewed: Recent Labs    04/18/17 08/16/17 09/20/17 0300  NA 135* 137 139  K 5.2 4.5 4.1  BUN 22* 12 12  CREATININE 1.3* 0.9 0.8   Recent Labs    04/18/17 09/20/17 0300  AST 15 12*  ALT 11 8  ALKPHOS 90 114   Recent Labs    04/18/17 08/16/17 09/20/17 0300  WBC 12.5 11.3 14.3  HGB 17.8* 16.3* 16.0  HCT 57* 50* 50*  PLT 375 424* 483*   Lab Results  Component Value Date   TSH 3.30 07/19/2017   Lab Results  Component Value Date   HGBA1C 7.5 09/20/2017   Lab Results  Component Value Date   CHOL 173 08/27/2015   HDL 38 08/27/2015   LDLCALC 102 08/27/2015   TRIG 163 (A) 08/27/2015    Significant Diagnostic Results in last 30 days:  No results found.  Assessment/Plan  1) Generalized weakness We have not seen any improvement in her weakness. She is beginning to work with therapy for this issue. There were no obvious reasons noted on her lab work or xrays to indicate the  change. While she does have a chronically elevated WBC and Hgb, this has been the case for quite some time. We ordered another peripheral smear which is pending. The last one was unrevealing in August.  She may be declining due to her advancing dementia, confounded by s/s of depression that are difficult to differentiate from each other. The staff will be contacting Dr. Casimiro Needle regarding her lack of improvement in symptoms.    2) Advanced care planning  I went over her lab findings with her [place] and the resident. Kara Harris does not wish to have aggressive/heroic measures to save her life. She is a DNR and has an advanced directive indicating no artificial  feedings. She does wish to have antibiotics and other treatments for reversible conditions.  She has selected Kara Harris, her step grandson, to be her 50. Kara Harris will remain her financial POA.  We will be contacting Kara Harris to discuss her care further when the peripheral smear returns and we see if there is any further improvement in her care while working with therapy and any med changes that Dr. Casimiro Needle may recommend. The staff will provide Kara Harris with a most form once the [facility] paper is filled out. I have recommended no hospitalizations based on her wishes but we will need to discuss this with Kara Harris in the future.  I spent 30 min with Kara Harris and Kara Harris.   Family/ staff Communication: discussed with Kara Harris her financial POA and Kara Harris Labs/tests ordered:  Peripheral smear pending

## 2017-09-27 ENCOUNTER — Non-Acute Institutional Stay (SKILLED_NURSING_FACILITY): Payer: Medicare Other | Admitting: Internal Medicine

## 2017-09-27 ENCOUNTER — Encounter: Payer: Self-pay | Admitting: Internal Medicine

## 2017-09-27 DIAGNOSIS — M810 Age-related osteoporosis without current pathological fracture: Secondary | ICD-10-CM

## 2017-09-27 DIAGNOSIS — G301 Alzheimer's disease with late onset: Secondary | ICD-10-CM | POA: Diagnosis not present

## 2017-09-27 DIAGNOSIS — R531 Weakness: Secondary | ICD-10-CM | POA: Diagnosis not present

## 2017-09-27 DIAGNOSIS — R627 Adult failure to thrive: Secondary | ICD-10-CM | POA: Diagnosis not present

## 2017-09-27 DIAGNOSIS — I739 Peripheral vascular disease, unspecified: Secondary | ICD-10-CM | POA: Diagnosis not present

## 2017-09-27 DIAGNOSIS — F0281 Dementia in other diseases classified elsewhere with behavioral disturbance: Secondary | ICD-10-CM | POA: Diagnosis not present

## 2017-09-27 DIAGNOSIS — D45 Polycythemia vera: Secondary | ICD-10-CM

## 2017-09-27 DIAGNOSIS — K317 Polyp of stomach and duodenum: Secondary | ICD-10-CM | POA: Diagnosis not present

## 2017-09-27 DIAGNOSIS — E1159 Type 2 diabetes mellitus with other circulatory complications: Secondary | ICD-10-CM | POA: Diagnosis not present

## 2017-09-27 DIAGNOSIS — R1310 Dysphagia, unspecified: Secondary | ICD-10-CM | POA: Diagnosis not present

## 2017-09-27 DIAGNOSIS — Z0000 Encounter for general adult medical examination without abnormal findings: Secondary | ICD-10-CM | POA: Diagnosis not present

## 2017-09-27 DIAGNOSIS — F02818 Dementia in other diseases classified elsewhere, unspecified severity, with other behavioral disturbance: Secondary | ICD-10-CM

## 2017-09-27 NOTE — Progress Notes (Signed)
Patient ID: Kara Harris, female   DOB: 16-Dec-1922, 83 y.o.   MRN: [id number]  Provider:  Rexene Edison. Mariea Clonts, D.O., C.M.D. Location: [place] Room Number: 128 Place of Service:  SNF (31)   PCP: Gayland Curry, DO Patient Care Team: Gayland Curry, DO as PCP - General (Geriatric Medicine) Community, Well Orpah Greek, MD as Consulting Physician (Gastroenterology) Lorretta Harp, MD as Consulting Physician (Cardiology) Garvin Fila, MD as Consulting Physician (Neurology) Bjorn Loser, MD as Consulting Physician (Urology) Pedro Earls, MD as Attending Physician (Family Medicine)  Extended Emergency Contact Information Primary Emergency Contact: Allena Earing [place] of [place] Phone: [phone] Relation: Friend Secondary Emergency Contact: [facility]  [place] Phone: [phone] Relation: Other  Code Status: DNR Goals of Care: Advanced Directive information Advanced Directives 09/27/2017  Does Patient Have a Medical Advance Directive? Yes  Type of Paramedic of [place];Living will;Out of facility DNR (pink MOST or yellow form)  Does patient want to make changes to medical advance directive? No - Patient declined  Copy of [place] in Chart? Yes  Pre-existing out of facility DNR order (yellow form or pink MOST form) Yellow form placed in chart (order not valid for inpatient use)     Chief Complaint  Patient presents with  . Annual Exam    medicare wellness    HPI: Patient is a 83 y.o. female seen today for an annual wellness examination and CPE.  Past Medical History:  Diagnosis Date  . Abnormality of gait 11/18/2008  . Anxiety   . Basilar artery syndrome   . Closed fracture of intertrochanteric section of femur ([place]) 03/04/2010  . Corns and callosities 02/05/2008  . Depressive disorder   . Diabetes mellitus   .  Diarrhea 12/13/2012  . Dysphagia   . Edema 10/25/2007  . Fecal smearing 04/05/2012  . Hyperlipidemia   . Hypertension   . Insomnia   . Macular degeneration (senile) of retina, unspecified 03/17/2011  . Neuropathy   . Osteoporosis, senile   . Other atopic dermatitis and related conditions 12/13/2012  . Pain in joint, ankle and foot 08/07/2008  . Pain in joint, pelvic region and thigh 10/2008  . Pain in limb 11/18/2008  . Paroxysmal atrial tachycardia ([place])   . Peripheral vascular disease, unspecified ([place]) 08/14/2012  . Plantar fascial fibromatosis   . Rash and other nonspecific skin eruption 12/21/2010  . Rickets, active   . Spinal stenosis, unspecified region other than cervical 11/18/2008  . TIA (transient ischemic attack)   . Transient ischemic attack (TIA), and cerebral infarction without residual deficits(V12.54) 10/28/2002  . Type II or unspecified type diabetes mellitus with peripheral circulatory disorders, uncontrolled(250.72) 02/12/2013  . Unspecified hereditary and idiopathic peripheral neuropathy 03/17/2011  . Unspecified urinary incontinence 05/13/2008  . Venous insufficiency   . Vitamin D deficiency 11/07/2007   Past Surgical History:  Procedure Laterality Date  . BREAST MASS EXCISION    . CHOLECYSTECTOMY  1964  . COLONOSCOPY  12/18/2010   Dr Oletta Lamas  . DILATION AND CURETTAGE OF UTERUS  2003   post menopausal bleeding. Bicornate uterus/doble cervix, benign path  . ESOPHAGOGASTRODUODENOSCOPY (EGD) WITH PROPOFOL N/A 11/07/2015   Procedure: ESOPHAGOGASTRODUODENOSCOPY (EGD) WITH PROPOFOL;  Surgeon: Carol Ada, MD;  Location: [facility];  Service: Endoscopy;  Laterality: N/A;  . ESOPHAGOGASTRODUODENOSCOPY ENDOSCOPY  12/18/2010   Dr. Oletta Lamas slight gastritis  . HIP FRACTURE SURGERY Right 02/2010  .  INCISION / DRAINAGE HAND / FINGER  2001   cat bite  . TONSILLECTOMY AND ADENOIDECTOMY     as a child    reports that  has never smoked. she has never used smokeless tobacco. She reports  that she does not drink alcohol or use drugs. Social History   Socioeconomic History  . Marital status: Widowed    Spouse name: None  . Number of children: None  . Years of education: None  . Highest education level: None  Social Needs  . Financial resource strain: None  . Food insecurity - worry: None  . Food insecurity - inability: None  . Transportation needs - medical: None  . Transportation needs - non-medical: None  Occupational History  . None  Tobacco Use  . Smoking status: Never Smoker  . Smokeless tobacco: Never Used  Substance and Sexual Activity  . Alcohol use: No  . Drug use: No  . Sexual activity: No  Other Topics Concern  . None  Social History Narrative   Patient is Widowed. No children. 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 Diabetes Brother   . Stroke Mother     Pertinent  Health Maintenance Due  Topic Date Due  . OPHTHALMOLOGY EXAM  04/23/2017  . INFLUENZA VACCINE  05/18/2017  . HEMOGLOBIN A1C  03/21/2018  . FOOT EXAM  04/14/2018  . URINE MICROALBUMIN  04/18/2018  . DEXA SCAN  Completed  . PNA vac Low Risk Adult  Completed   Fall Risk  03/10/2016 09/10/2015 09/02/2014 08/29/2013  Falls in the past year? No No No No  Risk for fall due to : - History of fall(s);Impaired balance/gait;Impaired mobility;Mental status change - -   Depression screen [facility] 2/9 03/10/2016 09/10/2015 09/02/2014  Decreased Interest 0 0 1  Down, Depressed, Hopeless 0 3 3  PHQ - 2 Score 0 3 4  Altered sleeping - 3 2  Tired, decreased energy - 1 2  Change in appetite - 0 0  Feeling bad or failure about yourself  - 0 0  Trouble concentrating - 0 1  Moving slowly or fidgety/restless - 0 0  Suicidal thoughts - 0 0    PHQ-9 Score - 7 -  Difficult doing work/chores - Not difficult at all -    Functional Status Survey:    Allergies  Allergen Reactions  . Codeine     UNKNOWN  . Tramadol     UNKNOWN  . Vesicare [Solifenacin Succinate]     UNKNOWN    Outpatient Encounter Medications as of 09/27/2017  Medication Sig  . alendronate (FOSAMAX) 70 MG tablet Take 70 mg by mouth once a week. Take with a full glass of water on an empty stomach.  Marland Kitchen atenolol (TENORMIN) 25 MG tablet Take 12.5 mg by mouth every morning.   . calcium carbonate (TUMS - DOSED IN MG ELEMENTAL CALCIUM) 500 MG chewable tablet Chew 2 tablets by mouth 2 (two) times daily.  . clobetasol cream (TEMOVATE) 6.57 % 1 application daily. To bilateral legs from the Knee down every day before compression stockings applied Monday thur Friday  . clopidogrel (PLAVIX) 75 MG tablet Take 75 mg by mouth daily.  . Emollient (  EUCERIN) lotion Apply 1 mL topically as needed for dry skin.  Marland Kitchen gabapentin (NEURONTIN) 100 MG capsule Take 100 mg by mouth 2 (two) times daily.  Marland Kitchen linagliptin (TRADJENTA) 5 MG TABS tablet Take 5 mg by mouth daily. Every evening  . loperamide (IMODIUM) 2 MG capsule Take 2 mg by mouth as needed for diarrhea or loose stools.  . memantine (NAMENDA) 10 MG tablet Take 10 mg by mouth 2 (two) times daily.  Marland Kitchen nystatin cream (MYCOSTATIN) Apply 1 application topically 2 (two) times daily as needed.   . pantoprazole (PROTONIX) 40 MG tablet Take 1 tablet (40 mg total) by mouth 2 (two) times daily before a meal.  . saccharomyces boulardii (FLORASTOR) 250 MG capsule Take 250 mg by mouth 2 (two) times daily.  . sertraline (ZOLOFT) 100 MG tablet Take 200 mg by mouth daily.  Marland Kitchen spironolactone (ALDACTONE) 25 MG tablet Take 25 mg by mouth daily.  . vitamin B-12 (CYANOCOBALAMIN) 1000 MCG tablet Take 1,000 mcg by mouth every morning. Take one tablet daily  . [DISCONTINUED] sertraline (ZOLOFT) 50 MG tablet Take 150 mg by mouth daily.    No  facility-administered encounter medications on file as of 09/27/2017.     Review of Systems  Vitals:   09/27/17 1349  BP: (!) 171/85  Pulse: 79  Resp: 20  Temp: 98.5 F (36.9 C)  TempSrc: Oral  SpO2: 95%  Weight: 149 lb (67.6 kg)   Body mass index is 23.34 kg/m. Physical Exam  Labs reviewed: Basic Metabolic Panel: Recent Labs    04/18/17 08/16/17 09/20/17 0300  NA 135* 137 139  K 5.2 4.5 4.1  BUN 22* 12 12  CREATININE 1.3* 0.9 0.8   Liver Function Tests: Recent Labs    04/18/17 09/20/17 0300  AST 15 12*  ALT 11 8  ALKPHOS 90 114   No results for input(s): LIPASE, AMYLASE in the last 8760 hours. No results for input(s): AMMONIA in the last 8760 hours. CBC: Recent Labs    04/18/17 08/16/17 09/20/17 0300  WBC 12.5 11.3 14.3  HGB 17.8* 16.3* 16.0  HCT 57* 50* 50*  PLT 375 424* 483*   Cardiac Enzymes: No results for input(s): CKTOTAL, CKMB, CKMBINDEX, TROPONINI in the last 8760 hours. BNP: Invalid input(s): POCBNP Lab Results  Component Value Date   HGBA1C 7.5 09/20/2017   Lab Results  Component Value Date   TSH 3.30 07/19/2017   Lab Results  Component Value Date   VITAMINB12 1,641 09/20/2017   No results found for: FOLATE No results found for: IRON, TIBC, FERRITIN  Imaging and Procedures obtained recently: No results found.  Assessment/Plan There are no diagnoses linked to this encounter.   Family/ staff Communication:   Labs/tests ordered:

## 2017-09-27 NOTE — Progress Notes (Signed)
Location:  [place] Room Number: 128 Place of Service:  SNF (31) Provider: Jeily Guthridge L. Mariea Clonts, D.O., C.M.D.  Patient Care Team: Gayland Curry, DO as PCP - General (Geriatric Medicine) Community, Well Orpah Greek, MD as Consulting Physician (Gastroenterology) Lorretta Harp, MD as Consulting Physician (Cardiology) Garvin Fila, MD as Consulting Physician (Neurology) Bjorn Loser, MD as Consulting Physician (Urology) Pedro Earls, MD as Attending Physician [facility])  Extended Emergency Contact Information Primary Emergency Contact: Allena Earing [place] of [place] Phone: [phone] Relation: Friend Secondary Emergency Contact: [facility]  [place] Phone: [phone] Relation: Other HCPOA update:  Now De Burrs, III, step-grandson; may still speak with Antony Haste, but he is now handling finances aspect  Code Status: DNR Goals of Care: Advanced Directive information Advanced Directives 09/27/2017  Does Patient Have a Medical Advance Directive? Yes  Type of Paramedic of [place];Living will;Out of facility DNR (pink MOST or yellow form)  Does patient want to make changes to medical advance directive? No - Patient declined  Copy of [place] in Chart? Yes  Pre-existing out of facility DNR order (yellow form or pink MOST form) Yellow form placed in chart (order not valid for inpatient use)   Chief Complaint  Patient presents with  . Medicare Wellness    annual wellness visit    HPI: Patient is a 83 y.o. female seen in today for an annual wellness exam and due to continued weakness and decline.  She is now needing stand-up lift for transfers.  She's much weaker.  She had a peripheral smear back in August that was concerning for polycythemia vera.  Her counts are now dropping some vs then overall.  Could be a progression  to myelofibrosis and at risk for acute leukemia, also.  Was not on baby asa, but is on plavix.  Spoke with Antony Haste about her condition.  Agrees with med adjustments, but no aggressive workup as pt herself has indicated.  She is down 31 lbs since Jan of this year (2018).  Nursing reports increased issues with swallowing pills.  Many are being crushed and pt c/o taking tums and taste.  No longer safely ambulating.  Request made for pills to be reduced.    Depression screen [facility] 2/9 03/10/2016 09/10/2015 09/02/2014  Decreased Interest 0 0 1  Down, Depressed, Hopeless 0 3 3  PHQ - 2 Score 0 3 4  Altered sleeping - 3 2  Tired, decreased energy - 1 2  Change in appetite - 0 0  Feeling bad or failure about yourself  - 0 0  Trouble concentrating - 0 1  Moving slowly or fidgety/restless - 0 0  Suicidal thoughts - 0 0  PHQ-9 Score - 7 -  Difficult doing work/chores - Not difficult at all -    Fall Risk  03/10/2016 09/10/2015 09/02/2014 08/29/2013  Falls in the past year? No No No No  Risk for fall due to : - History of fall(s);Impaired balance/gait;Impaired mobility;Mental status change - -   MMSE - Mini Mental State Exam 09/10/2015  Orientation to time 2  Orientation to Place 5  Registration 3  Attention/ Calculation 5  Recall 3  Language- name 2 objects 2  Language- repeat 1  Language- follow 3 step command 3  Language- read & follow direction 1  Write a sentence 1  Copy design 0  Total score 26  MMSE at [facility] 03/01/17  with score 25/30, missed orientation to time and sentence was [employer] [facility]!  Passed clock.   Health Maintenance  Topic Date Due  . OPHTHALMOLOGY EXAM  04/23/2017  . INFLUENZA VACCINE  05/18/2017  . HEMOGLOBIN A1C  03/21/2018  . FOOT EXAM  04/14/2018  . URINE MICROALBUMIN  04/18/2018  . TETANUS/TDAP  11/03/2021  . DEXA SCAN  Completed  . PNA vac Low Risk Adult  Completed    Urinary incontinence? yes Functional Status Survey: Is the  patient deaf or have difficulty hearing?: No Does the patient have difficulty seeing, even when wearing glasses/contacts?: No Does the patient have difficulty concentrating, remembering, or making decisions?: Yes Does the patient have difficulty walking or climbing stairs?: Yes Does the patient have difficulty dressing or bathing?: Yes Does the patient have difficulty doing errands alone such as visiting a doctor's office or shopping?: Yes Exercise?no Diet?no, poor intake except at evening meal No exam data present Hearing:  No problem Dentition:  Poor lately Pain: none reported  Past Medical History:  Diagnosis Date  . Abnormality of gait 11/18/2008  . Anxiety   . Basilar artery syndrome   . Closed fracture of intertrochanteric section of femur ([place]) 03/04/2010  . Corns and callosities 02/05/2008  . Depressive disorder   . Diabetes mellitus   . Diarrhea 12/13/2012  . Dysphagia   . Edema 10/25/2007  . Fecal smearing 04/05/2012  . Hyperlipidemia   . Hypertension   . Insomnia   . Macular degeneration (senile) of retina, unspecified 03/17/2011  . Neuropathy   . Osteoporosis, senile   . Other atopic dermatitis and related conditions 12/13/2012  . Pain in joint, ankle and foot 08/07/2008  . Pain in joint, pelvic region and thigh 10/2008  . Pain in limb 11/18/2008  . Paroxysmal atrial tachycardia ([place])   . Peripheral vascular disease, unspecified ([place]) 08/14/2012  . Plantar fascial fibromatosis   . Rash and other nonspecific skin eruption 12/21/2010  . Rickets, active   . Spinal stenosis, unspecified region other than cervical 11/18/2008  . TIA (transient ischemic attack)   . Transient ischemic attack (TIA), and cerebral infarction without residual deficits(V12.54) 10/28/2002  . Type II or unspecified type diabetes mellitus with peripheral circulatory disorders, uncontrolled(250.72) 02/12/2013  . Unspecified hereditary and idiopathic peripheral neuropathy 03/17/2011  . Unspecified urinary  incontinence 05/13/2008  . Venous insufficiency   . Vitamin D deficiency 11/07/2007    Past Surgical History:  Procedure Laterality Date  . BREAST MASS EXCISION    . CHOLECYSTECTOMY  1964  . COLONOSCOPY  12/18/2010   Dr Oletta Lamas  . DILATION AND CURETTAGE OF UTERUS  2003   post menopausal bleeding. Bicornate uterus/doble cervix, benign path  . ESOPHAGOGASTRODUODENOSCOPY (EGD) WITH PROPOFOL N/A 11/07/2015   Procedure: ESOPHAGOGASTRODUODENOSCOPY (EGD) WITH PROPOFOL;  Surgeon: Carol Ada, MD;  [facility];  Service: Endoscopy;  Laterality: N/A;  . ESOPHAGOGASTRODUODENOSCOPY ENDOSCOPY  12/18/2010   Dr. Oletta Lamas slight gastritis  . HIP FRACTURE SURGERY Right 02/2010  . INCISION / DRAINAGE HAND / FINGER  2001   cat bite  . TONSILLECTOMY AND ADENOIDECTOMY     as a child    Family History  Problem Relation Age of Onset  . Diabetes Brother   . Stroke Mother     Social History   Socioeconomic History  . Marital status: Widowed    Spouse name: None  . Number of children: None  . Years of education: None  . Highest education level: None  Social Needs  . Financial resource strain: None  . Food insecurity - worry: None  . Food insecurity - inability: None  . Transportation needs - medical: None  . Transportation needs - non-medical: None  Occupational History  . None  Tobacco Use  . Smoking status: Never Smoker  . Smokeless tobacco: Never Used  Substance and Sexual Activity  . Alcohol use: No  . Drug use: No  . Sexual activity: No  Other Topics Concern  . None  Social History Narrative   Patient is Widowed. No children. 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 she has never used smokeless tobacco. She reports that she does not  drink alcohol or use drugs.   Allergies  Allergen Reactions  . Codeine     UNKNOWN  . Tramadol     UNKNOWN  . Vesicare [Solifenacin Succinate]     UNKNOWN    Outpatient Encounter Medications as of 09/27/2017  Medication Sig  . alendronate (FOSAMAX) 70 MG tablet Take 70 mg by mouth once a week. Take with a full glass of water on an empty stomach.  Marland Kitchen atenolol (TENORMIN) 25 MG tablet Take 12.5 mg by mouth every morning.   . calcium carbonate (TUMS - DOSED IN MG ELEMENTAL CALCIUM) 500 MG chewable tablet Chew 2 tablets by mouth 2 (two) times daily.  . clobetasol cream (TEMOVATE) 5.40 % 1 application daily. To bilateral legs from the Knee down every day before compression stockings applied Monday thur Friday  . clopidogrel (PLAVIX) 75 MG tablet Take 75 mg by mouth daily.  . Emollient (EUCERIN) lotion Apply 1 mL topically as needed for dry skin.  Marland Kitchen gabapentin (NEURONTIN) 100 MG capsule Take 100 mg by mouth 2 (two) times daily.  Marland Kitchen linagliptin (TRADJENTA) 5 MG TABS tablet Take 5 mg by mouth daily. Every evening  . loperamide (IMODIUM) 2 MG capsule Take 2 mg by mouth as needed for diarrhea or loose stools.  . memantine (NAMENDA) 10 MG tablet Take 10 mg by mouth 2 (two) times daily.  Marland Kitchen nystatin cream (MYCOSTATIN) Apply 1 application topically 2 (two) times daily as needed.   . pantoprazole (PROTONIX) 40 MG tablet Take 1 tablet (40 mg total) by mouth 2 (two) times daily before a meal.  . saccharomyces boulardii (FLORASTOR) 250 MG capsule Take 250 mg by mouth 2 (two) times daily.  . sertraline (ZOLOFT) 100 MG tablet Take 200 mg by mouth daily.  Marland Kitchen spironolactone (ALDACTONE) 25 MG tablet Take 25 mg by mouth daily.  . vitamin B-12 (CYANOCOBALAMIN) 1000 MCG tablet Take 1,000 mcg by mouth every morning. Take one tablet daily  . [DISCONTINUED] sertraline (ZOLOFT) 50 MG tablet Take 150 mg by mouth daily.    No facility-administered encounter medications on file as of 09/27/2017.     Review of  Systems:  Review of Systems  Constitutional: Positive for activity change, fatigue and unexpected weight change. Negative for appetite change, chills and fever.       Continues to eat poorly except at evening meal, very picky about foods  HENT: Negative for congestion and trouble swallowing.   Eyes: Negative for visual disturbance.       Glasses  Respiratory: Negative for chest  tightness and shortness of breath.   Cardiovascular: Negative for chest pain, palpitations and leg swelling.  Gastrointestinal: Positive for abdominal distention. Negative for abdominal pain, constipation and nausea.  Genitourinary: Negative for dysuria.  Musculoskeletal: Positive for gait problem.       Now requiring stand-up lift past few days due to increased weakness  Neurological: Positive for weakness and numbness. Negative for dizziness.  Psychiatric/Behavioral: Positive for confusion.       More issues with disorientation per staff and Antony Haste    Physical Exam: Vitals:   09/27/17 1349  BP: (!) 171/85  Pulse: 79  Resp: 20  Temp: 98.5 F (36.9 C)  TempSrc: Oral  SpO2: 95%  Weight: 149 lb (67.6 kg)   Body mass index is 23.34 kg/m. Physical Exam  Constitutional: No distress.  HENT:  Head: Normocephalic and atraumatic.  Eyes: Conjunctivae and EOM are normal. Pupils are equal, round, and reactive to light.  glasses  Neck: Neck supple. No JVD present.  Cardiovascular: Normal rate, regular rhythm, normal heart sounds and intact distal pulses.  Venous stasis changes of lower extremities bilaterally  Pulmonary/Chest: Effort normal and breath sounds normal. No respiratory distress.  Abdominal: Soft. Bowel sounds are normal. She exhibits distension. There is no tenderness. There is no rebound and no guarding.  Unable to note any hepatosplenomegaly, but umbilicus is diverted, no tenderness or discomfort; pt also sitting leaning left and seems to have postural instability to sit up straight--says "I can lean  the other way, but I'm sitting this way b/c of where you are"  Musculoskeletal: Normal range of motion.  Lymphadenopathy:    She has no cervical adenopathy.  Neurological: She is alert.  Oriented to person and place, not time, asks about date and time often  Psychiatric:  Personality seems the same to me, bantering some, sarcastic     Labs reviewed: Basic Metabolic Panel: Recent Labs    04/18/17 07/19/17 08/16/17 09/20/17 0300  NA 135*  --  137 139  K 5.2  --  4.5 4.1  BUN 22*  --  12 12  CREATININE 1.3*  --  0.9 0.8  TSH  --  3.30  --   --    Liver Function Tests: Recent Labs    04/18/17 09/20/17 0300  AST 15 12*  ALT 11 8  ALKPHOS 90 114   No results for input(s): LIPASE, AMYLASE in the last 8760 hours. No results for input(s): AMMONIA in the last 8760 hours. CBC: Recent Labs    04/18/17 08/16/17 09/20/17 0300  WBC 12.5 11.3 14.3  HGB 17.8* 16.3* 16.0  HCT 57* 50* 50*  PLT 375 424* 483*   Lipid Panel: No results for input(s): CHOL, HDL, LDLCALC, TRIG, CHOLHDL, LDLDIRECT in the last 8760 hours. Lab Results  Component Value Date   HGBA1C 7.5 09/20/2017    Assessment/Plan 1. Adult failure to thrive -continues to decline, losing weight, declining cognitively, becoming weaker and now requiring standup lift for transfers past few days -suspect due to progression of #2 into other bone marrow pathology--since we found a smear done in August that was already suspicious, we decided not to repeat this at this point -cont supportive care, her POA does not want an aggressive workup like a bone marrow biopsy b/c pt would not want this and also would not tolerate the treatments   2. Polycythemia vera ([place]) -added aspirin today 31m EC for this -monitor for bleeding b/c has had prior GI bleeding (had a large number  of gastric polyps in the past) -no further treatment of disease process due to goals of care  3. Type 2 diabetes mellitus with other circulatory complication,  without long-term current use of insulin (HCC) -has been well controlled with tradjenta, but pt having more difficulty swallowing and weight trending down which can be an effect of this med so will d/c it at this point  4. Peripheral vascular disease, unspecified -notable, has chronic venous insufficiency and not adherent with compression hose  5. Late onset Alzheimer's disease with behavioral disturbance -progressing more recently, disoriented to time now  6. Multiple gastric polyps -noted in the past, monitor for bleeding with addition of baby asa for polycythemia  7. Senile osteoporosis -d/c fosamax, already had vitamins for this stopped due to dysphagia and disliking them, d/c tums  8. Weakness -progressing, cont SNF level of care, now needing standup lift, due to failure to thrive related to polycythemia and progressing bone marrow path  9. Medicare annual wellness visit, subsequent -performed today--see flow sheets and note above -being followed here by ophtho==appears she needs appt -flu shot was given during flu shot clinic  10.  Pill dysphagia -d/c some pills due to increased difficulty swallowing them and poor po intake:  D/c tradjenta, b12, fosamax, florastor  Labs/tests ordered: no new  Sherry Rogus L. Keyron Pokorski, D.O. [facility] [address], [place] [address] Cell Phone (Mon-Fri 8am-5pm):  [phone] On Call:  [phone] & follow prompts after 5pm & weekends Office Phone:  [phone] Office Fax:  [phone]

## 2017-10-04 DIAGNOSIS — L814 Other melanin hyperpigmentation: Secondary | ICD-10-CM | POA: Diagnosis not present

## 2017-10-04 DIAGNOSIS — D0439 Carcinoma in situ of skin of other parts of face: Secondary | ICD-10-CM | POA: Diagnosis not present

## 2017-10-04 DIAGNOSIS — L570 Actinic keratosis: Secondary | ICD-10-CM | POA: Diagnosis not present

## 2017-11-04 ENCOUNTER — Non-Acute Institutional Stay (SKILLED_NURSING_FACILITY): Payer: Medicare Other | Admitting: Adult Health

## 2017-11-04 ENCOUNTER — Encounter: Payer: Self-pay | Admitting: Adult Health

## 2017-11-04 DIAGNOSIS — E08621 Diabetes mellitus due to underlying condition with foot ulcer: Secondary | ICD-10-CM | POA: Diagnosis not present

## 2017-11-04 DIAGNOSIS — L03115 Cellulitis of right lower limb: Secondary | ICD-10-CM | POA: Diagnosis not present

## 2017-11-04 DIAGNOSIS — L97419 Non-pressure chronic ulcer of right heel and midfoot with unspecified severity: Secondary | ICD-10-CM

## 2017-11-04 DIAGNOSIS — E1159 Type 2 diabetes mellitus with other circulatory complications: Secondary | ICD-10-CM

## 2017-11-04 NOTE — Progress Notes (Signed)
Location:  Occupational psychologist of Service:  SNF (31) Provider:   Cindi Carbon, ANP [place] [phone]   Gayland Curry, DO  Patient Care Team: Gayland Curry, DO as PCP - General (Geriatric Medicine) Community, Well Orpah Greek, MD as Consulting Physician (Gastroenterology) Lorretta Harp, MD as Consulting Physician (Cardiology) Garvin Fila, MD as Consulting Physician (Neurology) Bjorn Loser, MD as Consulting Physician (Urology) Pedro Earls, MD as Attending Physician [facility] Medicine)  Extended Emergency Contact Information Primary Emergency Contact: [place] Phone: [phone] Relation: Friend Secondary Emergency Contact: [facility]  [place] Phone: [phone] Relation: Other  Code Status:  DNR Goals of care: Advanced Directive information Advanced Directives 09/27/2017  Does Patient Have a Medical Advance Directive? Yes  Type of Paramedic of [place];Living will;Out of facility DNR (pink MOST or yellow form)  Does patient want to make changes to medical advance directive? No - Patient declined  Copy of [place] in Chart? Yes  Pre-existing out of facility DNR order (yellow form or pink MOST form) Yellow form placed in chart (order not valid for inpatient use)     Chief Complaint  Patient presents with  . Acute Visit    right heel wound red and painful    HPI:  Pt is a 84 y.o. female seen today for an acute visit for wound to the right heel that is painful and causing swelling and redness. She has a long standing hx of PVD and DM II.  There was a small diabetic ulcer to the right heel area that has been present for months and would open and close but was very small. The nurse reports there is a large area of dark tissue that is soft and painful when touched. Ms. Wienke  unfortunately is no longer ambulatory and at risk for pressure injury. She is diabetic but off meds as she had been eating well, losing weight, and her A1C in Dec was 5.9.    The good news is the staff reports that she is eating better, happier than she has ever been, and participating in activities.  Past Medical History:  Diagnosis Date  . Abnormality of gait 11/18/2008  . Anxiety   . Basilar artery syndrome   . Closed fracture of intertrochanteric section of femur ([place]) 03/04/2010  . Corns and callosities 02/05/2008  . Depressive disorder   . Diabetes mellitus   . Diarrhea 12/13/2012  . Dysphagia   . Edema 10/25/2007  . Fecal smearing 04/05/2012  . Hyperlipidemia   . Hypertension   . Insomnia   . Macular degeneration (senile) of retina, unspecified 03/17/2011  . Neuropathy   . Osteoporosis, senile   . Other atopic dermatitis and related conditions 12/13/2012  . Pain in joint, ankle and foot 08/07/2008  . Pain in joint, pelvic region and thigh 10/2008  . Pain in limb 11/18/2008  . Paroxysmal atrial tachycardia ([place])   . Peripheral vascular disease, unspecified ([place]) 08/14/2012  . Plantar fascial fibromatosis   . Rash and other nonspecific skin eruption 12/21/2010  . Rickets, active   . Spinal stenosis, unspecified region other than cervical 11/18/2008  . TIA (transient ischemic attack)   . Transient ischemic attack (TIA), and cerebral infarction without residual deficits(V12.54) 10/28/2002  . Type II or unspecified type diabetes mellitus with peripheral circulatory disorders, uncontrolled(250.72) 02/12/2013  . Unspecified hereditary and idiopathic peripheral neuropathy 03/17/2011  .  Unspecified urinary incontinence 05/13/2008  . Venous insufficiency   . Vitamin D deficiency 11/07/2007   Past Surgical History:  Procedure Laterality Date  . BREAST MASS EXCISION    . CHOLECYSTECTOMY  1964  . COLONOSCOPY  12/18/2010   Dr Oletta Lamas  . DILATION AND CURETTAGE OF UTERUS  2003   post menopausal  bleeding. Bicornate uterus/doble cervix, benign path  . ESOPHAGOGASTRODUODENOSCOPY (EGD) WITH PROPOFOL N/A 11/07/2015   Procedure: ESOPHAGOGASTRODUODENOSCOPY (EGD) WITH PROPOFOL;  Surgeon: Carol Ada, MD;  Location: [facility] ENDOSCOPY;  Service: Endoscopy;  Laterality: N/A;  . ESOPHAGOGASTRODUODENOSCOPY ENDOSCOPY  12/18/2010   Dr. Oletta Lamas slight gastritis  . HIP FRACTURE SURGERY Right 02/2010  . INCISION / DRAINAGE HAND / FINGER  2001   cat bite  . TONSILLECTOMY AND ADENOIDECTOMY     as a child    Allergies  Allergen Reactions  . Codeine     UNKNOWN  . Tramadol     UNKNOWN  . Vesicare [Solifenacin Succinate]     UNKNOWN    Outpatient Encounter Medications as of 11/04/2017  Medication Sig  . atenolol (TENORMIN) 25 MG tablet Take 12.5 mg by mouth every morning.   . clobetasol cream (TEMOVATE) 6.96 % 1 application daily. To bilateral legs from the Knee down every day before compression stockings applied Monday thur Friday  . clopidogrel (PLAVIX) 75 MG tablet Take 75 mg by mouth daily.  . Emollient (EUCERIN) lotion Apply 1 mL topically as needed for dry skin.  Marland Kitchen gabapentin (NEURONTIN) 100 MG capsule Take 100 mg by mouth 2 (two) times daily.  Marland Kitchen loperamide (IMODIUM) 2 MG capsule Take 2 mg by mouth as needed for diarrhea or loose stools.  . nystatin cream (MYCOSTATIN) Apply 1 application topically 2 (two) times daily as needed.   . pantoprazole (PROTONIX) 40 MG tablet Take 1 tablet (40 mg total) by mouth 2 (two) times daily before a meal.  . sertraline (ZOLOFT) 100 MG tablet Take 200 mg by mouth daily.  Marland Kitchen spironolactone (ALDACTONE) 25 MG tablet Take 25 mg by mouth daily.   No facility-administered encounter medications on file as of 11/04/2017.     Review of Systems  Constitutional: Negative for activity change, appetite change, chills, diaphoresis, fatigue, fever and unexpected weight change.  HENT: Negative for congestion.   Respiratory: Negative for cough, shortness of breath and wheezing.    Cardiovascular: Negative for chest pain, palpitations and leg swelling.  Gastrointestinal: Negative for abdominal distention, abdominal pain, constipation and diarrhea.  Genitourinary: Negative for difficulty urinating and dysuria.  Musculoskeletal: Positive for gait problem. Negative for arthralgias, back pain, joint swelling and myalgias.  Skin: Positive for color change and wound.  Neurological: Positive for weakness (general). Negative for dizziness, tremors, seizures, syncope, facial asymmetry, speech difficulty, light-headedness, numbness and headaches.  Psychiatric/Behavioral: Positive for confusion. Negative for agitation and behavioral problems.    Immunization History  Administered Date(s) Administered  . Influenza Inj Mdck Quad Pf 08/05/2016  . Influenza Whole 08/01/2013  . Influenza-Unspecified 08/01/2014, 07/31/2015, 08/05/2016  . PPD Test 03/09/2012  . Pneumococcal Conjugate-13 03/04/2015  . Pneumococcal Polysaccharide-23 10/18/1998  . Td 11/04/2011   Pertinent  Health Maintenance Due  Topic Date Due  . OPHTHALMOLOGY EXAM  04/23/2017  . INFLUENZA VACCINE  05/18/2017  . HEMOGLOBIN A1C  03/21/2018  . FOOT EXAM  04/14/2018  . URINE MICROALBUMIN  04/18/2018  . DEXA SCAN  Completed  . PNA vac Low Risk Adult  Completed   Fall Risk  03/10/2016 09/10/2015 09/02/2014 08/29/2013  Falls in the past year? No No No No  Risk for fall due to : - History of fall(s);Impaired balance/gait;Impaired mobility;Mental status change - -   Functional Status Survey:    Vitals:   11/04/17 1643  BP: 136/74  Pulse: 89  Resp: 20  Temp: 98.3 F (36.8 C)  SpO2: 93%   There is no height or weight on file to calculate BMI. Physical Exam  Constitutional: No distress.  HENT:  Head: Normocephalic and atraumatic.  Neck: No JVD present.  Cardiovascular: Normal rate and regular rhythm.  No murmur heard. The pedal pulse are diminished, difficult to palpate.   Pulmonary/Chest: Effort  normal and breath sounds normal. No respiratory distress. She has no wheezes.  Abdominal: Soft. Bowel sounds are normal.  Neurological: She is alert.  Very pleasant and smiling. Oriented to self and place but not time.   Skin: Skin is warm and dry. She is not diaphoretic. There is erythema.  BLE have chronic rubor but the right leg has worsened with more redness, warmth and swelling of the ankle. The redness stops below the knee which is typical for her but the color is more red than usual.  The heel has an area of black eschar that is soft and tender to touch.  Psychiatric: She has a normal mood and affect.    Labs reviewed: Recent Labs    04/18/17 08/16/17 09/20/17 0300  NA 135* 137 139  K 5.2 4.5 4.1  BUN 22* 12 12  CREATININE 1.3* 0.9 0.8   Recent Labs    04/18/17 09/20/17 0300  AST 15 12*  ALT 11 8  ALKPHOS 90 114   Recent Labs    04/18/17 08/16/17 09/20/17 0300  WBC 12.5 11.3 14.3  HGB 17.8* 16.3* 16.0  HCT 57* 50* 50*  PLT 375 424* 483*   Lab Results  Component Value Date   TSH 3.30 07/19/2017   Lab Results  Component Value Date   HGBA1C 7.5 09/20/2017   Lab Results  Component Value Date   CHOL 173 08/27/2015   HDL 38 08/27/2015   LDLCALC 102 08/27/2015   TRIG 163 (A) 08/27/2015    Significant Diagnostic Results in last 30 days:  No results found.  Assessment/Plan  1. Cellulitis of right lower extremity Does not appear to be systemically ill Bactrim DS 1 tab BID for 10 days Monitor VS and spreading of erythema  2. Type 2 diabetes mellitus with other circulatory complication, without long-term current use of insulin (HCC) Off medication Check CBGs BID for 1 week and report to [facility] due to ulceration  3. Diabetic ulcer of right heel associated with diabetes mellitus due to underlying condition, unspecified ulcer stage ([place]) She is no longer ambulatory and so I question if this was due to pressure but there are multiple factors as she previously had a  diabetic ulcer in the location and has a hx of PVD.  I do not have arterial studies for review.  She is not a candidate for surgery.  She is already on plavix and aspirin.  Wound care nurse to manage. The area appears to be a deep tissue problem with black echar that is macerated noted to the right heel. Will monitor closely, no necrosis of the toes.       Family/ staff Communication: discussed with staff/resident  Labs/tests ordered:  NA

## 2017-11-08 DIAGNOSIS — L739 Follicular disorder, unspecified: Secondary | ICD-10-CM | POA: Diagnosis not present

## 2017-11-08 DIAGNOSIS — E1140 Type 2 diabetes mellitus with diabetic neuropathy, unspecified: Secondary | ICD-10-CM | POA: Diagnosis not present

## 2017-11-08 DIAGNOSIS — B351 Tinea unguium: Secondary | ICD-10-CM | POA: Diagnosis not present

## 2017-11-14 ENCOUNTER — Encounter: Payer: Self-pay | Admitting: Adult Health

## 2017-11-14 ENCOUNTER — Non-Acute Institutional Stay (SKILLED_NURSING_FACILITY): Payer: Medicare Other | Admitting: Adult Health

## 2017-11-14 DIAGNOSIS — L03115 Cellulitis of right lower limb: Secondary | ICD-10-CM

## 2017-11-14 DIAGNOSIS — E13621 Other specified diabetes mellitus with foot ulcer: Secondary | ICD-10-CM | POA: Diagnosis not present

## 2017-11-14 DIAGNOSIS — L97419 Non-pressure chronic ulcer of right heel and midfoot with unspecified severity: Secondary | ICD-10-CM

## 2017-11-14 DIAGNOSIS — R21 Rash and other nonspecific skin eruption: Secondary | ICD-10-CM

## 2017-11-14 NOTE — Progress Notes (Signed)
Location:  Occupational psychologist of Service:  SNF (31) Provider:   Cindi Carbon, ANP [place] [phone]   Gayland Curry, DO  Patient Care Team: Gayland Curry, DO as PCP - General (Geriatric Medicine) Community, Well Orpah Greek, MD as Consulting Physician (Gastroenterology) Lorretta Harp, MD as Consulting Physician (Cardiology) Garvin Fila, MD as Consulting Physician (Neurology) Bjorn Loser, MD as Consulting Physician (Urology) Pedro Earls, MD as Attending Physician [facility] Medicine)  Extended Emergency Contact Information Primary Emergency Contact: [place] Phone: [phone] Relation: Friend Secondary Emergency Contact: [facility]  [place] Phone: [phone] Relation: Other  Code Status:  DNR Goals of care: Advanced Directive information Advanced Directives 09/27/2017  Does Patient Have a Medical Advance Directive? Yes  Type of Paramedic of [place];Living will;Out of facility DNR (pink MOST or yellow form)  Does patient want to make changes to medical advance directive? No - Patient declined  Copy of [place] in Chart? Yes  Pre-existing out of facility DNR order (yellow form or pink MOST form) Yellow form placed in chart (order not valid for inpatient use)     Chief Complaint  Patient presents with  . Acute Visit    rash to bottom and wound to right heel    HPI:  Pt is a 84 y.o. female seen today for an acute visit for follow up regarding right heel wound and rash to the buttocks.  Treated for cellulitis to the right heel with Bactrim on 11/04/17.  Staff report less pain and no drainage. No further redness or swelling. The staff nurse also reports a linear rash that crosses the midline to the low back area that is itchy in the shape of a rainbow.   Past Medical History:    Diagnosis Date  . Abnormality of gait 11/18/2008  . Anxiety   . Basilar artery syndrome   . Closed fracture of intertrochanteric section of femur ([place]) 03/04/2010  . Corns and callosities 02/05/2008  . Depressive disorder   . Diabetes mellitus   . Diarrhea 12/13/2012  . Dysphagia   . Edema 10/25/2007  . Fecal smearing 04/05/2012  . Hyperlipidemia   . Hypertension   . Insomnia   . Macular degeneration (senile) of retina, unspecified 03/17/2011  . Neuropathy   . Osteoporosis, senile   . Other atopic dermatitis and related conditions 12/13/2012  . Pain in joint, ankle and foot 08/07/2008  . Pain in joint, pelvic region and thigh 10/2008  . Pain in limb 11/18/2008  . Paroxysmal atrial tachycardia ([place])   . Peripheral vascular disease, unspecified ([place]) 08/14/2012  . Plantar fascial fibromatosis   . Rash and other nonspecific skin eruption 12/21/2010  . Rickets, active   . Spinal stenosis, unspecified region other than cervical 11/18/2008  . TIA (transient ischemic attack)   . Transient ischemic attack (TIA), and cerebral infarction without residual deficits(V12.54) 10/28/2002  . Type II or unspecified type diabetes mellitus with peripheral circulatory disorders, uncontrolled(250.72) 02/12/2013  . Unspecified hereditary and idiopathic peripheral neuropathy 03/17/2011  . Unspecified urinary incontinence 05/13/2008  . Venous insufficiency   . Vitamin D deficiency 11/07/2007   Past Surgical History:  Procedure Laterality Date  . BREAST MASS EXCISION    . CHOLECYSTECTOMY  1964  . COLONOSCOPY  12/18/2010   Dr Oletta Lamas  . DILATION AND CURETTAGE OF UTERUS  2003   post menopausal bleeding. Bicornate  uterus/doble cervix, benign path  . ESOPHAGOGASTRODUODENOSCOPY (EGD) WITH PROPOFOL N/A 11/07/2015   Procedure: ESOPHAGOGASTRODUODENOSCOPY (EGD) WITH PROPOFOL;  Surgeon: Carol Ada, MD;  [facility];  Service: Endoscopy;  Laterality: N/A;  . ESOPHAGOGASTRODUODENOSCOPY ENDOSCOPY  12/18/2010   Dr.  Oletta Lamas slight gastritis  . HIP FRACTURE SURGERY Right 02/2010  . INCISION / DRAINAGE HAND / FINGER  2001   cat bite  . TONSILLECTOMY AND ADENOIDECTOMY     as a child    Allergies  Allergen Reactions  . Codeine     UNKNOWN  . Tramadol     UNKNOWN  . Vesicare [Solifenacin Succinate]     UNKNOWN    Outpatient Encounter Medications as of 11/14/2017  Medication Sig  . aspirin EC 81 MG tablet Take 81 mg by mouth daily.  Marland Kitchen atenolol (TENORMIN) 25 MG tablet Take 12.5 mg by mouth every morning.   . clobetasol cream (TEMOVATE) 5.63 % 1 application daily. To bilateral legs from the Knee down every day before compression stockings applied Monday thur Friday  . clopidogrel (PLAVIX) 75 MG tablet Take 75 mg by mouth daily.  . Emollient (EUCERIN) lotion Apply 1 mL topically as needed for dry skin.  Marland Kitchen gabapentin (NEURONTIN) 100 MG capsule Take 100 mg by mouth 2 (two) times daily. 100 mg in the am and 200 mg in the pm  . loperamide (IMODIUM) 2 MG capsule Take 2 mg by mouth as needed for diarrhea or loose stools.  . nystatin cream (MYCOSTATIN) Apply 1 application topically 2 (two) times daily as needed.   . pantoprazole (PROTONIX) 40 MG tablet Take 1 tablet (40 mg total) by mouth 2 (two) times daily before a meal.  . sertraline (ZOLOFT) 100 MG tablet Take 200 mg by mouth daily.  Marland Kitchen spironolactone (ALDACTONE) 25 MG tablet Take 25 mg by mouth daily.   No facility-administered encounter medications on file as of 11/14/2017.     Review of Systems  Constitutional: Negative for chills, diaphoresis, fever, malaise/fatigue and weight loss.  Respiratory: Negative for cough, hemoptysis, sputum production and wheezing.   Cardiovascular: Positive for leg swelling. Negative for chest pain and PND.  Gastrointestinal: Negative for abdominal pain, constipation, diarrhea, nausea and vomiting.  Genitourinary: Negative for dysuria and urgency.  Musculoskeletal: Negative for back pain, falls, joint pain, myalgias and  neck pain.  Skin: Positive for itching and rash.       Wound to right heel  Neurological: Negative for weakness.  Psychiatric/Behavioral: Positive for memory loss. Negative for depression. The patient is not nervous/anxious and does not have insomnia.       Immunization History  Administered Date(s) Administered  . Influenza Inj Mdck Quad Pf 08/05/2016  . Influenza Whole 08/01/2013  . Influenza-Unspecified 08/01/2014, 07/31/2015, 08/05/2016  . PPD Test 03/09/2012  . Pneumococcal Conjugate-13 03/04/2015  . Pneumococcal Polysaccharide-23 10/18/1998  . Td 11/04/2011   Pertinent  Health Maintenance Due  Topic Date Due  . OPHTHALMOLOGY EXAM  11/04/2018 (Originally 04/23/2017)  . HEMOGLOBIN A1C  03/21/2018  . FOOT EXAM  04/14/2018  . URINE MICROALBUMIN  04/18/2018  . INFLUENZA VACCINE  Completed  . DEXA SCAN  Completed  . PNA vac Low Risk Adult  Completed   Fall Risk  03/10/2016 09/10/2015 09/02/2014 08/29/2013  Falls in the past year? No No No No  Risk for fall due to : - History of fall(s);Impaired balance/gait;Impaired mobility;Mental status change - -   Functional Status Survey:    Vitals:   11/14/17 1638  BP:  104/70  Pulse: (!) 58  Resp: 20  Temp: 99 F (37.2 C)  SpO2: 94%  Weight: 138 lb 3.2 oz (62.7 kg)   Body mass index is 21.65 kg/m.  Physical Exam  Cardiovascular: Normal rate and regular rhythm.  Trace bilat lower ext edema. Difficult to palpate pulses  Pulmonary/Chest: Effort normal and breath sounds normal.  Abdominal: Soft. Bowel sounds are normal.  Skin: Skin is warm and dry. Rash (macular rash in a linear fashion that crosses the midline in the shape of a rainbow) noted. There is erythema.  Right heel wound with black eschar tissue slightly smaller in size. No longer macerated. No drainage. Less tender but still somewhat painful. No further redness. She has chronic stasis dermatitis that has led to a rubor color to both lower ext.     Labs  reviewed: Recent Labs    04/18/17 08/16/17 09/20/17 0300  NA 135* 137 139  K 5.2 4.5 4.1  BUN 22* 12 12  CREATININE 1.3* 0.9 0.8   Recent Labs    04/18/17 09/20/17 0300  AST 15 12*  ALT 11 8  ALKPHOS 90 114   Recent Labs    04/18/17 08/16/17 09/20/17 0300  WBC 12.5 11.3 14.3  HGB 17.8* 16.3* 16.0  HCT 57* 50* 50*  PLT 375 424* 483*   Lab Results  Component Value Date   TSH 3.30 07/19/2017   Lab Results  Component Value Date   HGBA1C 7.5 09/20/2017   Lab Results  Component Value Date   CHOL 173 08/27/2015   HDL 38 08/27/2015   LDLCALC 102 08/27/2015   TRIG 163 (A) 08/27/2015    Significant Diagnostic Results in last 30 days:  No results found.  Assessment/Plan  1. Cellulitis of right lower extremity Improved Continue dressing changes per wound care Heel protectors and complete Bactrim course  2. Diabetic ulcer of right heel associated with diabetes mellitus due to underlying condition, unspecified ulcer stage (HCC) Check ABIs, known hx of PVD but no ABIs available for review but she would not be a candidate for aggressive care/surgery Continue plavix and aspirin No statin therapy due to age/debility Continue diabetic foot care  3. Rash Noted to lower back  ? Satellite lesions from yeast which has been a problem to her vaginal area Diflucan 150 mg x 1 dose   Family/ staff Communication: discussed with staff/resident  Labs/tests ordered:  NA

## 2017-11-15 ENCOUNTER — Non-Acute Institutional Stay (SKILLED_NURSING_FACILITY): Payer: Medicare Other | Admitting: Internal Medicine

## 2017-11-15 ENCOUNTER — Encounter: Payer: Self-pay | Admitting: Internal Medicine

## 2017-11-15 DIAGNOSIS — L89312 Pressure ulcer of right buttock, stage 2: Secondary | ICD-10-CM

## 2017-11-15 DIAGNOSIS — F02818 Dementia in other diseases classified elsewhere, unspecified severity, with other behavioral disturbance: Secondary | ICD-10-CM

## 2017-11-15 DIAGNOSIS — L89610 Pressure ulcer of right heel, unstageable: Secondary | ICD-10-CM | POA: Diagnosis not present

## 2017-11-15 DIAGNOSIS — D45 Polycythemia vera: Secondary | ICD-10-CM | POA: Diagnosis not present

## 2017-11-15 DIAGNOSIS — F0281 Dementia in other diseases classified elsewhere with behavioral disturbance: Secondary | ICD-10-CM | POA: Diagnosis not present

## 2017-11-15 DIAGNOSIS — D471 Chronic myeloproliferative disease: Secondary | ICD-10-CM

## 2017-11-15 DIAGNOSIS — Z7189 Other specified counseling: Secondary | ICD-10-CM

## 2017-11-15 DIAGNOSIS — G301 Alzheimer's disease with late onset: Secondary | ICD-10-CM | POA: Diagnosis not present

## 2017-11-15 DIAGNOSIS — R627 Adult failure to thrive: Secondary | ICD-10-CM

## 2017-11-15 DIAGNOSIS — E1159 Type 2 diabetes mellitus with other circulatory complications: Secondary | ICD-10-CM | POA: Diagnosis not present

## 2017-11-15 DIAGNOSIS — F324 Major depressive disorder, single episode, in partial remission: Secondary | ICD-10-CM | POA: Diagnosis not present

## 2017-11-15 NOTE — Progress Notes (Signed)
Patient ID: Kara Harris, female   DOB: 01/13/1923, 84 y.o.   MRN: [id number]  Provider:  Rexene Edison. Mariea Clonts, D.O., C.M.D. Location: [facility] Room Number: 128 Place of Service:  SNF (31)   PCP: Gayland Curry, DO Patient Care Team: Gayland Curry, DO as PCP - General (Geriatric Medicine) [facility] Orpah Greek, MD as Consulting Physician (Gastroenterology) Lorretta Harp, MD as Consulting Physician (Cardiology) Garvin Fila, MD as Consulting Physician (Neurology) Bjorn Loser, MD as Consulting Physician (Urology) Pedro Earls, MD as Attending Physician (Family Medicine)  Extended Emergency Contact Information Primary Emergency Contact: Allena Earing [place] Phone: [phone] Relation: Friend Secondary Emergency Contact: [facility]  [place] Phone: [phone] Relation: Other  Code Status: DNR Goals of Care: Advanced Directive information Advanced Directives 11/15/2017  Does Patient Have a Medical Advance Directive? Yes  Type of Advance Directive Out of facility DNR (pink MOST or yellow form);[place];Living will  Does patient want to make changes to medical advance directive? No - Patient declined  Copy of [place] in Chart? Yes  Pre-existing out of facility DNR order (yellow form or pink MOST form) Yellow form placed in chart (order not valid for inpatient use);Pink MOST form placed in chart (order not valid for inpatient use)   Chief Complaint  Patient presents with  . Annual Exam    physical    HPI: Patient is a 84 y.o. female seen today for an annual comprehensive examination.  She's had polycythemia vera and recently has had some increase in leukocytosis and thrombocytosis, as well.  She's had peripheral smears that have suggested progression to a myeloproliferative neoplasm.  As this has been noted,  she's also been losing weight, her food preferences have changed to sweets and starches primarily, she's become increasingly confused, weak and is even having periods of agitation with staff during care.  She's developed pressure ulcers of her right buttock and her right heel.  She's noted to have an asymmetric abdomen though splenomegaly is not grossly palpable.  She also questions each medication provided to her in a paranoid way and has refused the vistaril that was recommended by psychiatry.  She does not eat well in her room.  She has lost 10 lbs in one month and has had loose stools the past 1-2 days.  Staff had wanted a UA c+s, but pt has no symptoms related to her bladder.  She is falling asleep in her chair, lopsided.  Due to her progressive decline, I spoke with De Burrs, her grandson-in-law and HCPOA about hospice care.  See ACP note in tab and assessment and plan for details.    Past Medical History:  Diagnosis Date  . Abnormality of gait 11/18/2008  . Anxiety   . Basilar artery syndrome   . Closed fracture of intertrochanteric section of femur ([place]) 03/04/2010  . Corns and callosities 02/05/2008  . Depressive disorder   . Diabetes mellitus   . Diarrhea 12/13/2012  . Dysphagia   . Edema 10/25/2007  . Fecal smearing 04/05/2012  . Hyperlipidemia   . Hypertension   . Insomnia   . Macular degeneration (senile) of retina, unspecified 03/17/2011  . Neuropathy   . Osteoporosis, senile   . Other atopic dermatitis and related conditions 12/13/2012  . Pain in joint, ankle and foot 08/07/2008  . Pain in joint, pelvic region and thigh 10/2008  . Pain in limb  11/18/2008  . Paroxysmal atrial tachycardia ([place])   . Peripheral vascular disease, unspecified ([place]) 08/14/2012  . Plantar fascial fibromatosis   . Rash and other nonspecific skin eruption 12/21/2010  . Rickets, active   . Spinal stenosis, unspecified region other than cervical 11/18/2008  . TIA (transient ischemic attack)   . Transient  ischemic attack (TIA), and cerebral infarction without residual deficits(V12.54) 10/28/2002  . Type II or unspecified type diabetes mellitus with peripheral circulatory disorders, uncontrolled(250.72) 02/12/2013  . Unspecified hereditary and idiopathic peripheral neuropathy 03/17/2011  . Unspecified urinary incontinence 05/13/2008  . Venous insufficiency   . Vitamin D deficiency 11/07/2007   Past Surgical History:  Procedure Laterality Date  . BREAST MASS EXCISION    . CHOLECYSTECTOMY  1964  . COLONOSCOPY  12/18/2010   Dr Oletta Lamas  . DILATION AND CURETTAGE OF UTERUS  2003   post menopausal bleeding. Bicornate uterus/doble cervix, benign path  . ESOPHAGOGASTRODUODENOSCOPY (EGD) WITH PROPOFOL N/A 11/07/2015   Procedure: ESOPHAGOGASTRODUODENOSCOPY (EGD) WITH PROPOFOL;  Surgeon: Carol Ada, MD;  Location: [facility];  Service: Endoscopy;  Laterality: N/A;  . ESOPHAGOGASTRODUODENOSCOPY ENDOSCOPY  12/18/2010   Dr. Oletta Lamas slight gastritis  . HIP FRACTURE SURGERY Right 02/2010  . INCISION / DRAINAGE HAND / FINGER  2001   cat bite  . TONSILLECTOMY AND ADENOIDECTOMY     as a child    reports that  has never smoked. she has never used smokeless tobacco. She reports that she does not drink alcohol or use drugs. Social History   Socioeconomic History  . Marital status: Widowed    Spouse name: None  . Number of children: None  . Years of education: None  . Highest education level: None  Social Needs  . Financial resource strain: None  . Food insecurity - worry: None  . Food insecurity - inability: None  . Transportation needs - medical: None  . Transportation needs - non-medical: None  Occupational History  . None  Tobacco Use  . Smoking status: Never Smoker  . Smokeless tobacco: Never Used  Substance and Sexual Activity  . Alcohol use: No  . Drug use: No  . Sexual activity: No  Other Topics Concern  . None  Social History Narrative   Patient is Widowed. No children. 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 Diabetes Brother   . Stroke Mother     Pertinent  Health Maintenance Due  Topic Date Due  . OPHTHALMOLOGY EXAM  11/04/2018 (Originally 04/23/2017)  . HEMOGLOBIN A1C  03/21/2018  . FOOT EXAM  04/14/2018  . URINE MICROALBUMIN  04/18/2018  . INFLUENZA VACCINE  Completed  . DEXA SCAN  Completed  . PNA vac Low Risk Adult  Completed   Fall Risk  03/10/2016 09/10/2015 09/02/2014 08/29/2013  Falls in the past year? No No No No  Risk for fall due to : - History of fall(s);Impaired balance/gait;Impaired mobility;Mental status change - -   Depression screen [facility] 2/9 03/10/2016 09/10/2015 09/02/2014  Decreased Interest 0 0 1  Down, Depressed, Hopeless 0 3 3  PHQ - 2 Score 0 3 4  Altered sleeping - 3 2  Tired, decreased energy - 1 2  Change in appetite -  0 0  Feeling bad or failure about yourself  - 0 0  Trouble concentrating - 0 1  Moving slowly or fidgety/restless - 0 0  Suicidal thoughts - 0 0  PHQ-9 Score - 7 -  Difficult doing work/chores - Not difficult at all -    Functional Status Survey:    Allergies  Allergen Reactions  . Codeine     UNKNOWN  . Tramadol     UNKNOWN  . Vesicare [Solifenacin Succinate]     UNKNOWN    Outpatient Encounter Medications as of 11/15/2017  Medication Sig  . aspirin EC 81 MG tablet Take 81 mg by mouth daily.  Marland Kitchen atenolol (TENORMIN) 25 MG tablet Take 12.5 mg by mouth every morning.   . clobetasol cream (TEMOVATE) 6.07 % 1 application daily. To bilateral legs from the Knee down every day before compression stockings applied Monday thur Friday  . clopidogrel (PLAVIX) 75 MG tablet Take 75 mg by mouth daily.  . Emollient (EUCERIN) lotion Apply 1 mL topically as needed for  dry skin.  Marland Kitchen gabapentin (NEURONTIN) 100 MG capsule Take 100 mg by mouth 2 (two) times daily. 100 mg in the am and 200 mg in the pm  . hydrOXYzine (ATARAX/VISTARIL) 10 MG tablet Take 10 mg by mouth 2 (two) times daily as needed (agitation).  Marland Kitchen loperamide (IMODIUM) 2 MG capsule Take 2 mg by mouth as needed for diarrhea or loose stools.  . nystatin (NYSTATIN) powder Apply Nystatin powder to reddened areas BID (lower back), if not healed after 7 days notify MD for further orders.  . nystatin cream (MYCOSTATIN) Apply 1 application topically 2 (two) times daily as needed.   . pantoprazole (PROTONIX) 40 MG tablet Take 1 tablet (40 mg total) by mouth 2 (two) times daily before a meal.  . sertraline (ZOLOFT) 100 MG tablet Take 200 mg by mouth daily.  Marland Kitchen spironolactone (ALDACTONE) 25 MG tablet Take 25 mg by mouth daily.   No facility-administered encounter medications on file as of 11/15/2017.     Review of Systems  Constitutional: Positive for activity change, appetite change and fatigue. Negative for fever.  HENT: Negative for congestion.   Eyes:       Glasses  Respiratory: Negative for chest tightness and shortness of breath.   Cardiovascular: Positive for leg swelling. Negative for chest pain and palpitations.  Gastrointestinal: Positive for abdominal distention and diarrhea. Negative for abdominal pain, blood in stool, constipation, nausea, rectal pain and vomiting.  Genitourinary: Negative for dysuria.       Incontinence of bladder and bowel recentlly  Musculoskeletal: Positive for gait problem.       So weak that lift has been needed for transfers  Skin:       Patchy red skin; right heel and right buttock pressure ulcers  Neurological: Positive for weakness. Negative for dizziness.  Hematological: Negative for adenopathy.  Psychiatric/Behavioral: Positive for agitation, behavioral problems and confusion. Negative for hallucinations, sleep disturbance and suicidal ideas. The patient is not  nervous/anxious.     Vitals:   11/15/17 1133  BP: 104/70  Pulse: (!) 58  Resp: 20  Temp: 99 F (37.2 C)  TempSrc: Oral  SpO2: 94%  Weight: 138 lb (62.6 kg)   Body mass index is 21.61 kg/m. Physical Exam  Constitutional: No distress.  Ill-appearing female resting in her recliner chair, leaning off to the left  HENT:  Head: Normocephalic and atraumatic.  Right Ear: External ear normal.  Left Ear: External ear  normal.  Nose: Nose normal.  Mouth/Throat: Oropharynx is clear and moist. No oropharyngeal exudate.  Eyes: Conjunctivae and EOM are normal. Pupils are equal, round, and reactive to light.  glasses  Neck: Normal range of motion. Neck supple. No JVD present. No tracheal deviation present. No thyromegaly present.  Cardiovascular: Normal rate, regular rhythm, normal heart sounds and intact distal pulses.  Pulmonary/Chest: Effort normal and breath sounds normal. No respiratory distress. She has no rales.  Abdominal: Soft. Bowel sounds are normal. She exhibits distension. She exhibits no mass. There is no tenderness. There is no rebound and no guarding.  asymmetric abdomen (pt not able to sit up straight at this point, posturally weak)  Musculoskeletal: Normal range of motion.  Lymphadenopathy:    She has no cervical adenopathy.  Neurological: She is alert. No cranial nerve deficit.  Alert and conversive during visit, but staff have noted more periods of sleepiness   Skin:  Right heel ulcer, unstageable with mepilex in place and wearing heel lifts for support and elevating in recliner; right buttock ulcer, stage 2, 1cm x 0.6cm with hydrocolloid dressing in use; getting diflucan for rash over buttock and nystatin   Psychiatric:  Pleasant, expresses disappointment about being unable to do things like she used to and relying on others for help    Labs reviewed: Basic Metabolic Panel: Recent Labs    04/18/17 08/16/17 09/20/17 0300  NA 135* 137 139  K 5.2 4.5 4.1  BUN 22* 12  12  CREATININE 1.3* 0.9 0.8   Liver Function Tests: Recent Labs    04/18/17 09/20/17 0300  AST 15 12*  ALT 11 8  ALKPHOS 90 114   No results for input(s): LIPASE, AMYLASE in the last 8760 hours. No results for input(s): AMMONIA in the last 8760 hours. CBC: Recent Labs    04/18/17 08/16/17 09/20/17 0300  WBC 12.5 11.3 14.3  HGB 17.8* 16.3* 16.0  HCT 57* 50* 50*  PLT 375 424* 483*   Cardiac Enzymes: No results for input(s): CKTOTAL, CKMB, CKMBINDEX, TROPONINI in the last 8760 hours. BNP: Invalid input(s): POCBNP Lab Results  Component Value Date   HGBA1C 7.5 09/20/2017   Lab Results  Component Value Date   TSH 3.30 07/19/2017   Lab Results  Component Value Date   VITAMINB12 1,641 09/20/2017   Assessment/Plan 1. Myeloproliferative disorder ([place]) -seems her PV is progressing into a malignancy at this point--discussed with Tom and nursing staff and pt who has not wanted a bone marrow biopsy when discussed on two previous occasions, as well -cont comfort care and involve hospice now  2. Polycythemia vera ([place]) -original disease, cont supportive care  3. Major depressive disorder with single episode, in partial remission ([place]) -cont her zoloft now at 225m po daily  4. Late onset Alzheimer's disease with behavioral disturbance -cont vistaril for anxiety and agitation due to combativeness with staff throwing items from bedside table onto the floor -has been paranoid delusional that things are being stolen from her room, also and wanted to call the police about it--staff managed to calm her from that episode, but she now believes the responsible individual was arrested b/c she saw that person's home filled with her things  5. Type 2 diabetes mellitus with other circulatory complication, without long-term current use of insulin (H[facility]) -has been controlled given her frail state--avoid cbgs and labs at this point  6. Pressure injury of right buttock, stage 2 -cont current  mgt per wound care nurse as above  7. Unstageable pressure ulcer of right heel ([place]) -cont current management with offloading of heels -nutritional status poor despite attempts at protein supplements and staff encouragement, would expect with her malignancy underlying  8. Adult failure to thrive -due to mix of MPD and dementia at this point -hospice referral placed today to [facility]  9.  Advance care planning Last Advance Care Planning (ACP) Note    Gayland Curry, DO (Physician) 11/22/2017 10:27  Geriatrics         Spoke with patient and grandson-in-law/HCPOA, De Burrs, re: hospice care option for Kara Harris.    Pt already has DNR form on file, living will and hcpoa in documents.  Gershon Mussel reports that his mother had [facility] services and they loved them. He questioned if Kara Harris would qualify and we discussed my reasons why I felt her prognosis was less than 6 mos:  Weight loss, decreased appetite, change in blood panel, skin breakdown, cognitive decline, functional decline requiring lift for transfers, postural instability, incontinence.  We discussed her previously expressed wishes for good quality of life and avoiding aggressive workups that would be painful or interfere with this wish.  Gershon Mussel is agreeable to a referral to [facility] and could meet with them Thursday after 4pm if they are available.  He would also like to officially meet me next Tuesday at [place] referral was placed.  29 mins spent discussing this with Gershon Mussel, nursing staff, and pt herself.         Remaining 30 mins of visit spent on annual exam  Family/ staff Communication: discussed with snf nurse, nurse manager, grandson-in-law, De Burrs by phone  Labs/tests ordered:  Hospice referral  [place]. Loany Neuroth, D.O. [facility] [address], [place] [address] Cell Phone (Mon-Fri 8am-5pm):  [phone] On Call:  [phone] & follow prompts after 5pm & weekends Office  Phone:  [phone] Office Fax:  [phone]

## 2017-11-22 DIAGNOSIS — D471 Chronic myeloproliferative disease: Secondary | ICD-10-CM | POA: Insufficient documentation

## 2017-11-22 DIAGNOSIS — F324 Major depressive disorder, single episode, in partial remission: Secondary | ICD-10-CM | POA: Insufficient documentation

## 2017-11-22 NOTE — ACP (Advance Care Planning) (Signed)
**Note Kara-Identified via Obfuscation** Spoke with patient and grandson-in-law/HCPOA, Kara Harris, re: hospice care option for Kara Harris.    Pt already has DNR form on file, living will and hcpoa in documents.  Kara Harris reports that his mother had [facility] services and they loved them. He questioned if Kara Harris would qualify and we discussed my reasons why I felt her prognosis was less than 6 mos:  Weight loss, decreased appetite, change in blood panel, skin breakdown, cognitive decline, functional decline requiring lift for transfers, postural instability, incontinence.  We discussed her previously expressed wishes for good quality of life and avoiding aggressive workups that would be painful or interfere with this wish.  Kara Harris is agreeable to a referral to [facility] and could meet with them Thursday after 4pm if they are available.  He would also like to officially meet me next Tuesday at [place] referral was placed.  29 mins spent discussing this with Kara Harris, nursing staff, and pt herself.

## 2017-11-24 DIAGNOSIS — F339 Major depressive disorder, recurrent, unspecified: Secondary | ICD-10-CM | POA: Diagnosis not present

## 2017-12-05 ENCOUNTER — Non-Acute Institutional Stay (SKILLED_NURSING_FACILITY): Payer: Medicare Other | Admitting: Adult Health

## 2017-12-05 ENCOUNTER — Encounter: Payer: Self-pay | Admitting: Adult Health

## 2017-12-05 DIAGNOSIS — I739 Peripheral vascular disease, unspecified: Secondary | ICD-10-CM | POA: Diagnosis not present

## 2017-12-05 DIAGNOSIS — G301 Alzheimer's disease with late onset: Secondary | ICD-10-CM

## 2017-12-05 DIAGNOSIS — L97419 Non-pressure chronic ulcer of right heel and midfoot with unspecified severity: Secondary | ICD-10-CM

## 2017-12-05 DIAGNOSIS — F0281 Dementia in other diseases classified elsewhere with behavioral disturbance: Secondary | ICD-10-CM

## 2017-12-05 DIAGNOSIS — E08621 Diabetes mellitus due to underlying condition with foot ulcer: Secondary | ICD-10-CM

## 2017-12-05 DIAGNOSIS — S60219A Contusion of unspecified wrist, initial encounter: Secondary | ICD-10-CM | POA: Diagnosis not present

## 2017-12-05 DIAGNOSIS — F02818 Dementia in other diseases classified elsewhere, unspecified severity, with other behavioral disturbance: Secondary | ICD-10-CM

## 2017-12-05 NOTE — Progress Notes (Addendum)
Location:  [place] Room Number: 128/A Place of Service:  SNF [phone]) Provider:  Royal Hawthorn, ANP-BC  Gayland Curry, DO  Patient Care Team: Gayland Curry, DO as PCP - General (Geriatric Medicine) Community, Well Orpah Greek, MD as Consulting Physician (Gastroenterology) Lorretta Harp, MD as Consulting Physician (Cardiology) Garvin Fila, MD as Consulting Physician (Neurology) Bjorn Loser, MD as Consulting Physician (Urology) Pedro Earls, MD as Attending Physician (Family Medicine)  Extended Emergency Contact Information Primary Emergency Contact: Allena Earing [place] of [place] Phone: [phone] Relation: Friend Secondary Emergency Contact: [facility]  [place] Phone: [phone] Relation: Other  Code Status:  DNR Goals of care: Advanced Directive information Advanced Directives 11/15/2017  Does Patient Have a Medical Advance Directive? Yes  Type of Advance Directive Out of facility DNR (pink MOST or yellow form);[place];Living will  Does patient want to make changes to medical advance directive? No - Patient declined  Copy of [place] in Chart? Yes  Pre-existing out of facility DNR order (yellow form or pink MOST form) Yellow form placed in chart (order not valid for inpatient use);Pink MOST form placed in chart (order not valid for inpatient use)     Chief Complaint  Patient presents with  . Agitation  . Foot Ulcer    HPI:  Pt is a 84 y.o. female seen today for an acute visit for increased agitation and a diabetic/pressure foot ulcer on the right heel.   Dementia with behaviors: she has had issues with agitation/depressive symptoms and was seen by Dr. Casimiro Needle. Initially increasing the zoloft to 200 mg helped but now she is pinching, hitting, and threatening to kick a pregnant care giver. She states that  she knows she is doing this and "has resorted to hitting because of the way she is treated".  She is having difficulty with being told what to do and has also used racial slurs at some of the staff. It helps to have the nurse in the room with personal care. The nurse tried giving pain med and vistaril prior to care but this has not helped.  She denies any s/s of infection such as cough, congestion, dysuria, abd pain etc. Apparently in an attempt to hit a staff member she hit her right wrist on the wall which caused bruising.   Right heel ulcer: This area came up after she was no longer ambulatory but prior to this was a small diabetic foot ulcer. The area remains very tender to touch.  She was treated on 1/18 with 10 days of Bactrim due to cellulitis. The redness and warmth has subsided. There is very minimal drainage now. Tenderness is the pain issue. She is followed by hospice due to dementia, weight loss, and FTT issues.  ABI right 0.66 left 0.58 Feb of 2018  Past Medical History:  Diagnosis Date  . Abnormality of gait 11/18/2008  . Anxiety   . Basilar artery syndrome   . Closed fracture of intertrochanteric section of femur ([place]) 03/04/2010  . Corns and callosities 02/05/2008  . Depressive disorder   . Diabetes mellitus   . Diarrhea 12/13/2012  . Dysphagia   . Edema 10/25/2007  . Fecal smearing 04/05/2012  . Hyperlipidemia   . Hypertension   . Insomnia   . Macular degeneration (senile) of retina, unspecified 03/17/2011  . Neuropathy   . Osteoporosis, senile   . Other atopic dermatitis and related conditions  12/13/2012  . Pain in joint, ankle and foot 08/07/2008  . Pain in joint, pelvic region and thigh 10/2008  . Pain in limb 11/18/2008  . Paroxysmal atrial tachycardia ([place])   . Peripheral vascular disease, unspecified ([place]) 08/14/2012  . Plantar fascial fibromatosis   . Rash and other nonspecific skin eruption 12/21/2010  . Rickets, active   . Spinal stenosis, unspecified region other than  cervical 11/18/2008  . TIA (transient ischemic attack)   . Transient ischemic attack (TIA), and cerebral infarction without residual deficits(V12.54) 10/28/2002  . Type II or unspecified type diabetes mellitus with peripheral circulatory disorders, uncontrolled(250.72) 02/12/2013  . Unspecified hereditary and idiopathic peripheral neuropathy 03/17/2011  . Unspecified urinary incontinence 05/13/2008  . Venous insufficiency   . Vitamin D deficiency 11/07/2007   Past Surgical History:  Procedure Laterality Date  . BREAST MASS EXCISION    . CHOLECYSTECTOMY  1964  . COLONOSCOPY  12/18/2010   Dr Oletta Lamas  . DILATION AND CURETTAGE OF UTERUS  2003   post menopausal bleeding. Bicornate uterus/doble cervix, benign path  . ESOPHAGOGASTRODUODENOSCOPY (EGD) WITH PROPOFOL N/A 11/07/2015   Procedure: ESOPHAGOGASTRODUODENOSCOPY (EGD) WITH PROPOFOL;  Surgeon: Carol Ada, MD;  Location: [facility];  Service: Endoscopy;  Laterality: N/A;  . ESOPHAGOGASTRODUODENOSCOPY ENDOSCOPY  12/18/2010   Dr. Oletta Lamas slight gastritis  . HIP FRACTURE SURGERY Right 02/2010  . INCISION / DRAINAGE HAND / FINGER  2001   cat bite  . TONSILLECTOMY AND ADENOIDECTOMY     as a child    Allergies  Allergen Reactions  . Codeine     UNKNOWN  . Tramadol     UNKNOWN  . Vesicare [Solifenacin Succinate]     UNKNOWN    Outpatient Encounter Medications as of 12/05/2017  Medication Sig  . aspirin EC 81 MG tablet Take 81 mg by mouth daily.  Marland Kitchen atenolol (TENORMIN) 25 MG tablet Take 12.5 mg by mouth every morning.   . clopidogrel (PLAVIX) 75 MG tablet Take 75 mg by mouth daily.  Marland Kitchen gabapentin (NEURONTIN) 100 MG capsule Take 100 mg by mouth 2 (two) times daily. 100 mg in the am and 200 mg in the pm  . HYDROcodone-acetaminophen (NORCO/VICODIN) 5-325 MG tablet Take 1 tablet by mouth every 6 (six) hours as needed for moderate pain.  . hydrOXYzine (ATARAX/VISTARIL) 10 MG tablet Take 10 mg by mouth 2 (two) times daily as needed (agitation).  Marland Kitchen  loperamide (IMODIUM) 2 MG capsule Take 2 mg by mouth as needed for diarrhea or loose stools.  . mirtazapine (REMERON) 30 MG tablet Take 30 mg by mouth at bedtime.  Marland Kitchen nystatin cream (MYCOSTATIN) Apply 1 application topically 2 (two) times daily as needed.   . pantoprazole (PROTONIX) 40 MG tablet Take 1 tablet (40 mg total) by mouth 2 (two) times daily before a meal.  . sertraline (ZOLOFT) 100 MG tablet Take 200 mg by mouth daily.  Marland Kitchen spironolactone (ALDACTONE) 25 MG tablet Take 25 mg by mouth daily.  . clobetasol cream (TEMOVATE) 2.35 % 1 application daily. To bilateral legs from the Knee down every day before compression stockings applied Monday thur Friday  . Emollient (EUCERIN) lotion Apply 1 mL topically as needed for dry skin.   No facility-administered encounter medications on file as of 12/05/2017.     Review of Systems  Constitutional: Negative for activity change, appetite change, fever and unexpected weight change.  HENT: Negative.   Respiratory: Negative.  Negative for cough, shortness of breath and wheezing.   Cardiovascular: Negative.  Negative for chest pain, palpitations and leg swelling.  Gastrointestinal: Negative for abdominal distention, constipation and diarrhea.  Genitourinary: Negative for difficulty urinating, dysuria and flank pain.  Skin: Positive for color change and wound.  Neurological: Negative.   Psychiatric/Behavioral: Positive for agitation, behavioral problems and confusion. Negative for sleep disturbance.    Immunization History  Administered Date(s) Administered  . Influenza Inj Mdck Quad Pf 08/05/2016  . Influenza Whole 08/01/2013  . Influenza-Unspecified 08/01/2014, 07/31/2015, 08/05/2016, 10/19/2017  . PPD Test 03/09/2012  . Pneumococcal Conjugate-13 03/04/2015  . Pneumococcal Polysaccharide-23 10/18/1998  . Td 11/04/2011   Pertinent  Health Maintenance Due  Topic Date Due  . OPHTHALMOLOGY EXAM  11/04/2018 (Originally 04/23/2017)  . HEMOGLOBIN A1C   03/21/2018  . FOOT EXAM  04/14/2018  . URINE MICROALBUMIN  04/18/2018  . INFLUENZA VACCINE  Completed  . DEXA SCAN  Completed  . PNA vac Low Risk Adult  Completed   Fall Risk  03/10/2016 09/10/2015 09/02/2014 08/29/2013  Falls in the past year? No No No No  Risk for fall due to : - History of fall(s);Impaired balance/gait;Impaired mobility;Mental status change - -   Functional Status Survey:    Vitals:   12/05/17 1317  BP: 124/84  Pulse: 80  Resp: 20  Temp: (!) 97.5 F (36.4 C)  SpO2: 96%   There is no height or weight on file to calculate BMI. Physical Exam  Constitutional: She appears well-developed and well-nourished.  Cardiovascular: Normal rate, regular rhythm and normal heart sounds. Exam reveals decreased pulses.  Pulses:      Popliteal pulses are 1+ on the right side, and 1+ on the left side.       Dorsalis pedis pulses are 0 on the right side, and 0 on the left side.       Posterior tibial pulses are 0 on the right side, and 0 on the left side.  BLE cold to touch. Purple in color. ABI R=.66, L= .58.   Pulmonary/Chest: Effort normal and breath sounds normal.  Musculoskeletal: She exhibits edema (right wrist with brusing from the ulna area up to the mid forearm. No pain with palpation or rom). She exhibits no tenderness.  Neurological: She is alert.  Baseline dementia.   Skin: Skin is warm and dry. There is erythema.     Psychiatric:  Followed commands. Directable.  Nursing note and vitals reviewed.   Labs reviewed: Recent Labs    04/18/17 08/16/17 09/20/17 0300  NA 135* 137 139  K 5.2 4.5 4.1  BUN 22* 12 12  CREATININE 1.3* 0.9 0.8   Recent Labs    04/18/17 09/20/17 0300  AST 15 12*  ALT 11 8  ALKPHOS 90 114   Recent Labs    04/18/17 08/16/17 09/20/17 0300  WBC 12.5 11.3 14.3  HGB 17.8* 16.3* 16.0  HCT 57* 50* 50*  PLT 375 424* 483*   Lab Results  Component Value Date   TSH 3.30 07/19/2017   Lab Results  Component Value Date   HGBA1C  7.5 09/20/2017   Lab Results  Component Value Date   CHOL 173 08/27/2015   HDL 38 08/27/2015   LDLCALC 102 08/27/2015   TRIG 163 (A) 08/27/2015    Significant Diagnostic Results in last 30 days:  No results found.  Assessment/Plan 1. Dementia with behaviors Pt encouraged to be nice using language to express needs and to not use physical aggression with staff. NT caregiver changed to see if that helps with situation. Staff  encouraged to use empathetic language and state tasks they will complete prior to acting on them. Will increase the Vistaril prn to 25 mg BID prn for agitation if non pharm measures do not work. She will see Dr Casimiro Needle in March.    2. Diabetic ulcer of right heel associated with diabetes mellitus due to underlying condition, unspecified ulcer stage ([place]) The wound is tender leading to believe that it is diabetic or arterial related but it has been treated as a pressure injury as it did occur after a period of immobility and is located on the heel area. The infection portion has been treated and appears improved. The wound appears to need debridement. Will try santyl.    3. PVD Noted to BLE worse on the left She is not a candidate for any type of vascular consult as she is followed by hospice care.  She would not benefit from a statin Continue Plavix and aspirin at this time.   4. Right wrist bruising Significant bruising most likely due to plavix and asa but will check xray to ensure there is no fracture   Family/ staff Communication: LPN notified   Labs/tests ordered:  n/a

## 2017-12-26 ENCOUNTER — Non-Acute Institutional Stay (SKILLED_NURSING_FACILITY): Payer: Medicare Other | Admitting: Adult Health

## 2017-12-26 DIAGNOSIS — E1159 Type 2 diabetes mellitus with other circulatory complications: Secondary | ICD-10-CM

## 2017-12-26 DIAGNOSIS — I10 Essential (primary) hypertension: Secondary | ICD-10-CM

## 2017-12-26 DIAGNOSIS — G301 Alzheimer's disease with late onset: Secondary | ICD-10-CM | POA: Diagnosis not present

## 2017-12-26 DIAGNOSIS — L97521 Non-pressure chronic ulcer of other part of left foot limited to breakdown of skin: Secondary | ICD-10-CM

## 2017-12-26 DIAGNOSIS — R627 Adult failure to thrive: Secondary | ICD-10-CM

## 2017-12-26 DIAGNOSIS — I739 Peripheral vascular disease, unspecified: Secondary | ICD-10-CM

## 2017-12-26 DIAGNOSIS — F0281 Dementia in other diseases classified elsewhere with behavioral disturbance: Secondary | ICD-10-CM

## 2017-12-26 NOTE — Progress Notes (Signed)
Location:  [facility] of Service:  SNF (31) Provider:   Cindi Carbon, ANP [place] [phone]   Gayland Curry, DO  Patient Care Team: Gayland Curry, DO as PCP - General (Geriatric Medicine) Community, Well Orpah Greek, MD as Consulting Physician (Gastroenterology) Lorretta Harp, MD as Consulting Physician (Cardiology) Garvin Fila, MD as Consulting Physician (Neurology) Bjorn Loser, MD as Consulting Physician (Urology) Pedro Earls, MD as Attending Physician [facility])  Extended Emergency Contact Information Primary Emergency Contact: Allena Earing [place] of [place] Phone: [phone] Relation: Friend Secondary Emergency Contact: [facility]  [place] Phone: [phone] Relation: Other  Code Status:  DNR Goals of care: Advanced Directive information Advanced Directives 11/15/2017  Does Patient Have a Medical Advance Directive? Yes  Type of Advance Directive Out of facility DNR (pink MOST or yellow form);[place];Living will  Does patient want to make changes to medical advance directive? No - Patient declined  Copy of [place] in Chart? Yes  Pre-existing out of facility DNR order (yellow form or pink MOST form) Yellow form placed in chart (order not valid for inpatient use);Pink MOST form placed in chart (order not valid for inpatient use)     Chief Complaint  Patient presents with  . Medical Management of Chronic Issues    HPI:  Pt is a 84 y.o. female seen today for medical management of chronic diseases.  She resides in skilled care and is followed by hospice due to progressive dementia and weight loss.   Dementia with behavioral disturbances: improved per the nurse with the use of depakote. Ativan has been used 5 x prn for agitation in the month of March.    The nurse reports painful  ulceration to the tip of the left toe. The nurse used roxanol this morning for reported pain in her feet and this seems to help. The nurse feels the roxanol helps with her behaviors more than the ativan.   PVD: Right leg 0.66 left leg 0.58 ABI Not ambulatory, no calf pain, mainly foot pain as above  Weight loss: glucerna is offered once a day and unjury protein powder each afternoon. At times she does not finish these doses and tends to eat small amts 25-50% meals  Wt Readings from Last 3 Encounters:  12/26/17 131 lb 11.2 oz (59.7 kg)  11/15/17 138 lb (62.6 kg)  11/14/17 138 lb 3.2 oz (62.7 kg)   DMII: CBGs range 162-225 fasting, currently off medications Lab Results  Component Value Date   HGBA1C 7.5 09/20/2017     Functional status: hoyer lift for transfers and incontinent   Past Medical History:  Diagnosis Date  . Abnormality of gait 11/18/2008  . Anxiety   . Basilar artery syndrome   . Closed fracture of intertrochanteric section of femur ([place]) 03/04/2010  . Corns and callosities 02/05/2008  . Depressive disorder   . Diabetes mellitus   . Diarrhea 12/13/2012  . Dysphagia   . Edema 10/25/2007  . Fecal smearing 04/05/2012  . Hyperlipidemia   . Hypertension   . Insomnia   . Macular degeneration (senile) of retina, unspecified 03/17/2011  . Neuropathy   . Osteoporosis, senile   . Other atopic dermatitis and related conditions 12/13/2012  . Pain in joint, ankle and foot 08/07/2008  . Pain in joint, pelvic region and thigh 10/2008  . Pain in limb 11/18/2008  . Paroxysmal atrial tachycardia (  [place])   . Peripheral vascular disease, unspecified ([place]) 08/14/2012  . Plantar fascial fibromatosis   . Rash and other nonspecific skin eruption 12/21/2010  . Rickets, active   . Spinal stenosis, unspecified region other than cervical 11/18/2008  . TIA (transient ischemic attack)   . Transient ischemic attack (TIA), and cerebral infarction without residual deficits(V12.54) 10/28/2002  . Type II or  unspecified type diabetes mellitus with peripheral circulatory disorders, uncontrolled(250.72) 02/12/2013  . Unspecified hereditary and idiopathic peripheral neuropathy 03/17/2011  . Unspecified urinary incontinence 05/13/2008  . Venous insufficiency   . Vitamin D deficiency 11/07/2007   Past Surgical History:  Procedure Laterality Date  . BREAST MASS EXCISION    . CHOLECYSTECTOMY  1964  . COLONOSCOPY  12/18/2010   Dr Oletta Lamas  . DILATION AND CURETTAGE OF UTERUS  2003   post menopausal bleeding. Bicornate uterus/doble cervix, benign path  . ESOPHAGOGASTRODUODENOSCOPY (EGD) WITH PROPOFOL N/A 11/07/2015   Procedure: ESOPHAGOGASTRODUODENOSCOPY (EGD) WITH PROPOFOL;  Surgeon: Carol Ada, MD;  Location: [facility];  Service: Endoscopy;  Laterality: N/A;  . ESOPHAGOGASTRODUODENOSCOPY ENDOSCOPY  12/18/2010   Dr. Oletta Lamas slight gastritis  . HIP FRACTURE SURGERY Right 02/2010  . INCISION / DRAINAGE HAND / FINGER  2001   cat bite  . TONSILLECTOMY AND ADENOIDECTOMY     as a child    Allergies  Allergen Reactions  . Codeine     UNKNOWN  . Tramadol     UNKNOWN  . Vesicare [Solifenacin Succinate]     UNKNOWN    Outpatient Encounter Medications as of 12/26/2017  Medication Sig  . aspirin EC 81 MG tablet Take 81 mg by mouth daily.  Marland Kitchen atenolol (TENORMIN) 25 MG tablet Take 12.5 mg by mouth every morning.   . clobetasol cream (TEMOVATE) 5.91 % 1 application daily. To bilateral legs from the Knee down every day before compression stockings applied Monday thur Friday  . clopidogrel (PLAVIX) 75 MG tablet Take 75 mg by mouth daily.  . Emollient (EUCERIN) lotion Apply 1 mL topically as needed for dry skin.  Marland Kitchen gabapentin (NEURONTIN) 100 MG capsule Take 100 mg by mouth 2 (two) times daily. 100 mg in the am and 200 mg in the pm  . hydrOXYzine (ATARAX/VISTARIL) 10 MG tablet Take 10 mg by mouth 2 (two) times daily as needed (agitation).  Marland Kitchen loperamide (IMODIUM) 2 MG capsule Take 2 mg by mouth as needed for  diarrhea or loose stools.  . nystatin cream (MYCOSTATIN) Apply 1 application topically 2 (two) times daily as needed.   . pantoprazole (PROTONIX) 40 MG tablet Take 1 tablet (40 mg total) by mouth 2 (two) times daily before a meal.  . sertraline (ZOLOFT) 100 MG tablet Take 200 mg by mouth daily.  Marland Kitchen spironolactone (ALDACTONE) 25 MG tablet Take 25 mg by mouth daily.  . [DISCONTINUED] HYDROcodone-acetaminophen (NORCO/VICODIN) 5-325 MG tablet Take 1 tablet by mouth every 6 (six) hours as needed for moderate pain.  . [DISCONTINUED] mirtazapine (REMERON) 30 MG tablet Take 30 mg by mouth at bedtime.   No facility-administered encounter medications on file as of 12/26/2017.     Review of Systems  Constitutional: Positive for activity change, appetite change, fatigue and unexpected weight change. Negative for chills, diaphoresis and fever.  HENT: Negative for congestion.   Respiratory: Negative for cough, shortness of breath and wheezing.   Cardiovascular: Positive for leg swelling. Negative for chest pain and palpitations.  Gastrointestinal: Negative for abdominal distention, abdominal pain, constipation and diarrhea.  Genitourinary: Negative for  difficulty urinating and dysuria.  Musculoskeletal: Positive for arthralgias and gait problem. Negative for back pain, joint swelling and myalgias.  Skin: Positive for wound.  Neurological: Positive for weakness. Negative for dizziness, tremors, seizures, syncope, facial asymmetry, speech difficulty, light-headedness, numbness and headaches.  Psychiatric/Behavioral: Positive for agitation, behavioral problems and confusion.    Immunization History  Administered Date(s) Administered  . Influenza Inj Mdck Quad Pf 08/05/2016  . Influenza Whole 08/01/2013  . Influenza-Unspecified 08/01/2014, 07/31/2015, 08/05/2016, 10/19/2017  . PPD Test 03/09/2012  . Pneumococcal Conjugate-13 03/04/2015  . Pneumococcal Polysaccharide-23 10/18/1998  . Td 11/04/2011    Pertinent  Health Maintenance Due  Topic Date Due  . OPHTHALMOLOGY EXAM  11/04/2018 (Originally 04/23/2017)  . HEMOGLOBIN A1C  03/21/2018  . FOOT EXAM  04/14/2018  . URINE MICROALBUMIN  04/18/2018  . INFLUENZA VACCINE  Completed  . DEXA SCAN  Completed  . PNA vac Low Risk Adult  Completed   Fall Risk  03/10/2016 09/10/2015 09/02/2014 08/29/2013  Falls in the past year? No No No No  Risk for fall due to : - History of fall(s);Impaired balance/gait;Impaired mobility;Mental status change - -   Functional Status Survey:    Vitals:   12/26/17 1132  BP: 111/67  Pulse: 63  Resp: 20  Temp: 98 F (36.7 C)  SpO2: 98%  Weight: 131 lb 11.2 oz (59.7 kg)   Body mass index is 20.63 kg/m. Physical Exam  Constitutional: No distress.  HENT:  Head: Normocephalic and atraumatic.  Mouth/Throat: No oropharyngeal exudate.  Poor dentition  Neck: No JVD present.  Cardiovascular: Normal rate.  No murmur heard. Irregular, trace edema to both feet.  Able to palpate +1 pedal pulse on the left, no pedal pulse on the right.  Pulmonary/Chest: Effort normal.  Abdominal: Soft. Bowel sounds are normal.  Musculoskeletal:  kyphosis  Lymphadenopathy:    She has no cervical adenopathy.  Neurological: She is alert.  Oriented to self and able to f/c  Skin: Skin is warm and dry. She is not diaphoretic.  Left tip of toe with painful closed ulceration. Right tip of toe with redness that is tender and blanches. Left heal pink that blanches. Right heel necrotic ulceration peeling on the sides, reducing in size.   Psychiatric: She has a normal mood and affect.  Vitals reviewed.   Labs reviewed: Recent Labs    04/18/17 08/16/17 09/20/17 0300  NA 135* 137 139  K 5.2 4.5 4.1  BUN 22* 12 12  CREATININE 1.3* 0.9 0.8   Recent Labs    04/18/17 09/20/17 0300  AST 15 12*  ALT 11 8  ALKPHOS 90 114   Recent Labs    04/18/17 08/16/17 09/20/17 0300  WBC 12.5 11.3 14.3  HGB 17.8* 16.3* 16.0  HCT 57*  50* 50*  PLT 375 424* 483*   Lab Results  Component Value Date   TSH 3.30 07/19/2017   Lab Results  Component Value Date   HGBA1C 7.5 09/20/2017   Lab Results  Component Value Date   CHOL 173 08/27/2015   HDL 38 08/27/2015   LDLCALC 102 08/27/2015   TRIG 163 (A) 08/27/2015    Significant Diagnostic Results in last 30 days:  No results found.  Assessment/Plan  1. Skin ulcer of toe of left foot, limited to breakdown of skin (HCC) Painful to palpation She continues to wear heel supports to prevent breakdown. The wound is closed and does not need a dressing. It is painful at the tip of the  toe and therefore may represent an arterial ulcer, but there is also a hx of diabetes. Will continue to monitor but no aggressive care as she is followed by hospice.    2. Peripheral vascular disease, unspecified ([place]) Remains on plavix and aspirin BP controlled No statin therapy due to goals of care/hospice  3. Type 2 diabetes mellitus with other circulatory complication, without long-term current use of insulin (HCC) Controlled. A1C goal <8% She is not currently on medications. Would not be aggressive due to poor appetite and weight loss  4. Late onset Alzheimer's disease with behavioral disturbance Progressive decline cognition and function Behaviors are improved with Depakote   5. Essential hypertension, benign Controlled Continue atenolol and aldactone   6. Adult failure to thrive Continues to lose weight despite supplementation with unjury and glucerna   Family/ staff Communication: discussed with staff and resident  Labs/tests ordered:  NA

## 2017-12-28 ENCOUNTER — Encounter: Payer: Self-pay | Admitting: Adult Health

## 2018-01-05 ENCOUNTER — Other Ambulatory Visit: Payer: Self-pay | Admitting: Internal Medicine

## 2018-01-05 DIAGNOSIS — L97521 Non-pressure chronic ulcer of other part of left foot limited to breakdown of skin: Secondary | ICD-10-CM

## 2018-01-05 MED ORDER — MORPHINE SULFATE (CONCENTRATE) 20 MG/ML PO SOLN: 5.0000 mg | Freq: Two times a day (BID) | ORAL | 0 refills | Status: DC | PRN

## 2018-01-05 NOTE — Progress Notes (Signed)
Received email from day nurse manager that script needed for roxanol--faxed e-script to [place].

## 2018-02-06 ENCOUNTER — Non-Acute Institutional Stay (SKILLED_NURSING_FACILITY): Payer: Medicare Other | Admitting: Adult Health

## 2018-02-06 ENCOUNTER — Encounter: Payer: Self-pay | Admitting: Adult Health

## 2018-02-06 DIAGNOSIS — K922 Gastrointestinal hemorrhage, unspecified: Secondary | ICD-10-CM

## 2018-02-06 NOTE — Progress Notes (Signed)
Location:  [facility] Room Number: 128 Place of Service:  SNF [phone]) Provider:  Algis Greenhouse, RN, AGPCNP-Student/ Royal Hawthorn, ANP-BC   Gayland Curry, DO  Patient Care Team: Gayland Curry, DO as PCP - General (Geriatric Medicine) Community, Well Orpah Greek, MD as Consulting Physician (Gastroenterology) Lorretta Harp, MD as Consulting Physician (Cardiology) Garvin Fila, MD as Consulting Physician (Neurology) Bjorn Loser, MD as Consulting Physician (Urology) Pedro Earls, MD as Attending Physician (Family Medicine)  Extended Emergency Contact Information Primary Emergency Contact: Allena Earing [place] Phone: [phone] Relation: Friend Secondary Emergency Contact: [facility]  [place] Phone: [phone] Relation: Other  Code Status:  DNR  Goals of care: Advanced Directive information Advanced Directives 11/15/2017  Does Patient Have a Medical Advance Directive? Yes  Type of Advance Directive Out of facility DNR (pink MOST or yellow form);[place];Living will  Does patient want to make changes to medical advance directive? No - Patient declined  Copy of [place] in Chart? Yes  Pre-existing out of facility DNR order (yellow form or pink MOST form) Yellow form placed in chart (order not valid for inpatient use);Pink MOST form placed in chart (order not valid for inpatient use)     Chief Complaint  Patient presents with  . Hematochezia    HPI:  Pt is a 84 y.o. female seen today for an acute visit for hematochezia. Frank red blood was noticied in her depends per staff for the first time this morning. She denies any abd pain, nausea, vomiting, or fever. She has a history of a upper GIB in 2015 in which she was hospitalized. Endoscopies in 2012 (gastritis) and 2017 (benign polyps). Aspirin, Plavix, and  Atenolol were all stopped on 4/1 as she is followed by hospice.      Past Medical History:  Diagnosis Date  . Abnormality of gait 11/18/2008  . Anxiety   . Basilar artery syndrome   . Closed fracture of intertrochanteric section of femur ([place]) 03/04/2010  . Corns and callosities 02/05/2008  . Depressive disorder   . Diabetes mellitus   . Diarrhea 12/13/2012  . Dysphagia   . Edema 10/25/2007  . Fecal smearing 04/05/2012  . Hyperlipidemia   . Hypertension   . Insomnia   . Macular degeneration (senile) of retina, unspecified 03/17/2011  . Neuropathy   . Osteoporosis, senile   . Other atopic dermatitis and related conditions 12/13/2012  . Pain in joint, ankle and foot 08/07/2008  . Pain in joint, pelvic region and thigh 10/2008  . Pain in limb 11/18/2008  . Paroxysmal atrial tachycardia ([place])   . Peripheral vascular disease, unspecified ([place]) 08/14/2012  . Plantar fascial fibromatosis   . Rash and other nonspecific skin eruption 12/21/2010  . Rickets, active   . Spinal stenosis, unspecified region other than cervical 11/18/2008  . TIA (transient ischemic attack)   . Transient ischemic attack (TIA), and cerebral infarction without residual deficits(V12.54) 10/28/2002  . Type II or unspecified type diabetes mellitus with peripheral circulatory disorders, uncontrolled(250.72) 02/12/2013  . Unspecified hereditary and idiopathic peripheral neuropathy 03/17/2011  . Unspecified urinary incontinence 05/13/2008  . Venous insufficiency   . Vitamin D deficiency 11/07/2007   Past Surgical History:  Procedure Laterality Date  . BREAST MASS EXCISION    . CHOLECYSTECTOMY  1964  . COLONOSCOPY  12/18/2010   Dr Oletta Lamas  . [place] OF UTERUS  2003  post menopausal bleeding. Bicornate uterus/doble cervix, benign path  . ESOPHAGOGASTRODUODENOSCOPY (EGD) WITH PROPOFOL N/A 11/07/2015   Procedure: ESOPHAGOGASTRODUODENOSCOPY (EGD) WITH PROPOFOL;  Surgeon: Carol Ada, MD;  Location: [facility];   Service: Endoscopy;  Laterality: N/A;  . ESOPHAGOGASTRODUODENOSCOPY ENDOSCOPY  12/18/2010   Dr. Oletta Lamas slight gastritis  . HIP FRACTURE SURGERY Right 02/2010  . INCISION / DRAINAGE HAND / FINGER  2001   cat bite  . TONSILLECTOMY AND ADENOIDECTOMY     as a child    Allergies  Allergen Reactions  . Codeine     UNKNOWN  . Tramadol     UNKNOWN  . Vesicare [Solifenacin Succinate]     UNKNOWN    Outpatient Encounter Medications as of 02/06/2018  Medication Sig  . clobetasol cream (TEMOVATE) 8.18 % 1 application daily. To bilateral legs from the Knee down every day before compression stockings applied Monday thur Friday  . Emollient (EUCERIN) lotion Apply 1 mL topically as needed for dry skin.  Marland Kitchen loperamide (IMODIUM) 2 MG capsule Take 2 mg by mouth as needed for diarrhea or loose stools.  Marland Kitchen LORazepam (ATIVAN) 0.5 MG tablet Take 0.5 mg by mouth at bedtime. qhs and q 4 hrs prn. May use gel if not able to swallow pill  . morphine (ROXANOL) 20 MG/ML concentrated solution Take 0.25 mLs (5 mg total) by mouth 2 (two) times daily as needed for severe pain. In am and at 1600  . nystatin cream (MYCOSTATIN) Apply 1 application topically 2 (two) times daily as needed.   . pantoprazole (PROTONIX) 40 MG tablet Take 1 tablet (40 mg total) by mouth 2 (two) times daily before a meal. (Patient taking differently: Take 40 mg by mouth daily. )  . sertraline (ZOLOFT) 20 MG/ML concentrated solution Take 200 mg by mouth daily. Admin 10 mL PO every morning for depression.  Marland Kitchen spironolactone (ALDACTONE) 25 MG tablet Take 25 mg by mouth daily.  . Valproate Sodium (DEPAKENE) 250 MG/5ML SOLN solution Take 250 mg by mouth 2 (two) times daily. Admin 2.5 mL PO BID for agitation/combative behaviors.  Marland Kitchen aspirin EC 81 MG tablet Take 81 mg by mouth daily.  Marland Kitchen atenolol (TENORMIN) 25 MG tablet Take 12.5 mg by mouth every morning.   . clopidogrel (PLAVIX) 75 MG tablet Take 75 mg by mouth daily.  . divalproex (DEPAKOTE SPRINKLE)  125 MG capsule Take 125 mg by mouth 2 (two) times daily.  Marland Kitchen gabapentin (NEURONTIN) 100 MG capsule Take 200 mg by mouth daily.   . hydrOXYzine (ATARAX/VISTARIL) 10 MG tablet Take 10 mg by mouth 2 (two) times daily as needed (agitation).  . sertraline (ZOLOFT) 100 MG tablet Take 200 mg by mouth daily.   No facility-administered encounter medications on file as of 02/06/2018.     Review of Systems  Constitutional: Negative for activity change, appetite change, fever and unexpected weight change.  Respiratory: Negative.  Negative for shortness of breath.   Cardiovascular: Negative.   Gastrointestinal: Positive for blood in stool. Negative for abdominal distention, abdominal pain, constipation, diarrhea, nausea and vomiting.  Genitourinary: Negative for difficulty urinating, dysuria and hematuria.  Skin: Negative.   Neurological: Negative for dizziness and light-headedness.    Immunization History  Administered Date(s) Administered  . Influenza Inj Mdck Quad Pf 08/05/2016  . Influenza Whole 08/01/2013  . Influenza-Unspecified 08/01/2014, 07/31/2015, 08/05/2016, 10/19/2017  . PPD Test 03/09/2012  . Pneumococcal Conjugate-13 03/04/2015  . Pneumococcal Polysaccharide-23 10/18/1998  . Td 11/04/2011   Pertinent  Health Maintenance Due  Topic Date Due  . OPHTHALMOLOGY EXAM  11/04/2018 (Originally 04/23/2017)  . HEMOGLOBIN A1C  03/21/2018  . FOOT EXAM  04/14/2018  . URINE MICROALBUMIN  04/18/2018  . INFLUENZA VACCINE  05/18/2018  . DEXA SCAN  Completed  . PNA vac Low Risk Adult  Completed   Fall Risk  03/10/2016 09/10/2015 09/02/2014 08/29/2013  Falls in the past year? No No No No  Risk for fall due to : - History of fall(s);Impaired balance/gait;Impaired mobility;Mental status change - -   Functional Status Survey:    Vitals:   02/06/18 1032  BP: 131/83  Pulse: 83  Resp: 20  Temp: 97.9 F (36.6 C)  SpO2: 97%   There is no height or weight on file to calculate BMI. Physical  Exam  Constitutional: She appears well-developed and well-nourished.  Cardiovascular: Normal rate, regular rhythm, normal heart sounds and intact distal pulses.  Pulmonary/Chest: Effort normal and breath sounds normal.  Abdominal: Soft. Normal appearance and bowel sounds are normal. She exhibits no distension. There is no tenderness. There is no rigidity, no rebound and no guarding.  Genitourinary: Rectal exam shows tenderness. Rectal exam shows no external hemorrhoid, no internal hemorrhoid and anal tone normal.  Neurological: She is alert. She is disoriented.  Skin: Skin is warm, dry and intact.  Psychiatric: Cognition and memory are impaired. She exhibits abnormal recent memory and abnormal remote memory.  Baseline cognitive impairment   Nursing note and vitals reviewed.   Labs reviewed: Recent Labs    04/18/17 08/16/17 09/20/17 0300  NA 135* 137 139  K 5.2 4.5 4.1  BUN 22* 12 12  CREATININE 1.3* 0.9 0.8   Recent Labs    04/18/17 09/20/17 0300  AST 15 12*  ALT 11 8  ALKPHOS 90 114   Recent Labs    04/18/17 08/16/17 09/20/17 0300  WBC 12.5 11.3 14.3  HGB 17.8* 16.3* 16.0  HCT 57* 50* 50*  PLT 375 424* 483*   Lab Results  Component Value Date   TSH 3.30 07/19/2017   Lab Results  Component Value Date   HGBA1C 7.5 09/20/2017   Lab Results  Component Value Date   CHOL 173 08/27/2015   HDL 38 08/27/2015   LDLCALC 102 08/27/2015   TRIG 163 (A) 08/27/2015    Significant Diagnostic Results in last 30 days:  No results found.  Assessment/Plan 1. Lower GI bleed Unclear etiology, appears well and in no acute distress Hospice care; therefore, no CT, referral to GI, or labs indicated.  Hemorrhoid suppository PR QD PRN for rectal discomfort ordered.     Family/ staff Communication: LPN notified   Labs/tests ordered:  n/a

## 2018-02-14 ENCOUNTER — Non-Acute Institutional Stay (SKILLED_NURSING_FACILITY): Payer: Medicare Other | Admitting: Internal Medicine

## 2018-02-14 ENCOUNTER — Encounter: Payer: Self-pay | Admitting: Internal Medicine

## 2018-02-14 DIAGNOSIS — G301 Alzheimer's disease with late onset: Secondary | ICD-10-CM | POA: Diagnosis not present

## 2018-02-14 DIAGNOSIS — F4321 Adjustment disorder with depressed mood: Secondary | ICD-10-CM

## 2018-02-14 DIAGNOSIS — F0281 Dementia in other diseases classified elsewhere with behavioral disturbance: Secondary | ICD-10-CM

## 2018-02-14 DIAGNOSIS — R627 Adult failure to thrive: Secondary | ICD-10-CM | POA: Diagnosis not present

## 2018-02-14 DIAGNOSIS — R634 Abnormal weight loss: Secondary | ICD-10-CM

## 2018-02-14 DIAGNOSIS — E1159 Type 2 diabetes mellitus with other circulatory complications: Secondary | ICD-10-CM

## 2018-02-14 DIAGNOSIS — F02818 Dementia in other diseases classified elsewhere, unspecified severity, with other behavioral disturbance: Secondary | ICD-10-CM

## 2018-02-14 DIAGNOSIS — D471 Chronic myeloproliferative disease: Secondary | ICD-10-CM

## 2018-02-14 NOTE — Progress Notes (Signed)
Patient ID: Kara Harris, female   DOB: [birth year], 84 y.o.   MRN: [id number]  Location:  [facility] Room Number: 128 Place of Service:  SNF [phone]) Provider:   Gayland Curry, DO  Patient Care Team: Gayland Curry, DO as PCP - General (Geriatric Medicine) Community, Well Orpah Greek, MD as Consulting Physician (Gastroenterology) Lorretta Harp, MD as Consulting Physician (Cardiology) Garvin Fila, MD as Consulting Physician (Neurology) Bjorn Loser, MD as Consulting Physician (Urology) Pedro Earls, MD as Attending Physician (Family Medicine)  Extended Emergency Contact Information Primary Emergency Contact: Allena Earing [place] Phone: [phone] Relation: Friend Secondary Emergency Contact: [facility]  [place] Phone: [phone] Relation: Other  Code Status:  DNR, hospice Goals of care: Advanced Directive information Advanced Directives 02/14/2018  Does Patient Have a Medical Advance Directive? Yes  Type of Advance Directive Out of facility DNR (pink MOST or yellow form);[place];Living will  Does patient want to make changes to medical advance directive? No - Patient declined  Copy of [place] in Chart? Yes  Pre-existing out of facility DNR order (yellow form or pink MOST form) Yellow form placed in chart (order not valid for inpatient use);Pink MOST form placed in chart (order not valid for inpatient use)     Chief Complaint  Patient presents with  . Medical Management of Chronic Issues    Routine visit    HPI:  Pt is a 84 y.o. female seen today for medical management of chronic diseases--she is on hospice care due to her myeloproliferative disorder that was progressing and causing cachexia and generalized debility.  She's had significant cognitive and functional decline.  She is doing better since meds  are being given in liquid or as liquid form.  She gets some in OJ.  She is no longer hitting or cussing, but remains spunky with her attitude.  She continues to spend time in the gerichair with her artificial cat that purs and meows.  Her protonix was changed to omeprazole to allow capsules to be opened.     Past Medical History:  Diagnosis Date  . Abnormality of gait 11/18/2008  . Anxiety   . Basilar artery syndrome   . Closed fracture of intertrochanteric section of femur ([place]) 03/04/2010  . Corns and callosities 02/05/2008  . Depressive disorder   . Diabetes mellitus   . Diarrhea 12/13/2012  . Dysphagia   . Edema 10/25/2007  . Fecal smearing 04/05/2012  . Hyperlipidemia   . Hypertension   . Insomnia   . Macular degeneration (senile) of retina, unspecified 03/17/2011  . Neuropathy   . Osteoporosis, senile   . Other atopic dermatitis and related conditions 12/13/2012  . Pain in joint, ankle and foot 08/07/2008  . Pain in joint, pelvic region and thigh 10/2008  . Pain in limb 11/18/2008  . Paroxysmal atrial tachycardia ([place])   . Peripheral vascular disease, unspecified ([place]) 08/14/2012  . Plantar fascial fibromatosis   . Rash and other nonspecific skin eruption 12/21/2010  . Rickets, active   . Spinal stenosis, unspecified region other than cervical 11/18/2008  . TIA (transient ischemic attack)   . Transient ischemic attack (TIA), and cerebral infarction without residual deficits(V12.54) 10/28/2002  . Type II or unspecified type diabetes mellitus with peripheral circulatory disorders, uncontrolled(250.72) 02/12/2013  . Unspecified hereditary and idiopathic peripheral neuropathy 03/17/2011  . Unspecified urinary incontinence 05/13/2008  . Venous insufficiency   .  Vitamin D deficiency 11/07/2007   Past Surgical History:  Procedure Laterality Date  . BREAST MASS EXCISION    . CHOLECYSTECTOMY  1964  . COLONOSCOPY  12/18/2010   Dr Oletta Lamas  . DILATION AND CURETTAGE OF UTERUS  2003   post menopausal  bleeding. Bicornate uterus/doble cervix, benign path  . ESOPHAGOGASTRODUODENOSCOPY (EGD) WITH PROPOFOL N/A 11/07/2015   Procedure: ESOPHAGOGASTRODUODENOSCOPY (EGD) WITH PROPOFOL;  Surgeon: Carol Ada, MD;  Location[facility];  Service: Endoscopy;  Laterality: N/A;  . ESOPHAGOGASTRODUODENOSCOPY ENDOSCOPY  12/18/2010   Dr. Oletta Lamas slight gastritis  . HIP FRACTURE SURGERY Right 02/2010  . INCISION / DRAINAGE HAND / FINGER  2001   cat bite  . TONSILLECTOMY AND ADENOIDECTOMY     as a child    Allergies  Allergen Reactions  . Codeine     UNKNOWN  . Tramadol     UNKNOWN  . Vesicare [Solifenacin Succinate]     UNKNOWN    Outpatient Encounter Medications as of 02/14/2018  Medication Sig  . clobetasol cream (TEMOVATE) 7.34 % 1 application daily. To bilateral legs from the Knee down every day before compression stockings applied Monday thur Friday  . Emollient (EUCERIN) lotion Apply 1 mL topically as needed for dry skin.  Marland Kitchen loperamide (IMODIUM) 2 MG capsule Take 2 mg by mouth as needed for diarrhea or loose stools.  Marland Kitchen LORazepam (ATIVAN) 0.5 MG tablet Take 0.5 mg by mouth at bedtime. qhs and q 4 hrs prn. May use gel if not able to swallow pill  . morphine (ROXANOL) 20 MG/ML concentrated solution Take 0.25 mLs (5 mg total) by mouth 2 (two) times daily as needed for severe pain. In am and at 1600  . nystatin cream (MYCOSTATIN) Apply 1 application topically 2 (two) times daily as needed.   Marland Kitchen omeprazole (PRILOSEC) 40 MG capsule Take 40 mg by mouth daily.  . phenylephrine (,USE FOR PREPARATION-H,) 0.25 % suppository Place 1 suppository rectally as needed for hemorrhoids.  . sertraline (ZOLOFT) 20 MG/ML concentrated solution Take 10 mg by mouth daily.   Marland Kitchen spironolactone (ALDACTONE) 25 MG tablet Take 25 mg by mouth daily.  . Valproate Sodium (DEPAKENE) 250 MG/5ML SOLN solution Take 250 mg by mouth 2 (two) times daily. for agitation/combative behaviors.  . [DISCONTINUED] aspirin EC 81 MG tablet Take  81 mg by mouth daily.  . [DISCONTINUED] atenolol (TENORMIN) 25 MG tablet Take 12.5 mg by mouth every morning.   . [DISCONTINUED] clopidogrel (PLAVIX) 75 MG tablet Take 75 mg by mouth daily.  . [DISCONTINUED] divalproex (DEPAKOTE SPRINKLE) 125 MG capsule Take 125 mg by mouth 2 (two) times daily.  . [DISCONTINUED] gabapentin (NEURONTIN) 100 MG capsule Take 200 mg by mouth daily.   . [DISCONTINUED] hydrOXYzine (ATARAX/VISTARIL) 10 MG tablet Take 10 mg by mouth 2 (two) times daily as needed (agitation).  . [DISCONTINUED] pantoprazole (PROTONIX) 40 MG tablet Take 1 tablet (40 mg total) by mouth 2 (two) times daily before a meal. (Patient taking differently: Take 40 mg by mouth daily. )  . [DISCONTINUED] sertraline (ZOLOFT) 100 MG tablet Take 200 mg by mouth daily.   No facility-administered encounter medications on file as of 02/14/2018.     Review of Systems  Constitutional: Positive for activity change, appetite change and unexpected weight change. Negative for chills and fever.  HENT: Negative for congestion.   Respiratory: Negative for chest tightness and shortness of breath.   Cardiovascular: Negative for chest pain, palpitations and leg swelling.  Gastrointestinal: Negative for constipation.  Genitourinary: Negative for dysuria.  Musculoskeletal: Positive for arthralgias and gait problem.  Skin: Positive for color change.  Neurological: Positive for weakness. Negative for dizziness.  Hematological: Bruises/bleeds easily.  Psychiatric/Behavioral: Positive for agitation, behavioral problems and confusion. Negative for sleep disturbance and suicidal ideas. The patient is not nervous/anxious.     Immunization History  Administered Date(s) Administered  . Influenza Inj Mdck Quad Pf 08/05/2016  . Influenza Whole 08/01/2013  . Influenza-Unspecified 08/01/2014, 07/31/2015, 08/05/2016, 10/19/2017  . PPD Test 03/09/2012  . Pneumococcal Conjugate-13 03/04/2015  . Pneumococcal Polysaccharide-23  10/18/1998  . Td 11/04/2011   Pertinent  Health Maintenance Due  Topic Date Due  . OPHTHALMOLOGY EXAM  11/04/2018 (Originally 04/23/2017)  . HEMOGLOBIN A1C  03/21/2018  . FOOT EXAM  04/14/2018  . URINE MICROALBUMIN  04/18/2018  . INFLUENZA VACCINE  05/18/2018  . DEXA SCAN  Completed  . PNA vac Low Risk Adult  Completed   Fall Risk  03/10/2016 09/10/2015 09/02/2014 08/29/2013  Falls in the past year? No No No No  Risk for fall due to : - History of fall(s);Impaired balance/gait;Impaired mobility;Mental status change - -   Functional Status Survey:    Vitals:   02/14/18 1200  BP: (!) 148/78  Pulse: 81  Resp: 20  Temp: (!) 97.4 F (36.3 C)  TempSrc: Oral  SpO2: 95%  Weight: 132 lb (59.9 kg)  Height: 5\' 7"  (1.702 m)   Body mass index is 20.67 kg/m. Physical Exam  Constitutional: No distress.  Cachectic appearing, frail female in gerichair  HENT:  Head: Normocephalic and atraumatic.  Eyes: Pupils are equal, round, and reactive to light. EOM are normal.  glasses  Cardiovascular: Normal rate, regular rhythm, normal heart sounds and intact distal pulses.  Pulmonary/Chest: Effort normal and breath sounds normal. No respiratory distress.  Abdominal: Soft. Bowel sounds are normal.  Musculoskeletal: Normal range of motion.  Neurological: She is alert.  Still conversant, but with more difficulty with word-finding and confusion   Skin:  Has had skin breakdown of heels and in supportive boots  Psychiatric:  Affect fluctuates, down today    Labs reviewed: Recent Labs    04/18/17 08/16/17 09/20/17 0300  NA 135* 137 139  K 5.2 4.5 4.1  BUN 22* 12 12  CREATININE 1.3* 0.9 0.8   Recent Labs    04/18/17 09/20/17 0300  AST 15 12*  ALT 11 8  ALKPHOS 90 114   Recent Labs    04/18/17 08/16/17 09/20/17 0300  WBC 12.5 11.3 14.3  HGB 17.8* 16.3* 16.0  HCT 57* 50* 50*  PLT 375 424* 483*   Lab Results  Component Value Date   TSH 3.30 07/19/2017   Lab Results  Component  Value Date   HGBA1C 7.5 09/20/2017   Lab Results  Component Value Date   CHOL 173 08/27/2015   HDL 38 08/27/2015   LDLCALC 102 08/27/2015   TRIG 163 (A) 08/27/2015    Assessment/Plan 1. Myeloproliferative disorder ([place]) -cause for decline overall -cont supportive care with hospice per resident's goals of care and MOST form -cont comfort meds for pain with roxanol and ativan for anxiety at bedtime  2. Type 2 diabetes mellitus with other circulatory complication, without long-term current use of insulin ([facility]) -no longer on meds for her diabetes with her poor and irregular po intake and risk of hypoglycemia  3. Late onset Alzheimer's disease with behavioral disturbance -more recent issue over past year -is now on depakote for behaviors related to this  4. Adjustment disorder with depressed mood -cont zoloft solution for mood, this and the depakote have helped her spirits  5. Adult failure to thrive -ongoing due to her hematologic disease, cont to encourage po as tolerates  6. Weight loss Wt Readings from Last 3 Encounters:  02/14/18 132 lb (59.9 kg)  12/26/17 131 lb 11.2 oz (59.7 kg)  11/15/17 138 lb (62.6 kg)  -again, cont to allow her to eat what she will and what she enjoys  Pharmacist, hospital Communication: discussed with snf nurse  Labs/tests ordered:  No new due to goals of care  Marquail Bradwell L. Cherokee Boccio, D.O. [facility] [address], [place] [address] Cell Phone (Mon-Fri 8am-5pm):  [phone] On Call:  [phone] & follow prompts after 5pm & weekends Office Phone:  [phone] Office Fax:  [phone]

## 2018-03-09 ENCOUNTER — Encounter: Payer: Self-pay | Admitting: Adult Health

## 2018-03-09 ENCOUNTER — Non-Acute Institutional Stay (SKILLED_NURSING_FACILITY): Payer: Medicare Other | Admitting: Adult Health

## 2018-03-09 DIAGNOSIS — I872 Venous insufficiency (chronic) (peripheral): Secondary | ICD-10-CM | POA: Diagnosis not present

## 2018-03-09 DIAGNOSIS — G301 Alzheimer's disease with late onset: Secondary | ICD-10-CM

## 2018-03-09 DIAGNOSIS — F0281 Dementia in other diseases classified elsewhere with behavioral disturbance: Secondary | ICD-10-CM | POA: Diagnosis not present

## 2018-03-09 DIAGNOSIS — F02818 Dementia in other diseases classified elsewhere, unspecified severity, with other behavioral disturbance: Secondary | ICD-10-CM

## 2018-03-09 DIAGNOSIS — E1159 Type 2 diabetes mellitus with other circulatory complications: Secondary | ICD-10-CM | POA: Diagnosis not present

## 2018-03-09 DIAGNOSIS — R634 Abnormal weight loss: Secondary | ICD-10-CM | POA: Diagnosis not present

## 2018-03-09 DIAGNOSIS — I739 Peripheral vascular disease, unspecified: Secondary | ICD-10-CM | POA: Diagnosis not present

## 2018-03-09 NOTE — Progress Notes (Signed)
Location:  Occupational psychologist of Service:  SNF (31) Provider:   Cindi Carbon, ANP [place] [phone]   Gayland Curry, DO  Patient Care Team: Gayland Curry, DO as PCP - General (Geriatric Medicine) Community, Well Orpah Greek, MD as Consulting Physician (Gastroenterology) Lorretta Harp, MD as Consulting Physician (Cardiology) Garvin Fila, MD as Consulting Physician (Neurology) Bjorn Loser, MD as Consulting Physician (Urology) Pedro Earls, MD as Attending Physician [facility] Medicine)  Extended Emergency Contact Information Primary Emergency Contact: Allena Earing [place] of [place] Phone: [phone] Relation: Friend Secondary Emergency Contact: [facility]  [place] Phone: [phone] Relation: Other  Code Status:  DNR Goals of care: Advanced Directive information Advanced Directives 02/14/2018  Does Patient Have a Medical Advance Directive? Yes  Type of Advance Directive Out of facility DNR (pink MOST or yellow form);[place];Living will  Does patient want to make changes to medical advance directive? No - Patient declined  Copy of [place] in Chart? Yes  Pre-existing out of facility DNR order (yellow form or pink MOST form) Yellow form placed in chart (order not valid for inpatient use);Pink MOST form placed in chart (order not valid for inpatient use)     Chief Complaint  Patient presents with  . Medical Management of Chronic Issues    HPI:  Pt is a 84 y.o. female seen today for medical management of chronic diseases. Resides in skilled care due to functional and cognitive losses related to vascular dementia. She is followed by hospice.   CBGS range 127-137  She has not used prn ativan or roxanol in the past week.   The nurse reports that her behavior has been fairly good with occasional periods of  resistance to care or agitation. She is on scheduled depakote and ativan for this reason.  She has chronic stasis dermatitis and uses clobetasol to her legs. The nurses are asking to discontinue this med as she has not had any redness and has been on the med list long term.   Her weight has declined 4 lbs in the past month. She is currently taking unjury each day and offered snacks.    Past Medical History:  Diagnosis Date  . Abnormality of gait 11/18/2008  . Anxiety   . Basilar artery syndrome   . Closed fracture of intertrochanteric section of femur ([place]) 03/04/2010  . Corns and callosities 02/05/2008  . Depressive disorder   . Diabetes mellitus   . Diarrhea 12/13/2012  . Dysphagia   . Edema 10/25/2007  . Fecal smearing 04/05/2012  . Hyperlipidemia   . Hypertension   . Insomnia   . Macular degeneration (senile) of retina, unspecified 03/17/2011  . Neuropathy   . Osteoporosis, senile   . Other atopic dermatitis and related conditions 12/13/2012  . Pain in joint, ankle and foot 08/07/2008  . Pain in joint, pelvic region and thigh 10/2008  . Pain in limb 11/18/2008  . Paroxysmal atrial tachycardia ([place])   . Peripheral vascular disease, unspecified ([place]) 08/14/2012  . Plantar fascial fibromatosis   . Rash and other nonspecific skin eruption 12/21/2010  . Rickets, active   . Spinal stenosis, unspecified region other than cervical 11/18/2008  . TIA (transient ischemic attack)   . Transient ischemic attack (TIA), and cerebral infarction without residual deficits(V12.54) 10/28/2002  . Type II or unspecified type diabetes mellitus with peripheral circulatory disorders, uncontrolled(250.72) 02/12/2013  . Unspecified  hereditary and idiopathic peripheral neuropathy 03/17/2011  . Unspecified urinary incontinence 05/13/2008  . Venous insufficiency   . Vitamin D deficiency 11/07/2007   Past Surgical History:  Procedure Laterality Date  . BREAST MASS EXCISION    . CHOLECYSTECTOMY  1964  . COLONOSCOPY   12/18/2010   Dr Oletta Lamas  . DILATION AND CURETTAGE OF UTERUS  2003   post menopausal bleeding. Bicornate uterus/doble cervix, benign path  . ESOPHAGOGASTRODUODENOSCOPY (EGD) WITH PROPOFOL N/A 11/07/2015   Procedure: ESOPHAGOGASTRODUODENOSCOPY (EGD) WITH PROPOFOL;  Surgeon: Carol Ada, MD;  Location: [facility];  Service: Endoscopy;  Laterality: N/A;  . ESOPHAGOGASTRODUODENOSCOPY ENDOSCOPY  12/18/2010   Dr. Oletta Lamas slight gastritis  . HIP FRACTURE SURGERY Right 02/2010  . INCISION / DRAINAGE HAND / FINGER  2001   cat bite  . TONSILLECTOMY AND ADENOIDECTOMY     as a child    Allergies  Allergen Reactions  . Codeine     UNKNOWN  . Tramadol     UNKNOWN  . Vesicare [Solifenacin Succinate]     UNKNOWN    Outpatient Encounter Medications as of 03/09/2018  Medication Sig  . clobetasol cream (TEMOVATE) 6.62 % 1 application daily. To bilateral legs from the Knee down every day before compression stockings applied Monday thur Friday  . Emollient (EUCERIN) lotion Apply 1 mL topically as needed for dry skin.  Marland Kitchen loperamide (IMODIUM) 2 MG capsule Take 2 mg by mouth as needed for diarrhea or loose stools.  Marland Kitchen LORazepam (ATIVAN) 0.5 MG tablet Take 0.5 mg by mouth at bedtime. qhs and q 4 hrs prn. May use gel if not able to swallow pill  . morphine (ROXANOL) 20 MG/ML concentrated solution Take 0.25 mLs (5 mg total) by mouth 2 (two) times daily as needed for severe pain. In am and at 1600 (Patient taking differently: Take 5 mg by mouth every 2 (two) hours as needed for severe pain. In am and at 1600)  . nystatin cream (MYCOSTATIN) Apply 1 application topically 2 (two) times daily as needed.   Marland Kitchen omeprazole (PRILOSEC) 40 MG capsule Take 40 mg by mouth daily.  . phenylephrine (,USE FOR PREPARATION-H,) 0.25 % suppository Place 1 suppository rectally as needed for hemorrhoids.  . Protein (UNJURY PO) Take 1 Scoop by mouth daily.  . sertraline (ZOLOFT) 20 MG/ML concentrated solution Take 200 mg by mouth daily.     Marland Kitchen spironolactone (ALDACTONE) 25 MG tablet Take 25 mg by mouth daily.  . Valproate Sodium (DEPAKENE) 250 MG/5ML SOLN solution Take 125 mg by mouth 2 (two) times daily. for agitation/combative behaviors.   No facility-administered encounter medications on file as of 03/09/2018.     Review of Systems  Constitutional: Negative for activity change, appetite change, chills, diaphoresis, fatigue, fever and unexpected weight change.  HENT: Negative for congestion.   Respiratory: Negative for cough, shortness of breath and wheezing.   Cardiovascular: Positive for leg swelling. Negative for chest pain and palpitations.  Gastrointestinal: Negative for abdominal distention, abdominal pain, constipation and diarrhea.  Genitourinary: Negative for difficulty urinating and dysuria.  Musculoskeletal: Positive for gait problem. Negative for arthralgias, back pain, joint swelling and myalgias.  Skin: Positive for rash and wound. Negative for color change and pallor.  Neurological: Negative for dizziness, tremors, seizures, syncope, facial asymmetry, speech difficulty, weakness, light-headedness, numbness and headaches.  Psychiatric/Behavioral: Positive for behavioral problems and confusion. Negative for agitation, sleep disturbance and suicidal ideas.    Immunization History  Administered Date(s) Administered  . Influenza Inj Mdck Quad Pf  08/05/2016  . Influenza Whole 08/01/2013  . Influenza-Unspecified 08/01/2014, 07/31/2015, 08/05/2016, 10/19/2017  . PPD Test 03/09/2012  . Pneumococcal Conjugate-13 03/04/2015  . Pneumococcal Polysaccharide-23 10/18/1998  . Td 11/04/2011   Pertinent  Health Maintenance Due  Topic Date Due  . OPHTHALMOLOGY EXAM  11/04/2018 (Originally 04/23/2017)  . HEMOGLOBIN A1C  03/21/2018  . FOOT EXAM  04/14/2018  . URINE MICROALBUMIN  04/18/2018  . INFLUENZA VACCINE  05/18/2018  . DEXA SCAN  Completed  . PNA vac Low Risk Adult  Completed   Fall Risk  03/10/2016 09/10/2015  09/02/2014 08/29/2013  Falls in the past year? No No No No  Risk for fall due to : - History of fall(s);Impaired balance/gait;Impaired mobility;Mental status change - -   Functional Status Survey:    Vitals:   03/09/18 1132  Weight: 127 lb 9.6 oz (57.9 kg)   Body mass index is 19.98 kg/m.  Wt Readings from Last 3 Encounters:  03/09/18 127 lb 9.6 oz (57.9 kg)  02/14/18 132 lb (59.9 kg)  12/26/17 131 lb 11.2 oz (59.7 kg)   Physical Exam  Constitutional: No distress.  HENT:  Head: Normocephalic and atraumatic.  Very poor dentition with inflamed gums  Cardiovascular: Normal rate.  No murmur heard. irregular  Pulmonary/Chest: Effort normal and breath sounds normal. No respiratory distress.  Abdominal: Soft. Bowel sounds are normal. She exhibits no distension.  Neurological: She is alert.  Oriented to self and place but not time or situation. Able to f/c  Skin: She is not diaphoretic. There is erythema (BLE).  Left great toe with black area to the tip of the toe with mild surrounding erythema  Psychiatric: She has a normal mood and affect.    Labs reviewed: Recent Labs    04/18/17 08/16/17 09/20/17 0300  NA 135* 137 139  K 5.2 4.5 4.1  BUN 22* 12 12  CREATININE 1.3* 0.9 0.8   Recent Labs    04/18/17 09/20/17 0300  AST 15 12*  ALT 11 8  ALKPHOS 90 114   Recent Labs    04/18/17 08/16/17 09/20/17 0300  WBC 12.5 11.3 14.3  HGB 17.8* 16.3* 16.0  HCT 57* 50* 50*  PLT 375 424* 483*   Lab Results  Component Value Date   TSH 3.30 07/19/2017   Lab Results  Component Value Date   HGBA1C 7.5 09/20/2017   Lab Results  Component Value Date   CHOL 173 08/27/2015   HDL 38 08/27/2015   LDLCALC 102 08/27/2015   TRIG 163 (A) 08/27/2015    Significant Diagnostic Results in last 30 days:  No results found.  Assessment/Plan  1. Type 2 diabetes mellitus with other circulatory complication, without long-term current use of insulin (HCC) D/C CBGs due to goals of care  and numbers within range A1C due next month  2. Late onset Alzheimer's disease with behavioral disturbance Progressive decline in cognition and function Overall doing well with behaviors Continue Depakote and Ativan  3. Weight loss Due to progressive dementia Continue unjury q am  4. Venous stasis dermatitis of both lower extremities D/C clobetasol due to long term use/hospic care and monitor for recurrence   5. Peripheral vascular disease, unspecified ([place]) No longer on plavix due to GIB and goals of care Continue to monitor feet and pain  Goals of care are comfort based Wound to right great toe is closed but has black center and will need monitoring for dressing changes/etc but no aggressive testing or monitoring   Family/  staff Communication: staff  Labs/tests ordered:  Depakote level and A1C in one month

## 2018-04-03 ENCOUNTER — Encounter: Payer: Self-pay | Admitting: Adult Health

## 2018-04-03 ENCOUNTER — Non-Acute Institutional Stay (SKILLED_NURSING_FACILITY): Payer: Medicare Other | Admitting: Adult Health

## 2018-04-03 DIAGNOSIS — F02818 Dementia in other diseases classified elsewhere, unspecified severity, with other behavioral disturbance: Secondary | ICD-10-CM

## 2018-04-03 DIAGNOSIS — I872 Venous insufficiency (chronic) (peripheral): Secondary | ICD-10-CM

## 2018-04-03 DIAGNOSIS — F0281 Dementia in other diseases classified elsewhere with behavioral disturbance: Secondary | ICD-10-CM | POA: Diagnosis not present

## 2018-04-03 DIAGNOSIS — R627 Adult failure to thrive: Secondary | ICD-10-CM

## 2018-04-03 DIAGNOSIS — G301 Alzheimer's disease with late onset: Secondary | ICD-10-CM | POA: Diagnosis not present

## 2018-04-03 DIAGNOSIS — I739 Peripheral vascular disease, unspecified: Secondary | ICD-10-CM | POA: Diagnosis not present

## 2018-04-03 DIAGNOSIS — E1159 Type 2 diabetes mellitus with other circulatory complications: Secondary | ICD-10-CM | POA: Diagnosis not present

## 2018-04-03 NOTE — Progress Notes (Signed)
Location:  Occupational psychologist of Service:  SNF (31) Provider:   Cindi Carbon, ANP [place] [phone]   Gayland Curry, DO  Patient Care Team: Gayland Curry, DO as PCP - General (Geriatric Medicine) Community, Well Orpah Greek, MD as Consulting Physician (Gastroenterology) Lorretta Harp, MD as Consulting Physician (Cardiology) Garvin Fila, MD as Consulting Physician (Neurology) Bjorn Loser, MD as Consulting Physician (Urology) Pedro Earls, MD as Attending Physician [facility] Medicine)  Extended Emergency Contact Information Primary Emergency Contact: Allena Earing [place] of [place] Phone: [phone] Relation: Friend Secondary Emergency Contact: [facility]  [place] Phone: [phone] Relation: Other  Code Status:  DNR Goals of care: Advanced Directive information Advanced Directives 02/14/2018  Does Patient Have a Medical Advance Directive? Yes  Type of Advance Directive Out of facility DNR (pink MOST or yellow form);[place];Living will  Does patient want to make changes to medical advance directive? No - Patient declined  Copy of [place] in Chart? Yes  Pre-existing out of facility DNR order (yellow form or pink MOST form) Yellow form placed in chart (order not valid for inpatient use);Pink MOST form placed in chart (order not valid for inpatient use)     Chief Complaint  Patient presents with  . Medical Management of Chronic Issues    HPI:  Pt is a 84 y.o. female seen today for medical management of chronic diseases.   She has advancing dementia with behaviors and weight loss.Followed by hospice for dementia, weight loss, myeloproliferative disorder  Wt Readings from Last 3 Encounters:  04/03/18 123 lb 6.4 oz (56 kg)  03/09/18 127 lb 9.6 oz (57.9 kg)  02/14/18 132 lb (59.9 kg)  The staff report  that she has poor intake at meals and not compliant with supplements.   DM II: not currently on medications Lab Results  Component Value Date   HGBA1C 7.5 09/20/2017   PFX:[id number] wound to left toe No pain to lower extremities  Functional status: incontinent, not ambulatory  Dementia: staff report agitation with personal care, ativan gel seems to help She also takes depakote twice a day and a level is due     Past Medical History:  Diagnosis Date  . Abnormality of gait 11/18/2008  . Anxiety   . Basilar artery syndrome   . Closed fracture of intertrochanteric section of femur ([place]) 03/04/2010  . Corns and callosities 02/05/2008  . Depressive disorder   . Diabetes mellitus   . Diarrhea 12/13/2012  . Dysphagia   . Edema 10/25/2007  . Fecal smearing 04/05/2012  . Hyperlipidemia   . Hypertension   . Insomnia   . Macular degeneration (senile) of retina, unspecified 03/17/2011  . Neuropathy   . Osteoporosis, senile   . Other atopic dermatitis and related conditions 12/13/2012  . Pain in joint, ankle and foot 08/07/2008  . Pain in joint, pelvic region and thigh 10/2008  . Pain in limb 11/18/2008  . Paroxysmal atrial tachycardia ([place])   . Peripheral vascular disease, unspecified ([place]) 08/14/2012  . Plantar fascial fibromatosis   . Rash and other nonspecific skin eruption 12/21/2010  . Rickets, active   . Spinal stenosis, unspecified region other than cervical 11/18/2008  . TIA (transient ischemic attack)   . Transient ischemic attack (TIA), and cerebral infarction without residual deficits(V12.54) 10/28/2002  . Type II or unspecified type diabetes mellitus with peripheral circulatory disorders, uncontrolled(250.72) 02/12/2013  .  Unspecified hereditary and idiopathic peripheral neuropathy 03/17/2011  . Unspecified urinary incontinence 05/13/2008  . Venous insufficiency   . Vitamin D deficiency 11/07/2007   Past Surgical History:  Procedure Laterality Date  . BREAST MASS EXCISION    .  CHOLECYSTECTOMY  1964  . COLONOSCOPY  12/18/2010   Dr Oletta Lamas  . DILATION AND CURETTAGE OF UTERUS  2003   post menopausal bleeding. Bicornate uterus/doble cervix, benign path  . ESOPHAGOGASTRODUODENOSCOPY (EGD) WITH PROPOFOL N/A 11/07/2015   Procedure: ESOPHAGOGASTRODUODENOSCOPY (EGD) WITH PROPOFOL;  Surgeon: Carol Ada, MD;  [facility];  Service: Endoscopy;  Laterality: N/A;  . ESOPHAGOGASTRODUODENOSCOPY ENDOSCOPY  12/18/2010   Dr. Oletta Lamas slight gastritis  . HIP FRACTURE SURGERY Right 02/2010  . INCISION / DRAINAGE HAND / FINGER  2001   cat bite  . TONSILLECTOMY AND ADENOIDECTOMY     as a child    Allergies  Allergen Reactions  . Codeine     UNKNOWN  . Tramadol     UNKNOWN  . Vesicare [Solifenacin Succinate]     UNKNOWN    Outpatient Encounter Medications as of 04/03/2018  Medication Sig  . Emollient (EUCERIN) lotion Apply 1 mL topically as needed for dry skin.  Marland Kitchen loperamide (IMODIUM) 2 MG capsule Take 2 mg by mouth as needed for diarrhea or loose stools.  Marland Kitchen LORazepam (ATIVAN) 0.5 MG tablet Take 0.5 mg by mouth at bedtime. qhs and q 4 hrs prn. May use gel if not able to swallow pill. Apply gel 0.5 mg Tuesday and Friday prior to whirl pool  . morphine (ROXANOL) 20 MG/ML concentrated solution Take 0.25 mLs (5 mg total) by mouth 2 (two) times daily as needed for severe pain. In am and at 1600 (Patient taking differently: Take 5 mg by mouth every 2 (two) hours as needed for severe pain. In am and at 1600)  . nystatin cream (MYCOSTATIN) Apply 1 application topically 2 (two) times daily as needed.   Marland Kitchen omeprazole (PRILOSEC) 40 MG capsule Take 40 mg by mouth daily.  . phenylephrine (,USE FOR PREPARATION-H,) 0.25 % suppository Place 1 suppository rectally as needed for hemorrhoids.  . Protein (UNJURY PO) Take 1 Scoop by mouth daily.  . sertraline (ZOLOFT) 20 MG/ML concentrated solution Take 200 mg by mouth daily.   Marland Kitchen spironolactone (ALDACTONE) 25 MG tablet Take 12.5 mg by mouth  daily.   . Valproate Sodium (DEPAKENE) 250 MG/5ML SOLN solution Take 125 mg by mouth 2 (two) times daily. for agitation/combative behaviors.   No facility-administered encounter medications on file as of 04/03/2018.     Review of Systems  Unable to perform ROS: Dementia    Immunization History  Administered Date(s) Administered  . Influenza Inj Mdck Quad Pf 08/05/2016  . Influenza Whole 08/01/2013  . Influenza-Unspecified 08/01/2014, 07/31/2015, 08/05/2016, 10/19/2017  . PPD Test 03/09/2012  . Pneumococcal Conjugate-13 03/04/2015  . Pneumococcal Polysaccharide-23 10/18/1998  . Td 11/04/2011   Pertinent  Health Maintenance Due  Topic Date Due  . HEMOGLOBIN A1C  03/21/2018  . OPHTHALMOLOGY EXAM  11/04/2018 (Originally 04/23/2017)  . FOOT EXAM  04/14/2018  . URINE MICROALBUMIN  04/18/2018  . INFLUENZA VACCINE  05/18/2018  . DEXA SCAN  Completed  . PNA vac Low Risk Adult  Completed   Fall Risk  03/10/2016 09/10/2015 09/02/2014 08/29/2013  Falls in the past year? No No No No  Risk for fall due to : - History of fall(s);Impaired balance/gait;Impaired mobility;Mental status change - -   Functional Status Survey:  Vitals:   04/03/18 1534  Weight: 123 lb 6.4 oz (56 kg)   Body mass index is 19.33 kg/m.  Wt Readings from Last 3 Encounters:  04/03/18 123 lb 6.4 oz (56 kg)  03/09/18 127 lb 9.6 oz (57.9 kg)  02/14/18 132 lb (59.9 kg)   Physical Exam  Constitutional: No distress.  Thin and frail  HENT:  Head: Normocephalic and atraumatic.  Cardiovascular: Normal rate.  No murmur heard. irregular  Pulmonary/Chest: Effort normal and breath sounds normal.  Abdominal: Soft. Bowel sounds are normal. She exhibits no distension.  Lymphadenopathy:    She has no cervical adenopathy.  Neurological: She is alert.  Oriented to self, pleasant and able to f/c. Not oriented to place or time  Skin: She is not diaphoretic. There is erythema (LLE with mild edema).  Small ulceration to  left tip of toe healing, no erythema or drainage.   Psychiatric: She has a normal mood and affect.    Labs reviewed: Recent Labs    04/18/17 08/16/17 09/20/17 0300  NA 135* 137 139  K 5.2 4.5 4.1  BUN 22* 12 12  CREATININE 1.3* 0.9 0.8   Recent Labs    04/18/17 09/20/17 0300  AST 15 12*  ALT 11 8  ALKPHOS 90 114   Recent Labs    04/18/17 08/16/17 09/20/17 0300  WBC 12.5 11.3 14.3  HGB 17.8* 16.3* 16.0  HCT 57* 50* 50*  PLT 375 424* 483*   Lab Results  Component Value Date   TSH 3.30 07/19/2017   Lab Results  Component Value Date   HGBA1C 7.5 09/20/2017   Lab Results  Component Value Date   CHOL 173 08/27/2015   HDL 38 08/27/2015   LDLCALC 102 08/27/2015   TRIG 163 (A) 08/27/2015    Significant Diagnostic Results in last 30 days:  No results found.  Assessment/Plan   Type 2 diabetes mellitus with circulatory disorder, without long-term current use of insulin (HCC) A1C pending Diet controlled, goal A1C 8 or less  Dementia with behavioral disturbance Progressive decline in cognition and function Behaviors controlled with ativan gel and depakote level Followed by hospice for supportive care  Stasis dermatitis Off clobetasol Some redness noted to the left leg. None to the right. Would not treat aggressively as she is not in pain and her goals of care are comfort based. If worsens would restart clobetasol  Adult failure to thrive Continues to lose weight due to poor intake. Not compliant per staff with supplements. Continue to offer snacks and assistance with feeding.   Peripheral vascular disease, unspecified ([place]) Wound to the left toe is healing. No pain to lower extremities at this time.  Off plavix due to bleeding   Family/ staff Communication: staff/resident  Labs/tests ordered:  A1C and dep level pending

## 2018-04-06 NOTE — Assessment & Plan Note (Signed)
Progressive decline in cognition and function Behaviors controlled with ativan gel and depakote level Followed by hospice for supportive care

## 2018-04-06 NOTE — Assessment & Plan Note (Signed)
Wound to the left toe is healing. No pain to lower extremities at this time.  Off plavix due to bleeding

## 2018-04-06 NOTE — Assessment & Plan Note (Signed)
A1C pending Diet controlled, goal A1C 8 or less

## 2018-04-06 NOTE — Assessment & Plan Note (Signed)
Off clobetasol Some redness noted to the left leg. None to the right. Would not treat aggressively as she is not in pain and her goals of care are comfort based. If worsens would restart clobetasol

## 2018-04-06 NOTE — Assessment & Plan Note (Signed)
Continues to lose weight due to poor intake. Not compliant per staff with supplements. Continue to offer snacks and assistance with feeding.

## 2018-04-13 ENCOUNTER — Non-Acute Institutional Stay (SKILLED_NURSING_FACILITY): Payer: Medicare Other | Admitting: Adult Health

## 2018-04-13 ENCOUNTER — Encounter: Payer: Self-pay | Admitting: Adult Health

## 2018-04-13 DIAGNOSIS — M7989 Other specified soft tissue disorders: Secondary | ICD-10-CM | POA: Diagnosis not present

## 2018-04-13 NOTE — Progress Notes (Signed)
Location:  Occupational psychologist of Service:  SNF (31) Provider:   Cindi Carbon, ANP [place] [phone]   Gayland Curry, DO  Patient Care Team: Gayland Curry, DO as PCP - General (Geriatric Medicine) Community, [facility] Orpah Greek, MD as Consulting Physician (Gastroenterology) Lorretta Harp, MD as Consulting Physician (Cardiology) Garvin Fila, MD as Consulting Physician (Neurology) Bjorn Loser, MD as Consulting Physician (Urology) Pedro Earls, MD as Attending Physician [facility] Medicine)  Extended Emergency Contact Information Primary Emergency Contact: Allena Earing [place] of [place] Phone: [phone] Relation: Friend Secondary Emergency Contact: [facility]  [place] Phone: [phone] Relation: Other  Code Status: DNR Goals of care: Advanced Directive information Advanced Directives 02/14/2018  Does Patient Have a Medical Advance Directive? Yes  Type of Advance Directive Out of facility DNR (pink MOST or yellow form);[place];Living will  Does patient want to make changes to medical advance directive? No - Patient declined  Copy of [place] in Chart? Yes  Pre-existing out of facility DNR order (yellow form or pink MOST form) Yellow form placed in chart (order not valid for inpatient use);Pink MOST form placed in chart (order not valid for inpatient use)     Chief Complaint  Patient presents with  . Acute Visit    left leg more swollen    HPI:  Pt is a 84 y.o. female seen today for an acute visit for left leg swelling. The staff report that the LLE has become more swollen and red over the past few days. No wounds, drainage, fever. The resident has dementia and can not contribute to the history well but denies pain on exam. She is followed by hospice due to progressive dementia with weight loss and general  decline. A most form is in place indicating antibiotics if necessary. She has a hx of PVD and chronic venous stasis dermatitis. ABI right 0.66 left 0.58 Feb of 2018 She denies any sob or chest pain.  Past Medical History:  Diagnosis Date  . Abnormality of gait 11/18/2008  . Anxiety   . Basilar artery syndrome   . Closed fracture of intertrochanteric section of femur ([place]) 03/04/2010  . Corns and callosities 02/05/2008  . Depressive disorder   . Diabetes mellitus   . Diarrhea 12/13/2012  . Dysphagia   . Edema 10/25/2007  . Fecal smearing 04/05/2012  . Hyperlipidemia   . Hypertension   . Insomnia   . Macular degeneration (senile) of retina, unspecified 03/17/2011  . Neuropathy   . Osteoporosis, senile   . Other atopic dermatitis and related conditions 12/13/2012  . Pain in joint, ankle and foot 08/07/2008  . Pain in joint, pelvic region and thigh 10/2008  . Pain in limb 11/18/2008  . Paroxysmal atrial tachycardia ([place])   . Peripheral vascular disease, unspecified ([place]) 08/14/2012  . Plantar fascial fibromatosis   . Rash and other nonspecific skin eruption 12/21/2010  . Rickets, active   . Spinal stenosis, unspecified region other than cervical 11/18/2008  . TIA (transient ischemic attack)   . Transient ischemic attack (TIA), and cerebral infarction without residual deficits(V12.54) 10/28/2002  . Type II or unspecified type diabetes mellitus with peripheral circulatory disorders, uncontrolled(250.72) 02/12/2013  . Unspecified hereditary and idiopathic peripheral neuropathy 03/17/2011  . Unspecified urinary incontinence 05/13/2008  . Venous insufficiency   . Vitamin D deficiency 11/07/2007   Past Surgical History:  Procedure Laterality Date  .  BREAST MASS EXCISION    . CHOLECYSTECTOMY  1964  . COLONOSCOPY  12/18/2010   Dr Oletta Lamas  . DILATION AND CURETTAGE OF UTERUS  2003   post menopausal bleeding. Bicornate uterus/doble cervix, benign path  . ESOPHAGOGASTRODUODENOSCOPY (EGD) WITH PROPOFOL N/A  11/07/2015   Procedure: ESOPHAGOGASTRODUODENOSCOPY (EGD) WITH PROPOFOL;  Surgeon: Carol Ada, MD;  Location: [facility] END[facility];  Service: Endoscopy;  Laterality: N/A;  . ESOPHAGOGASTRODUODENOSCOPY ENDOSCOPY  12/18/2010   Dr. Oletta Lamas slight gastritis  . HIP FRACTURE SURGERY Right 02/2010  . INCISION / DRAINAGE HAND / FINGER  2001   cat bite  . TONSILLECTOMY AND ADENOIDECTOMY     as a child    Allergies  Allergen Reactions  . Codeine     UNKNOWN  . Tramadol     UNKNOWN  . Vesicare [Solifenacin Succinate]     UNKNOWN    Outpatient Encounter Medications as of 04/13/2018  Medication Sig  . Emollient (EUCERIN) lotion Apply 1 mL topically as needed for dry skin.  Marland Kitchen loperamide (IMODIUM) 2 MG capsule Take 2 mg by mouth as needed for diarrhea or loose stools.  Marland Kitchen LORazepam (ATIVAN) 0.5 MG tablet Take 0.5 mg by mouth at bedtime. qhs and q 4 hrs prn. May use gel if not able to swallow pill. Apply gel 0.5 mg Tuesday and Friday prior to whirl pool  . morphine (ROXANOL) 20 MG/ML concentrated solution Take 0.25 mLs (5 mg total) by mouth 2 (two) times daily as needed for severe pain. In am and at 1600 (Patient taking differently: Take 5 mg by mouth every 2 (two) hours as needed for severe pain. In am and at 1600)  . nystatin cream (MYCOSTATIN) Apply 1 application topically 2 (two) times daily as needed.   Marland Kitchen omeprazole (PRILOSEC) 40 MG capsule Take 40 mg by mouth daily.  . phenylephrine (,USE FOR PREPARATION-H,) 0.25 % suppository Place 1 suppository rectally as needed for hemorrhoids.  . Protein (UNJURY PO) Take 1 Scoop by mouth daily.  . sertraline (ZOLOFT) 20 MG/ML concentrated solution Take 200 mg by mouth daily.   Marland Kitchen spironolactone (ALDACTONE) 25 MG tablet Take 12.5 mg by mouth daily.   . Valproate Sodium (DEPAKENE) 250 MG/5ML SOLN solution Take 125 mg by mouth 2 (two) times daily. for agitation/combative behaviors.   No facility-administered encounter medications on file as of 04/13/2018.     Review  of Systems  Unable to perform ROS: Dementia    Immunization History  Administered Date(s) Administered  . Influenza Inj Mdck Quad Pf 08/05/2016  . Influenza Whole 08/01/2013  . Influenza-Unspecified 08/01/2014, 07/31/2015, 08/05/2016, 10/19/2017  . PPD Test 03/09/2012  . Pneumococcal Conjugate-13 03/04/2015  . Pneumococcal Polysaccharide-23 10/18/1998  . Td 11/04/2011   Pertinent  Health Maintenance Due  Topic Date Due  . HEMOGLOBIN A1C  03/21/2018  . OPHTHALMOLOGY EXAM  11/04/2018 (Originally 04/23/2017)  . FOOT EXAM  04/14/2018  . URINE MICROALBUMIN  04/18/2018  . INFLUENZA VACCINE  05/18/2018  . DEXA SCAN  Completed  . PNA vac Low Risk Adult  Completed   Fall Risk  03/10/2016 09/10/2015 09/02/2014 08/29/2013  Falls in the past year? No No No No  Risk for fall due to : - History of fall(s);Impaired balance/gait;Impaired mobility;Mental status change - -   Functional Status Survey:    Vitals:   04/13/18 1454  BP: 106/67  Pulse: 99  Resp: 17  Temp: 98.3 F (36.8 C)  SpO2: 90%   There is no height or weight on file to  calculate BMI. Physical Exam  Constitutional: No distress.  HENT:  Head: Normocephalic and atraumatic.  Neck: No JVD present.  Cardiovascular: Normal rate and regular rhythm.  No murmur heard. Pulses:      Femoral pulses are 1+ on the right side, and 1+ on the left side.      Dorsalis pedis pulses are 0 on the right side, and 0 on the left side.       Posterior tibial pulses are 0 on the right side, and 0 on the left side.  Pulmonary/Chest: Effort normal and breath sounds normal. No respiratory distress. She has no wheezes.  Abdominal: Soft. Bowel sounds are normal.  Neurological: She is alert.  Oriented to self only, able to f/c.  She is sometimes oriented to place as well but not time or situation.   Skin: Skin is warm and dry. She is not diaphoretic. There is erythema (LLE with significant non pitting edema from the toes to the mid thigh area. ).    Psychiatric: She has a normal mood and affect.    Labs reviewed: Recent Labs    04/18/17 08/16/17 09/20/17 0300  NA 135* 137 139  K 5.2 4.5 4.1  BUN 22* 12 12  CREATININE 1.3* 0.9 0.8   Recent Labs    04/18/17 09/20/17 0300  AST 15 12*  ALT 11 8  ALKPHOS 90 114   Recent Labs    04/18/17 08/16/17 09/20/17 0300  WBC 12.5 11.3 14.3  HGB 17.8* 16.3* 16.0  HCT 57* 50* 50*  PLT 375 424* 483*   Lab Results  Component Value Date   TSH 3.30 07/19/2017   Lab Results  Component Value Date   HGBA1C 7.5 09/20/2017   Lab Results  Component Value Date   CHOL 173 08/27/2015   HDL 38 08/27/2015   LDLCALC 102 08/27/2015   TRIG 163 (A) 08/27/2015    Significant Diagnostic Results in last 30 days:  No results found.  Assessment/Plan  1. Left leg swelling Associated with redness but no fever or warmth.  Will begin Doxycycline 100 mg BID for 10 days with food to treat for infection Would consider DVT as well given her hx of poor circulation and asymmetric swelling. I ordered a doppler to the LLE to rule out DVT. I left a message for her POA to call [facility] to discuss her case. If she did have a DVT I do not think she would tolerate anticoagulation well due to her hx of GIB, however, if it were positive it may help guide her care (ruling out the need for antibiotics)  2. PVD Known hx of poor circulation but no further work up or interventions have been indicated due to her declining health and goals of care. Continue hospice supportive care. She is no longer on aspirin or plavix due to GIB.   Family/ staff Communication: staff/resident  Labs/tests ordered:  LLE venous doppler

## 2018-04-17 ENCOUNTER — Non-Acute Institutional Stay (SKILLED_NURSING_FACILITY): Payer: Medicare Other | Admitting: Adult Health

## 2018-04-17 ENCOUNTER — Encounter: Payer: Self-pay | Admitting: Adult Health

## 2018-04-17 DIAGNOSIS — D45 Polycythemia vera: Secondary | ICD-10-CM

## 2018-04-17 DIAGNOSIS — I82402 Acute embolism and thrombosis of unspecified deep veins of left lower extremity: Secondary | ICD-10-CM

## 2018-04-17 DIAGNOSIS — I739 Peripheral vascular disease, unspecified: Secondary | ICD-10-CM | POA: Diagnosis not present

## 2018-04-17 DIAGNOSIS — Z7189 Other specified counseling: Secondary | ICD-10-CM | POA: Diagnosis not present

## 2018-04-17 NOTE — Progress Notes (Signed)
Location:  Occupational psychologist of Service:  SNF (31) Provider:   Cindi Carbon, ANP [place] [phone]   Gayland Curry, DO  Patient Care Team: Gayland Curry, DO as PCP - General (Geriatric Medicine) Community, Well Orpah Greek, MD as Consulting Physician (Gastroenterology) Lorretta Harp, MD as Consulting Physician (Cardiology) Garvin Fila, MD as Consulting Physician (Neurology) Bjorn Loser, MD as Consulting Physician (Urology) Pedro Earls, MD as Attending Physician [facility] Medicine)  Extended Emergency Contact Information Primary Emergency Contact: Allena Earing [place] of [place] Phone: [phone] Relation: Friend Secondary Emergency Contact: [facility]  [place] Phone: [phone] Relation: Other  Code Status:  DNR Goals of care: Advanced Directive information Advanced Directives 02/14/2018  Does Patient Have a Medical Advance Directive? Yes  Type of Advance Directive Out of facility DNR (pink MOST or yellow form);[place];Living will  Does patient want to make changes to medical advance directive? No - Patient declined  Copy of [place] in Chart? Yes  Pre-existing out of facility DNR order (yellow form or pink MOST form) Yellow form placed in chart (order not valid for inpatient use);Pink MOST form placed in chart (order not valid for inpatient use)     Chief Complaint  Patient presents with  . Acute Visit    f/u DVT    HPI:  Pt is a 84 y.o. female seen today for an acute visit for follow up regarding positive doppler. She was seen on 6/27 and noted to have left lower extremity swelling. Differential dx included cellulitis vs DVT.  Doppler to the LLE revealed extensive thrombus to the entire left leg (final report not available yet). She was placed on xarelto. She is currently under hospice care due to  dementia, weight loss, and functional decline. There is also a hx of PVD (ABI right 0.66 left 0.58 Feb of 2018). She reported some LLE pain this morning and was given roxanol with relief. She did not want to get out of bed or eat breakfast. She has a hx of thrombocytosis for which at one time she was on aspirin/plavix. This was stopped due to hospice care. She also has a history of a upper GIB in 2015 in which she was hospitalized. Endoscopies in 2012 (gastritis) and 2017 (benign polyps). Aspirin, Plavix, and Atenolol were all stopped on 4/1 as she is followed by hospice.       Past Medical History:  Diagnosis Date  . Abnormality of gait 11/18/2008  . Anxiety   . Basilar artery syndrome   . Closed fracture of intertrochanteric section of femur ([place]) 03/04/2010  . Corns and callosities 02/05/2008  . Depressive disorder   . Diabetes mellitus   . Diarrhea 12/13/2012  . Dysphagia   . Edema 10/25/2007  . Fecal smearing 04/05/2012  . Hyperlipidemia   . Hypertension   . Insomnia   . Macular degeneration (senile) of retina, unspecified 03/17/2011  . Neuropathy   . Osteoporosis, senile   . Other atopic dermatitis and related conditions 12/13/2012  . Pain in joint, ankle and foot 08/07/2008  . Pain in joint, pelvic region and thigh 10/2008  . Pain in limb 11/18/2008  . Paroxysmal atrial tachycardia ([place])   . Peripheral vascular disease, unspecified ([place]) 08/14/2012  . Plantar fascial fibromatosis   . Rash and other nonspecific skin eruption 12/21/2010  . Rickets, active   . Spinal stenosis, unspecified region other than  cervical 11/18/2008  . TIA (transient ischemic attack)   . Transient ischemic attack (TIA), and cerebral infarction without residual deficits(V12.54) 10/28/2002  . Type II or unspecified type diabetes mellitus with peripheral circulatory disorders, uncontrolled(250.72) 02/12/2013  . Unspecified hereditary and idiopathic peripheral neuropathy 03/17/2011  . Unspecified urinary incontinence  05/13/2008  . Venous insufficiency   . Vitamin D deficiency 11/07/2007   Past Surgical History:  Procedure Laterality Date  . BREAST MASS EXCISION    . CHOLECYSTECTOMY  1964  . COLONOSCOPY  12/18/2010   Dr Oletta Lamas  . DILATION AND CURETTAGE OF UTERUS  2003   post menopausal bleeding. Bicornate uterus/doble cervix, benign path  . ESOPHAGOGASTRODUODENOSCOPY (EGD) WITH PROPOFOL N/A 11/07/2015   Procedure: ESOPHAGOGASTRODUODENOSCOPY (EGD) WITH PROPOFOL;  Surgeon: Carol Ada, MD;  [facility];  Service: Endoscopy;  Laterality: N/A;  . ESOPHAGOGASTRODUODENOSCOPY ENDOSCOPY  12/18/2010   Dr. Oletta Lamas slight gastritis  . HIP FRACTURE SURGERY Right 02/2010  . INCISION / DRAINAGE HAND / FINGER  2001   cat bite  . TONSILLECTOMY AND ADENOIDECTOMY     as a child    Allergies  Allergen Reactions  . Codeine     UNKNOWN  . Tramadol     UNKNOWN  . Vesicare [Solifenacin Succinate]     UNKNOWN    Outpatient Encounter Medications as of 04/17/2018  Medication Sig  . Rivaroxaban (XARELTO) 15 MG TABS tablet Take 15 mg by mouth 2 (two) times daily with a meal. 15 mg BID x 21 days, then 20 mg qd no stop date  . Emollient (EUCERIN) lotion Apply 1 mL topically as needed for dry skin.  Marland Kitchen loperamide (IMODIUM) 2 MG capsule Take 2 mg by mouth as needed for diarrhea or loose stools.  Marland Kitchen LORazepam (ATIVAN) 0.5 MG tablet Take 0.5 mg by mouth at bedtime. qhs and q 4 hrs prn. May use gel if not able to swallow pill. Apply gel 0.5 mg Tuesday and Friday prior to whirl pool  . morphine (ROXANOL) 20 MG/ML concentrated solution Take 0.25 mLs (5 mg total) by mouth 2 (two) times daily as needed for severe pain. In am and at 1600 (Patient taking differently: Take 5 mg by mouth every 2 (two) hours as needed for severe pain. In am and at 1600)  . nystatin cream (MYCOSTATIN) Apply 1 application topically 2 (two) times daily as needed.   Marland Kitchen omeprazole (PRILOSEC) 40 MG capsule Take 40 mg by mouth daily.  . phenylephrine  (,USE FOR PREPARATION-H,) 0.25 % suppository Place 1 suppository rectally as needed for hemorrhoids.  . Protein (UNJURY PO) Take 1 Scoop by mouth daily.  . sertraline (ZOLOFT) 20 MG/ML concentrated solution Take 200 mg by mouth daily.   Marland Kitchen spironolactone (ALDACTONE) 25 MG tablet Take 12.5 mg by mouth daily.   . Valproate Sodium (DEPAKENE) 250 MG/5ML SOLN solution Take 125 mg by mouth 2 (two) times daily. for agitation/combative behaviors.   No facility-administered encounter medications on file as of 04/17/2018.     Review of Systems  Unable to perform ROS: Dementia    Immunization History  Administered Date(s) Administered  . Influenza Inj Mdck Quad Pf 08/05/2016  . Influenza Whole 08/01/2013  . Influenza-Unspecified 08/01/2014, 07/31/2015, 08/05/2016, 10/19/2017  . PPD Test 03/09/2012  . Pneumococcal Conjugate-13 03/04/2015  . Pneumococcal Polysaccharide-23 10/18/1998  . Td 11/04/2011   Pertinent  Health Maintenance Due  Topic Date Due  . HEMOGLOBIN A1C  03/21/2018  . FOOT EXAM  04/14/2018  . OPHTHALMOLOGY EXAM  11/04/2018 (  Originally 04/23/2017)  . URINE MICROALBUMIN  04/18/2018  . INFLUENZA VACCINE  05/18/2018  . DEXA SCAN  Completed  . PNA vac Low Risk Adult  Completed   Fall Risk  03/10/2016 09/10/2015 09/02/2014 08/29/2013  Falls in the past year? No No No No  Risk for fall due to : - History of fall(s);Impaired balance/gait;Impaired mobility;Mental status change - -   Functional Status Survey:    Vitals:   04/17/18 1224  BP: 123/70  Pulse: 74  Resp: 20  Temp: 97.6 F (36.4 C)  SpO2: 90%   There is no height or weight on file to calculate BMI. Physical Exam  Constitutional: No distress.  HENT:  Head: Normocephalic and atraumatic.  Neck: No JVD present.  Cardiovascular: Normal rate.  No murmur heard. Irregular Unable to palpate pedal pulses to either ext which is chronic.  LLE with 2-3+ edema, erythema noted. No warmth or drainage.   Pulmonary/Chest: Effort  normal and breath sounds normal.  Abdominal: Soft. Bowel sounds are normal. She exhibits no distension. There is no tenderness.  Neurological: She is alert.  Oriented to self only. Able to f/c.  Skin: Skin is warm and dry. She is not diaphoretic. There is erythema (left lower ext).  Psychiatric: She has a normal mood and affect.    Labs reviewed: Recent Labs    04/18/17 08/16/17 09/20/17 0300  NA 135* 137 139  K 5.2 4.5 4.1  BUN 22* 12 12  CREATININE 1.3* 0.9 0.8   Recent Labs    04/18/17 09/20/17 0300  AST 15 12*  ALT 11 8  ALKPHOS 90 114   Recent Labs    04/18/17 08/16/17 09/20/17 0300  WBC 12.5 11.3 14.3  HGB 17.8* 16.3* 16.0  HCT 57* 50* 50*  PLT 375 424* 483*   Lab Results  Component Value Date   TSH 3.30 07/19/2017   Lab Results  Component Value Date   HGBA1C 7.5 09/20/2017   Lab Results  Component Value Date   CHOL 173 08/27/2015   HDL 38 08/27/2015   LDLCALC 102 08/27/2015   TRIG 163 (A) 08/27/2015    Significant Diagnostic Results in last 30 days:  No results found.  Assessment/Plan  1. Acute deep vein thrombosis (DVT) of left lower extremity, unspecified vein (HCC) Continue xarelto 15 mg BID to complete 21 days and then 20 mg qd thereafter Monitor for bleeding and check CBC If she begins to bleed will discontinue as she is already on hospice and her goals of care are comfort oriented, although her POA would like her treated here at the facility for reversible illnesses  2. Polycythemia vera (HCC) Elevated hematocrit and platelet count which may have led to this event  3. PVD (peripheral vascular disease) ([place]) Having more pain in her left leg. She is not a surgical candidate, continue measures to prevent skin breakdown and treat pain  4. Advanced care planning/counseling discussion I spoke with her [place]. I let him know that the clot in her left leg is quite extensive and that she would need to be on long term anticoagulation. Her situation  is further complicated by her hx of PVD.  He is aware that this treatment comes with risk such as bleeding. We agreed to continue the medication as long as she is able to tolerate it and swallow. She is showing signs of further decline with decreased appetite and weakness. He was also made aware of this. She is a DNR and her most  form indicates avoidance of hospitalizations.    Family/ staff Communication: staff/resident POA Tom  Labs/tests ordered:  CBC in 2 weeks

## 2018-04-17 NOTE — ACP (Advance Care Planning) (Signed)
I spoke with her [place]. I let him know that the clot in her left leg is quite extensive and that she would need to be on long term anticoagulation. Her situation is further complicated by her hx of PVD.  He is aware that this treatment comes with risk such as bleeding. We agreed to continue the medication as long as she is able to tolerate it and swallow. She is showing signs of further decline with decreased appetite and weakness. He was also made aware of this. She is a DNR and her most form indicates avoidance of hospitalizations.

## 2018-05-03 LAB — HEMOGLOBIN A1C: Hemoglobin A1C: 6.7

## 2018-05-03 LAB — CBC AND DIFFERENTIAL
HCT: 55 — AB (ref 36–46)
Hemoglobin: 17.1 — AB (ref 12.0–16.0)
Platelets: 456 — AB (ref 150–399)
WBC: 13.8

## 2018-06-05 ENCOUNTER — Non-Acute Institutional Stay (SKILLED_NURSING_FACILITY): Payer: Medicare Other | Admitting: Adult Health

## 2018-06-05 DIAGNOSIS — N183 Chronic kidney disease, stage 3 unspecified: Secondary | ICD-10-CM

## 2018-06-05 DIAGNOSIS — I872 Venous insufficiency (chronic) (peripheral): Secondary | ICD-10-CM

## 2018-06-05 DIAGNOSIS — D45 Polycythemia vera: Secondary | ICD-10-CM

## 2018-06-05 DIAGNOSIS — N3945 Continuous leakage: Secondary | ICD-10-CM

## 2018-06-05 DIAGNOSIS — E1159 Type 2 diabetes mellitus with other circulatory complications: Secondary | ICD-10-CM

## 2018-06-05 DIAGNOSIS — I739 Peripheral vascular disease, unspecified: Secondary | ICD-10-CM | POA: Diagnosis not present

## 2018-06-05 DIAGNOSIS — G301 Alzheimer's disease with late onset: Secondary | ICD-10-CM

## 2018-06-05 DIAGNOSIS — F0281 Dementia in other diseases classified elsewhere with behavioral disturbance: Secondary | ICD-10-CM

## 2018-06-05 DIAGNOSIS — R269 Unspecified abnormalities of gait and mobility: Secondary | ICD-10-CM

## 2018-06-05 DIAGNOSIS — D471 Chronic myeloproliferative disease: Secondary | ICD-10-CM

## 2018-06-05 DIAGNOSIS — I10 Essential (primary) hypertension: Secondary | ICD-10-CM | POA: Diagnosis not present

## 2018-06-05 DIAGNOSIS — R634 Abnormal weight loss: Secondary | ICD-10-CM

## 2018-06-05 DIAGNOSIS — F5101 Primary insomnia: Secondary | ICD-10-CM

## 2018-06-05 DIAGNOSIS — F4321 Adjustment disorder with depressed mood: Secondary | ICD-10-CM

## 2018-06-08 ENCOUNTER — Encounter: Payer: Self-pay | Admitting: Adult Health

## 2018-06-08 NOTE — Assessment & Plan Note (Signed)
Hoyer lift for all transfers due above hx.

## 2018-06-08 NOTE — Assessment & Plan Note (Signed)
Controlled.  

## 2018-06-08 NOTE — Assessment & Plan Note (Signed)
As above, stay hydrated.

## 2018-06-08 NOTE — Assessment & Plan Note (Signed)
Perineal hygiene provided by staff.

## 2018-06-08 NOTE — Progress Notes (Signed)
Provider:   Cindi Carbon, ANP [place] [phone]  Location: [place] of Service:  SNF (31)   PCP: Gayland Curry, DO Patient Care Team: Gayland Curry, DO as PCP - General (Geriatric Medicine) [facility] Orpah Greek, MD as Consulting Physician (Gastroenterology) Lorretta Harp, MD as Consulting Physician (Cardiology) Garvin Fila, MD as Consulting Physician (Neurology) Bjorn Loser, MD as Consulting Physician (Urology) Pedro Earls, MD as Attending Physician [facility] Medicine)  Extended Emergency Contact Information Primary Emergency Contact: Allena E[place] Phone: [phone] Relation: Friend Secondary Emergency Contact: [facility]  [place] Phone: [phone] Relation: Other  Code Status: DNR Goals of Care: Advanced Directive information Advanced Directives 06/08/2018  Does Patient Have a Medical Advance Directive? Yes  Type of Paramedic of [place];Living will;Out of facility DNR (pink MOST or yellow form)  Does patient want to make changes to medical advance directive? No - Patient declined  Copy of [place] in Chart? Yes  Pre-existing out of facility DNR order (yellow form or pink MOST form) Yellow form placed in chart (order not valid for inpatient use);Pink MOST form placed in chart (order not valid for inpatient use)     Chief Complaint  Patient presents with  . Annual Exam    HPI: Patient is a 84 y.o. female seen today for an annual comprehensive examination. She resides in skilled care due to progressive decline associated with dementia. She is followed by hospice due to weight loss, debility, dementia, PVD, GIB, CKD, polycythemia vera, and myeloproliferative disorder.   Lab Results  Component Value Date   HGB 17.1 (A) 05/03/2018   Lab Results  Component Value  Date   WBC 13.8 05/03/2018   She is currently on xarelto for a DVT of the left leg diagnosed 04/13/18.  The swelling and redness have gone down over the past week. She has a hx of GIB and so she will need to be monitored closely.   She has aged out of all screening tests and her goals of care are comfort based with treatment for reversible conditions.   Weight has trended back upward, she is currently on boost TID and unjury powder  Wt Readings from Last 3 Encounters:  06/08/18 130 lb 1.6 oz (59 kg)  04/03/18 123 lb 6.4 oz (56 kg)  03/09/18 127 lb 9.6 oz (57.9 kg)   Vascular dementia: periods of aggression towards staff continue during personal care but other times she is calm when placed at the nsg stations. Staff use ativan on shower days and prn for agitation, as well as scheduled dosing at bedtime.  Dep level 14 04/10/18 02/19/18 MMSE 12/30  PVD: Denies pain to both legs but has a hx of leg pain and numbness (poor historian) ABI right 0.66 left 0.58 Feb of 2018 Right heel with callus from healing previous wound. Left wound to the tip of the left toe is also healing.   DM II : off meds Refused eye exam Lab Results  Component Value Date   HGBA1C 6.7 05/03/2018     Past Medical History:  Diagnosis Date  . Abnormality of gait 11/18/2008  . Anxiety   . Basilar artery syndrome   . Closed fracture of intertrochanteric section of femur ([place]) 03/04/2010  . Corns and callosities 02/05/2008  . Depressive disorder   . Diabetes mellitus   . Diarrhea 12/13/2012  . Dysphagia   .  Edema 10/25/2007  . Fecal smearing 04/05/2012  . Hyperlipidemia   . Hypertension   . Insomnia   . Macular degeneration (senile) of retina, unspecified 03/17/2011  . Neuropathy   . Osteoporosis, senile   . Other atopic dermatitis and related conditions 12/13/2012  . Pain in joint, ankle and foot 08/07/2008  . Pain in joint, pelvic region and thigh 10/2008  . Pain in limb 11/18/2008  . Paroxysmal atrial tachycardia  ([place])   . Peripheral vascular disease, unspecified ([place]) 08/14/2012  . Plantar fascial fibromatosis   . Rash and other nonspecific skin eruption 12/21/2010  . Rickets, active   . Spinal stenosis, unspecified region other than cervical 11/18/2008  . TIA (transient ischemic attack)   . Transient ischemic attack (TIA), and cerebral infarction without residual deficits(V12.54) 10/28/2002  . Type II or unspecified type diabetes mellitus with peripheral circulatory disorders, uncontrolled(250.72) 02/12/2013  . Unspecified hereditary and idiopathic peripheral neuropathy 03/17/2011  . Unspecified urinary incontinence 05/13/2008  . Venous insufficiency   . Vitamin D deficiency 11/07/2007   Past Surgical History:  Procedure Laterality Date  . BREAST MASS EXCISION    . CHOLECYSTECTOMY  1964  . COLONOSCOPY  12/18/2010   Dr Oletta Lamas  . DILATION AND CURETTAGE OF UTERUS  2003   post menopausal bleeding. Bicornate uterus/doble cervix, benign path  . ESOPHAGOGASTRODUODENOSCOPY (EGD) WITH PROPOFOL N/A 11/07/2015   Procedure: ESOPHAGOGASTRODUODENOSCOPY (EGD) WITH PROPOFOL;  Surgeon: Carol Ada, MD;  Location: [facility];  Service: Endoscopy;  Laterality: N/A;  . ESOPHAGOGASTRODUODENOSCOPY ENDOSCOPY  12/18/2010   Dr. Oletta Lamas slight gastritis  . HIP FRACTURE SURGERY Right 02/2010  . INCISION / DRAINAGE HAND / FINGER  2001   cat bite  . TONSILLECTOMY AND ADENOIDECTOMY     as a child    reports that she has never smoked. She has never used smokeless tobacco. She reports that she does not drink alcohol or use drugs. Social History   Socioeconomic History  . Marital status: Widowed    Spouse name: Not on file  . Number of children: Not on file  . Years of education: Not on file  . Highest education level: Not on file  Occupational History  . Not on file  Social Needs  . Financial resource strain: Not on file  . Food insecurity:    Worry: Not on file    Inability: Not on file  . Transportation needs:     Medical: Not on file    Non-medical: Not on file  Tobacco Use  . Smoking status: Never Smoker  . Smokeless tobacco: Never Used  Substance and Sexual Activity  . Alcohol use: No  . Drug use: No  . Sexual activity: Never  Lifestyle  . Physical activity:    Days per week: Not on file    Minutes per session: Not on file  . Stress: Not on file  Relationships  . Social connections:    Talks on phone: Not on file    Gets together: Not on file    Attends religious service: Not on file    Active member of club or organization: Not on file    Attends meetings of clubs or organizations: Not on file    Relationship status: Not on file  Other Topics Concern  . Not on file  Social History Narrative   Patient is Widowed. No children. 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 Diabetes Brother   . Stroke Mother     Pertinent  Health Maintenance Due  Topic Date Due  . INFLUENZA VACCINE  05/18/2018  . URINE MICROALBUMIN  07/06/2018 (Originally 04/18/2018)  . OPHTHALMOLOGY EXAM  11/04/2018 (Originally 04/23/2017)  . HEMOGLOBIN A1C  11/03/2018  . FOOT EXAM  06/06/2019  . DEXA SCAN  Completed  . PNA vac Low Risk Adult  Completed   Fall Risk  03/10/2016 09/10/2015 09/02/2014 08/29/2013  Falls in the past year? No No No No  Risk for fall due to : - History of fall(s);Impaired balance/gait;Impaired mobility;Mental status change - -   Depression screen [facility] 2/9 03/10/2016 09/10/2015 09/02/2014  Decreased Interest 0 0 1  Down, Depressed, Hopeless 0 3 3  PHQ - 2 Score 0 3 4  Altered sleeping - 3 2  Tired, decreased energy - 1 2  Change in appetite - 0 0  Feeling bad or failure about yourself  - 0 0  Trouble concentrating - 0 1  Moving slowly  or fidgety/restless - 0 0  Suicidal thoughts - 0 0  PHQ-9 Score - 7 -  Difficult doing work/chores - Not difficult at all -    Functional Status Survey:    Allergies  Allergen Reactions  . Codeine     UNKNOWN  . Tramadol     UNKNOWN  . Vesicare [Solifenacin Succinate]     UNKNOWN    Outpatient Encounter Medications as of 06/05/2018  Medication Sig  . Emollient (EUCERIN) lotion Apply 1 mL topically as needed for dry skin.  Marland Kitchen lactose free nutrition (BOOST) LIQD Take 237 mLs by mouth 3 (three) times daily between meals.  Marland Kitchen loperamide (IMODIUM) 2 MG capsule Take 2 mg by mouth as needed for diarrhea or loose stools.  Marland Kitchen LORazepam (ATIVAN) 0.5 MG tablet Take 0.5 mg by mouth at bedtime. qhs and q 4 hrs prn. May use gel if not able to swallow pill. Apply gel 0.5 mg Tuesday and Friday prior to whirl pool  . morphine (ROXANOL) 20 MG/ML concentrated solution Take 0.25 mLs (5 mg total) by mouth 2 (two) times daily as needed for severe pain. In am and at 1600 (Patient taking differently: Take 5 mg by mouth every 2 (two) hours as needed for severe pain. In am and at 1600)  . nystatin cream (MYCOSTATIN) Apply 1 application topically 2 (two) times daily as needed.   Marland Kitchen omeprazole (PRILOSEC) 40 MG capsule Take 40 mg by mouth daily.  . phenylephrine (,USE FOR PREPARATION-H,) 0.25 % suppository Place 1 suppository rectally as needed for hemorrhoids.  . Protein (UNJURY PO) Take 1 Scoop by mouth daily.  . rivaroxaban (XARELTO) 20 MG TABS tablet Take 20 mg by mouth daily with supper.  . sertraline (ZOLOFT) 20 MG/ML concentrated solution Take 200 mg by mouth daily.   Marland Kitchen spironolactone (ALDACTONE) 25 MG tablet Take 12.5 mg by mouth daily.   . Valproate Sodium (DEPAKENE) 250 MG/5ML SOLN solution Take 125 mg by mouth 2 (two) times daily. for agitation/combative behaviors.  . [DISCONTINUED] Rivaroxaban (XARELTO) 15 MG TABS tablet Take 15 mg by mouth 2 (two) times daily with a meal. 15 mg BID x 21 days, then 20 mg  qd no stop date   No facility-administered encounter medications on file as of  06/05/2018.     Review of Systems  Unable to perform ROS: Dementia    Vitals:   06/08/18 0812  Weight: 130 lb 1.6 oz (59 kg)   Body mass index is 20.38 kg/m. Physical Exam  Constitutional: No distress.  HENT:  Head: Normocephalic and atraumatic.  Right Ear: External ear normal.  Left Ear: External ear normal.  Nose: Nose normal.  Mouth/Throat: Oropharynx is clear and moist. No oropharyngeal exudate.  Eyes: Pupils are equal, round, and reactive to light. Conjunctivae and EOM are normal. Right eye exhibits no discharge. Left eye exhibits no discharge.  Neck: No JVD present. No tracheal deviation present. No thyromegaly present.  Cardiovascular: Normal rate and regular rhythm.  Murmur heard. LLE edema +1 with mild erythema. Not able to palpate pedal or tibial pulse. Sensation noted during exam to both feet but difficult to assess due to her dementia.   Pulmonary/Chest: Effort normal and breath sounds normal. No respiratory distress. She has no wheezes. She exhibits no mass and no tenderness. Right breast exhibits no inverted nipple, no mass, no nipple discharge, no skin change and no tenderness. Left breast exhibits no inverted nipple, no mass, no nipple discharge, no skin change and no tenderness.  Abdominal: Soft. Bowel sounds are normal.  Genitourinary:  Genitourinary Comments: deferred  Musculoskeletal: She exhibits no edema or tenderness.  kyphosis  Neurological: She is alert.  Oriented x 2  Skin: Skin is warm and dry. She is not diaphoretic.  Psychiatric: She has a normal mood and affect.    Labs reviewed: Basic Metabolic Panel: Recent Labs    08/16/17 09/20/17 0300  NA 137 139  K 4.5 4.1  BUN 12 12  CREATININE 0.9 0.8   Liver Function Tests: Recent Labs    09/20/17 0300  AST 12*  ALT 8  ALKPHOS 114   No results for input(s): LIPASE, AMYLASE in the last 8760 hours. No results  for input(s): AMMONIA in the last 8760 hours. CBC: Recent Labs    08/16/17 09/20/17 0300 05/03/18  WBC 11.3 14.3 13.8  HGB 16.3* 16.0 17.1*  HCT 50* 50* 55*  PLT 424* 483* 456*   Cardiac Enzymes: No results for input(s): CKTOTAL, CKMB, CKMBINDEX, TROPONINI in the last 8760 hours. BNP: Invalid input(s): POCBNP Lab Results  Component Value Date   HGBA1C 6.7 05/03/2018   Lab Results  Component Value Date   TSH 3.30 07/19/2017   Lab Results  Component Value Date   VITAMINB12 1,641 09/20/2017   No results found for: FOLATE No results found for: IRON, TIBC, FERRITIN  Imaging and Procedures obtained recently: No results found.  Assessment/Plan   Essential hypertension, benign Controlled.   Peripheral vascular disease, unspecified ([place]) Not a candidate for aggressive interventions. Continue foot care. Wounds are healing, followed by hospice May use roxanol for leg pain.   Type 2 diabetes mellitus with circulatory disorder, without long-term current use of insulin (HCC) Controlled. Check A1C q 6 mo  Venous insufficiency With associated hx of dermatitis. Refuses to wear hose.   Dementia with behavioral disturbance Progressive decline in function and cognition. Now a hoyer lift with assistance needed for all Adls. Continue depakote and ativan for behavioral issues.   CKD (chronic kidney disease), stage III Continue to periodically monitor BMP and avoid nephrotoxic agents   Abnormality of gait Hoyer lift for all transfers due above hx.   Adjustment disorder with depressed mood Continue zoloft 200 mg qd. Followed by Dr Casimiro Needle.  Insomnia Continue ativan at  bedtime.   Myeloproliferative disorder ([place]) Noted chronically elevated WBC.  Comfort measures only.   Polycythemia vera ([place]) As above, stay hydrated.  Weight loss Trending upward.  Continue boost tid and unjury powder. Monitor weights.   Unspecified urinary incontinence Perineal hygiene provided by  staff.   Family/ staff Communication: resident and nurse Tammy  Labs/tests ordered:NA

## 2018-06-08 NOTE — Assessment & Plan Note (Signed)
Continue to periodically monitor BMP and avoid nephrotoxic agents

## 2018-06-08 NOTE — Assessment & Plan Note (Signed)
Noted chronically elevated WBC.  Comfort measures only.

## 2018-06-08 NOTE — Assessment & Plan Note (Addendum)
Trending upward.  Continue boost tid and unjury powder. Monitor weights.

## 2018-06-08 NOTE — Assessment & Plan Note (Signed)
Progressive decline in function and cognition. Now a hoyer lift with assistance needed for all Adls. Continue depakote and ativan for behavioral issues.

## 2018-06-08 NOTE — Assessment & Plan Note (Signed)
Continue ativan at bedtime.

## 2018-06-08 NOTE — Assessment & Plan Note (Signed)
Continue zoloft 200 mg qd. Followed by Dr Casimiro Needle.

## 2018-06-08 NOTE — Assessment & Plan Note (Signed)
With associated hx of dermatitis. Refuses to wear hose.

## 2018-06-08 NOTE — Assessment & Plan Note (Signed)
Not a candidate for aggressive interventions. Continue foot care. Wounds are healing, followed by hospice May use roxanol for leg pain.

## 2018-06-08 NOTE — Assessment & Plan Note (Signed)
Controlled. Check A1C q 6 mo

## 2018-07-06 ENCOUNTER — Non-Acute Institutional Stay (SKILLED_NURSING_FACILITY): Payer: Medicare Other | Admitting: Adult Health

## 2018-07-06 DIAGNOSIS — I872 Venous insufficiency (chronic) (peripheral): Secondary | ICD-10-CM | POA: Diagnosis not present

## 2018-07-06 DIAGNOSIS — R634 Abnormal weight loss: Secondary | ICD-10-CM

## 2018-07-06 DIAGNOSIS — I82402 Acute embolism and thrombosis of unspecified deep veins of left lower extremity: Secondary | ICD-10-CM | POA: Diagnosis not present

## 2018-07-06 DIAGNOSIS — F0281 Dementia in other diseases classified elsewhere with behavioral disturbance: Secondary | ICD-10-CM

## 2018-07-06 DIAGNOSIS — E1159 Type 2 diabetes mellitus with other circulatory complications: Secondary | ICD-10-CM | POA: Diagnosis not present

## 2018-07-06 DIAGNOSIS — D471 Chronic myeloproliferative disease: Secondary | ICD-10-CM

## 2018-07-06 DIAGNOSIS — G301 Alzheimer's disease with late onset: Secondary | ICD-10-CM

## 2018-07-06 DIAGNOSIS — F02818 Dementia in other diseases classified elsewhere, unspecified severity, with other behavioral disturbance: Secondary | ICD-10-CM

## 2018-07-06 DIAGNOSIS — M810 Age-related osteoporosis without current pathological fracture: Secondary | ICD-10-CM

## 2018-07-10 ENCOUNTER — Encounter: Payer: Self-pay | Admitting: Adult Health

## 2018-07-10 DIAGNOSIS — I82409 Acute embolism and thrombosis of unspecified deep veins of unspecified lower extremity: Secondary | ICD-10-CM | POA: Insufficient documentation

## 2018-07-10 NOTE — Progress Notes (Signed)
Location:  Occupational psychologist of Service:  SNF (31) Provider:   Cindi Carbon, ANP [place] [phone]   Gayland Curry, DO  Patient Care Team: Gayland Curry, DO as PCP - General (Geriatric Medicine) Community, Well Orpah Greek, MD as Consulting Physician (Gastroenterology) Lorretta Harp, MD as Consulting Physician (Cardiology) Garvin Fila, MD as Consulting Physician (Neurology) Bjorn Loser, MD as Consulting Physician (Urology) Pedro Earls, MD as Attending Physician [facility] Medicine)  Extended Emergency Contact Information Primary Emergency Contact: Allena Earing [place] of [place] Phone: [phone] Relation: Friend Secondary Emergency Contact: [facility]  [place] Phone: [phone] Relation: Other  Code Status:  DNR Goals of care: Advanced Directive information Advanced Directives 06/08/2018  Does Patient Have a Medical Advance Directive? Yes  Type of Paramedic of [place];Living will;Out of facility DNR (pink MOST or yellow form)  Does patient want to make changes to medical advance directive? No - Patient declined  Copy of [place] in Chart? Yes  Pre-existing out of facility DNR order (yellow form or pink MOST form) Yellow form placed in chart (order not valid for inpatient use);Pink MOST form placed in chart (order not valid for inpatient use)     Chief Complaint  Patient presents with  . Medical Management of Chronic Issues    HPI:  Pt is a 84 y.o. female seen today for medical management of chronic diseases.    DVT found in June of 2019 which was extensive in the left lower extremity and she was started on xarelto. The swelling and redness to the area has improved  She has a hx of stasis dermatitis and does not like to wear compression hose. She takes aldactone each day and the staff keep her  legs elevated as she is no longer ambulatory.   Vascular dementia: she continues with periods of agitation which requires prn ativan. She can be rude to her caretakers and resist care. No periods of crying or sadness noted.   Hx of OP last dexa scan in May of 2016 showed a T score of -1.4  DM II:  Lab Results  Component Value Date   HGBA1C 6.7 05/03/2018   Myeloproliferative disorder: chronically elevated plt, wbc, and Hgb with no current treatment due to her hospice status and goals of care.  Lab Results  Component Value Date   PLT 456 (A) 05/03/2018   Lab Results  Component Value Date   HGB 17.1 (A) 05/03/2018   Lab Results  Component Value Date   WBC 13.8 05/03/2018   Weight loss: trending upward Wt Readings from Last 3 Encounters:  07/10/18 129 lb (58.5 kg)  06/08/18 130 lb 1.6 oz (59 kg)  04/03/18 123 lb 6.4 oz (56 kg)    Past Medical History:  Diagnosis Date  . Abnormality of gait 11/18/2008  . Anxiety   . Basilar artery syndrome   . Closed fracture of intertrochanteric section of femur ([place]) 03/04/2010  . Corns and callosities 02/05/2008  . Depressive disorder   . Diabetes mellitus   . Diarrhea 12/13/2012  . Dysphagia   . Edema 10/25/2007  . Fecal smearing 04/05/2012  . Hyperlipidemia   . Hypertension   . Insomnia   . Macular degeneration (senile) of retina, unspecified 03/17/2011  . Neuropathy   . Osteoporosis, senile   . Other atopic dermatitis and related conditions 12/13/2012  . Pain in joint, ankle and  foot 08/07/2008  . Pain in joint, pelvic region and thigh 10/2008  . Pain in limb 11/18/2008  . Paroxysmal atrial tachycardia ([place])   . Peripheral vascular disease, unspecified ([place]) 08/14/2012  . Plantar fascial fibromatosis   . Rash and other nonspecific skin eruption 12/21/2010  . Rickets, active   . Spinal stenosis, unspecified region other than cervical 11/18/2008  . TIA (transient ischemic attack)   . Transient ischemic attack (TIA), and cerebral infarction  without residual deficits(V12.54) 10/28/2002  . Type II or unspecified type diabetes mellitus with peripheral circulatory disorders, uncontrolled(250.72) 02/12/2013  . Unspecified hereditary and idiopathic peripheral neuropathy 03/17/2011  . Unspecified urinary incontinence 05/13/2008  . Venous insufficiency   . Vitamin D deficiency 11/07/2007   Past Surgical History:  Procedure Laterality Date  . BREAST MASS EXCISION    . CHOLECYSTECTOMY  1964  . COLONOSCOPY  12/18/2010   Dr Oletta Lamas  . DILATION AND CURETTAGE OF UTERUS  2003   post menopausal bleeding. Bicornate uterus/doble cervix, benign path  . ESOPHAGOGASTRODUODENOSCOPY (EGD) WITH PROPOFOL N/A 11/07/2015   Procedure: ESOPHAGOGASTRODUODENOSCOPY (EGD) WITH PROPOFOL;  Surgeon: Carol Ada, MD;  Location: [facility] ENDOSCOPY;  Service: Endoscopy;  Laterality: N/A;  . ESOPHAGOGASTRODUODENOSCOPY ENDOSCOPY  12/18/2010   Dr. Oletta Lamas slight gastritis  . HIP FRACTURE SURGERY Right 02/2010  . INCISION / DRAINAGE HAND / FINGER  2001   cat bite  . TONSILLECTOMY AND ADENOIDECTOMY     as a child    Allergies  Allergen Reactions  . Codeine     UNKNOWN  . Tramadol     UNKNOWN  . Vesicare [Solifenacin Succinate]     UNKNOWN    Outpatient Encounter Medications as of 07/06/2018  Medication Sig  . Emollient (EUCERIN) lotion Apply 1 mL topically as needed for dry skin.  Marland Kitchen lactose free nutrition (BOOST) LIQD Take 237 mLs by mouth daily.   Marland Kitchen loperamide (IMODIUM) 2 MG capsule Take 2 mg by mouth as needed for diarrhea or loose stools.  Marland Kitchen LORazepam (ATIVAN) 0.5 MG tablet Take 0.5 mg by mouth at bedtime. qhs and q 4 hrs prn. May use gel if not able to swallow pill. Apply gel 0.5 mg Tuesday and Friday prior to whirl pool  . morphine (ROXANOL) 20 MG/ML concentrated solution Take 0.25 mLs (5 mg total) by mouth 2 (two) times daily as needed for severe pain. In am and at 1600 (Patient taking differently: Take 5 mg by mouth 2 (two) times daily. And BID prn pain)  .  nystatin cream (MYCOSTATIN) Apply 1 application topically 2 (two) times daily as needed.   Marland Kitchen omeprazole (PRILOSEC) 40 MG capsule Take 40 mg by mouth daily.  . phenylephrine (,USE FOR PREPARATION-H,) 0.25 % suppository Place 1 suppository rectally as needed for hemorrhoids.  . polyethylene glycol (MIRALAX / GLYCOLAX) packet Take 17 g by mouth every other day.  . Protein (UNJURY PO) Take 1 Scoop by mouth daily.  . rivaroxaban (XARELTO) 20 MG TABS tablet Take 20 mg by mouth daily with supper.  . sertraline (ZOLOFT) 20 MG/ML concentrated solution Take 200 mg by mouth daily.   Marland Kitchen spironolactone (ALDACTONE) 25 MG tablet Take 12.5 mg by mouth daily.   . Valproate Sodium (DEPAKENE) 250 MG/5ML SOLN solution Take 125 mg by mouth 2 (two) times daily. for agitation/combative behaviors.   No facility-administered encounter medications on file as of 07/06/2018.     Review of Systems  Constitutional: Negative for activity change, appetite change, chills, diaphoresis, fatigue, fever and unexpected weight  change.  HENT: Negative for congestion.   Respiratory: Negative for cough, shortness of breath and wheezing.   Cardiovascular: Positive for leg swelling. Negative for chest pain and palpitations.  Gastrointestinal: Negative for abdominal distention, abdominal pain, constipation and diarrhea.  Genitourinary: Negative for difficulty urinating and dysuria.  Musculoskeletal: Positive for arthralgias and gait problem. Negative for back pain, joint swelling and myalgias.  Skin: Positive for rash. Negative for wound.  Neurological: Positive for weakness. Negative for dizziness, tremors, seizures, syncope, facial asymmetry, speech difficulty, light-headedness, numbness and headaches.  Psychiatric/Behavioral: Positive for agitation, behavioral problems and confusion.    Immunization History  Administered Date(s) Administered  . Influenza Inj Mdck Quad Pf 08/05/2016  . Influenza Whole 08/01/2013  .  Influenza-Unspecified 08/01/2014, 07/31/2015, 08/05/2016, 10/19/2017  . PPD Test 03/09/2012  . Pneumococcal Conjugate-13 03/04/2015  . Pneumococcal Polysaccharide-23 10/18/1998  . Td 11/04/2011   Pertinent  Health Maintenance Due  Topic Date Due  . URINE MICROALBUMIN  04/18/2018  . INFLUENZA VACCINE  05/18/2018  . OPHTHALMOLOGY EXAM  11/04/2018 (Originally 04/23/2017)  . HEMOGLOBIN A1C  11/03/2018  . FOOT EXAM  06/06/2019  . DEXA SCAN  Completed  . PNA vac Low Risk Adult  Completed   Fall Risk  03/10/2016 09/10/2015 09/02/2014 08/29/2013  Falls in the past year? No No No No  Risk for fall due to : - History of fall(s);Impaired balance/gait;Impaired mobility;Mental status change - -   Functional Status Survey:    Vitals:   07/10/18 1549  Weight: 129 lb (58.5 kg)   Body mass index is 20.2 kg/m.  Wt Readings from Last 3 Encounters:  07/10/18 129 lb (58.5 kg)  06/08/18 130 lb 1.6 oz (59 kg)  04/03/18 123 lb 6.4 oz (56 kg)   Physical Exam  Constitutional: No distress.  HENT:  Head: Normocephalic and atraumatic.  Neck: No JVD present.  Cardiovascular: Normal rate and regular rhythm.  No murmur heard. LLE with erythema and 1+ edema. RLE with erythema and trace edema. No warmth or tenderness noted.   Pulmonary/Chest: Effort normal and breath sounds normal. No respiratory distress. She has no wheezes.  Abdominal: Soft. Bowel sounds are normal. She exhibits no distension. There is no tenderness.  Neurological: She is alert.  Oriented x 2, MAE. Pleasant and able to f/c.   Skin: Skin is warm and dry. She is not diaphoretic.  Psychiatric: She has a normal mood and affect.  Nursing note and vitals reviewed.   Labs reviewed: Recent Labs    08/16/17 09/20/17 0300  NA 137 139  K 4.5 4.1  BUN 12 12  CREATININE 0.9 0.8   Recent Labs    09/20/17 0300  AST 12*  ALT 8  ALKPHOS 114   Recent Labs    08/16/17 09/20/17 0300 05/03/18  WBC 11.3 14.3 13.8  HGB 16.3* 16.0 17.1*    HCT 50* 50* 55*  PLT 424* 483* 456*   Lab Results  Component Value Date   TSH 3.30 07/19/2017   Lab Results  Component Value Date   HGBA1C 6.7 05/03/2018   Lab Results  Component Value Date   CHOL 173 08/27/2015   HDL 38 08/27/2015   LDLCALC 102 08/27/2015   TRIG 163 (A) 08/27/2015    Significant Diagnostic Results in last 30 days:  No results found.  Assessment/Plan 1. Acute deep vein thrombosis (DVT) of left lower extremity, unspecified vein (HCC) Improved edema Continue Xarelto 20 mg qd for a period of 6 months and then recheck  doppler due to the extensive nature if the clot  2. Type 2 diabetes mellitus with other circulatory complication, without long-term current use of insulin (HCC) Diet controlled, no aggressive management of this issue due to her debilitated status  3. Venous insufficiency Continue elevation and aldactone 12.5 mg qd  4. Late onset Alzheimer's disease with behavioral disturbance With periods of agitation and confusion Continue Depakote, zoloft, and ativan for her behavioral issues  5. Myeloproliferative disorder ([place]) Continue to monitor, goals of care are comfort based.  6. Weight loss Trending upward, continue unjury 1 scoop daily  7. Senile osteoporosis No longer ambulatory and followed by hospice so no further treatment indicated    Family/ staff Communication: staff/resident  Labs/tests ordered:  NA

## 2018-07-28 ENCOUNTER — Non-Acute Institutional Stay (SKILLED_NURSING_FACILITY): Payer: Medicare Other | Admitting: Adult Health

## 2018-07-28 ENCOUNTER — Encounter: Payer: Self-pay | Admitting: Adult Health

## 2018-07-28 DIAGNOSIS — T148XXA Other injury of unspecified body region, initial encounter: Secondary | ICD-10-CM

## 2018-07-28 NOTE — Progress Notes (Signed)
Location:  Occupational psychologist of Service:  SNF (31) Provider:   Cindi Carbon, ANP [place] [phone]   Gayland Curry, DO  Patient Care Team: Gayland Curry, DO as PCP - General (Geriatric Medicine) Community, Well Orpah Greek, MD as Consulting Physician (Gastroenterology) Lorretta Harp, MD as Consulting Physician (Cardiology) Garvin Fila, MD as Consulting Physician (Neurology) Bjorn Loser, MD as Consulting Physician (Urology) Pedro Earls, MD as Attending Physician [facility] Medicine)  Extended Emergency Contact Information Primary Emergency Contact: Allena Earing [place] of [place] Phone: [phone] Relation: Friend Secondary Emergency Contact: [facility]  [place] Phone: [phone] Relation: Other  Code Status:  DNR Goals of care: Advanced Directive information Advanced Directives 06/08/2018  Does Patient Have a Medical Advance Directive? Yes  Type of Paramedic of [place];Living will;Out of facility DNR (pink MOST or yellow form)  Does patient want to make changes to medical advance directive? No - Patient declined  Copy of [place] in Chart? Yes  Pre-existing out of facility DNR order (yellow form or pink MOST form) Yellow form placed in chart (order not valid for inpatient use);Pink MOST form placed in chart (order not valid for inpatient use)     Chief Complaint  Patient presents with  . Acute Visit    right knee bruised area, "knot"    HPI:  Pt is a 84 y.o. female seen today for an acute visit for a hard "knot" noted to the lateral right knee area with swelling and bruising. The area was first noticed one day ago and is tender per the staff. There are no reports of falls or other injury. She has a hx of left leg DVT and is on xarelto. 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  She is combative at times and kicks her legs and arms during personal care. She is followed by hospice.  Resident refused vitals signs   Past Medical History:  Diagnosis Date  . Abnormality of gait 11/18/2008  . Anxiety   . Basilar artery syndrome   . Closed fracture of intertrochanteric section of femur ([place]) 03/04/2010  . Corns and callosities 02/05/2008  . Depressive disorder   . Diabetes mellitus   . Diarrhea 12/13/2012  . Dysphagia   . Edema 10/25/2007  . Fecal smearing 04/05/2012  . Hyperlipidemia   . Hypertension   . Insomnia   . Macular degeneration (senile) of retina, unspecified 03/17/2011  . Neuropathy   . Osteoporosis, senile   . Other atopic dermatitis and related conditions 12/13/2012  . Pain in joint, ankle and foot 08/07/2008  . Pain in joint, pelvic region and thigh 10/2008  . Pain in limb 11/18/2008  . Paroxysmal atrial tachycardia ([place])   . Peripheral vascular disease, unspecified ([place]) 08/14/2012  . Plantar fascial fibromatosis   . Rash and other nonspecific skin eruption 12/21/2010  . Rickets, active   . Spinal stenosis, unspecified region other than cervical 11/18/2008  . TIA (transient ischemic attack)   . Transient ischemic attack (TIA), and cerebral infarction without residual deficits(V12.54) 10/28/2002  . Type II or unspecified type diabetes mellitus with peripheral circulatory disorders, uncontrolled(250.72) 02/12/2013  . Unspecified hereditary and idiopathic peripheral neuropathy 03/17/2011  . Unspecified urinary incontinence 05/13/2008  . Venous insufficiency   . Vitamin D deficiency 11/07/2007   Past Surgical History:  Procedure Laterality Date  . BREAST MASS EXCISION    .  CHOLECYSTECTOMY  1964  . COLONOSCOPY  12/18/2010   Dr Oletta Lamas  . DILATION AND CURETTAGE OF UTERUS  2003   post menopausal bleeding. Bicornate uterus/doble cervix, benign path  . ESOPHAGOGASTRODUODENOSCOPY (EGD) WITH PROPOFOL N/A 11/07/2015   Procedure: ESOPHAGOGASTRODUODENOSCOPY (EGD) WITH  PROPOFOL;  Surgeon: Carol Ada, MD;  Location[facility];  Service: Endoscopy;  Laterality: N/A;  . ESOPHAGOGASTRODUODENOSCOPY ENDOSCOPY  12/18/2010   Dr. Oletta Lamas slight gastritis  . HIP FRACTURE SURGERY Right 02/2010  . INCISION / DRAINAGE HAND / FINGER  2001   cat bite  . TONSILLECTOMY AND ADENOIDECTOMY     as a child    Allergies  Allergen Reactions  . Codeine     UNKNOWN  . Tramadol     UNKNOWN  . Vesicare [Solifenacin Succinate]     UNKNOWN    Outpatient Encounter Medications as of 07/28/2018  Medication Sig  . Emollient (EUCERIN) lotion Apply 1 mL topically as needed for dry skin.  Marland Kitchen lactose free nutrition (BOOST) LIQD Take 237 mLs by mouth daily.   Marland Kitchen loperamide (IMODIUM) 2 MG capsule Take 2 mg by mouth as needed for diarrhea or loose stools.  Marland Kitchen LORazepam (ATIVAN) 0.5 MG tablet Take 0.5 mg by mouth at bedtime. qhs and q 4 hrs prn. May use gel if not able to swallow pill. Apply gel 0.5 mg Tuesday and Friday prior to whirl pool  . morphine (ROXANOL) 20 MG/ML concentrated solution Take 0.25 mLs (5 mg total) by mouth 2 (two) times daily as needed for severe pain. In am and at 1600 (Patient taking differently: Take 5 mg by mouth 2 (two) times daily. And BID prn pain)  . nystatin cream (MYCOSTATIN) Apply 1 application topically 2 (two) times daily as needed.   Marland Kitchen omeprazole (PRILOSEC) 40 MG capsule Take 40 mg by mouth daily.  . phenylephrine (,USE FOR PREPARATION-H,) 0.25 % suppository Place 1 suppository rectally as needed for hemorrhoids.  . polyethylene glycol (MIRALAX / GLYCOLAX) packet Take 17 g by mouth every other day.  . Protein (UNJURY PO) Take 1 Scoop by mouth daily.  . rivaroxaban (XARELTO) 20 MG TABS tablet Take 20 mg by mouth daily with supper.  . sertraline (ZOLOFT) 20 MG/ML concentrated solution Take 200 mg by mouth daily.   Marland Kitchen spironolactone (ALDACTONE) 25 MG tablet Take 12.5 mg by mouth daily.   . Valproate Sodium (DEPAKENE) 250 MG/5ML SOLN solution Take 125 mg by  mouth 2 (two) times daily. for agitation/combative behaviors.   No facility-administered encounter medications on file as of 07/28/2018.     Review of Systems  Unable to perform ROS: Dementia    Immunization History  Administered Date(s) Administered  . Influenza Inj Mdck Quad Pf 08/05/2016  . Influenza Whole 08/01/2013  . Influenza-Unspecified 08/01/2014, 07/31/2015, 08/05/2016, 10/19/2017  . PPD Test 03/09/2012  . Pneumococcal Conjugate-13 03/04/2015  . Pneumococcal Polysaccharide-23 10/18/1998  . Td 11/04/2011   Pertinent  Health Maintenance Due  Topic Date Due  . URINE MICROALBUMIN  04/18/2018  . INFLUENZA VACCINE  05/18/2018  . OPHTHALMOLOGY EXAM  11/04/2018 (Originally 04/23/2017)  . HEMOGLOBIN A1C  11/03/2018  . FOOT EXAM  06/06/2019  . DEXA SCAN  Completed  . PNA vac Low Risk Adult  Completed   Fall Risk  03/10/2016 09/10/2015 09/02/2014 08/29/2013  Falls in the past year? No No No No  Risk for fall due to : - History of fall(s);Impaired balance/gait;Impaired mobility;Mental status change - -   Functional Status Survey:    There  were no vitals filed for this visit. There is no height or weight on file to calculate BMI. Physical Exam  Constitutional: No distress.  Cardiovascular: Normal rate.  No murmur heard. irregular  Pulmonary/Chest: Effort normal and breath sounds normal.  Musculoskeletal: She exhibits edema (right lateral knee area with bruising and hematoma) and tenderness.  Pain with ROM to the right knee.   Neurological: She is alert.  Oriented to self only  Skin: Skin is warm and dry. She is not diaphoretic. There is erythema (BLE L>R).  Nursing note and vitals reviewed.   Labs reviewed: Recent Labs    08/16/17 09/20/17 0300  NA 137 139  K 4.5 4.1  BUN 12 12  CREATININE 0.9 0.8   Recent Labs    09/20/17 0300  AST 12*  ALT 8  ALKPHOS 114   Recent Labs    08/16/17 09/20/17 0300 05/03/18  WBC 11.3 14.3 13.8  HGB 16.3* 16.0 17.1*  HCT  50* 50* 55*  PLT 424* 483* 456*   Lab Results  Component Value Date   TSH 3.30 07/19/2017   Lab Results  Component Value Date   HGBA1C 6.7 05/03/2018   Lab Results  Component Value Date   CHOL 173 08/27/2015   HDL 38 08/27/2015   LDLCALC 102 08/27/2015   TRIG 163 (A) 08/27/2015    Significant Diagnostic Results in last 30 days:  No results found.  Assessment/Plan  1. Hematoma and contusion Ice to the right knee 15 min TID x 48 Resident is non ambulatory, continue hoyer lift use Xray right knee 2 view  Keep elevated Continue scheduled morphine 5 mg BID which seems to help her chronic leg pain  Family/ staff Communication: staff to communicate with POA  Labs/tests ordered:  Xray right knee

## 2018-08-01 DIAGNOSIS — F339 Major depressive disorder, recurrent, unspecified: Secondary | ICD-10-CM | POA: Diagnosis not present

## 2018-08-07 ENCOUNTER — Encounter: Payer: Self-pay | Admitting: Adult Health

## 2018-08-07 ENCOUNTER — Non-Acute Institutional Stay (SKILLED_NURSING_FACILITY): Payer: Medicare Other | Admitting: Adult Health

## 2018-08-07 DIAGNOSIS — G301 Alzheimer's disease with late onset: Secondary | ICD-10-CM

## 2018-08-07 DIAGNOSIS — S8011XD Contusion of right lower leg, subsequent encounter: Secondary | ICD-10-CM

## 2018-08-07 DIAGNOSIS — F0281 Dementia in other diseases classified elsewhere with behavioral disturbance: Secondary | ICD-10-CM

## 2018-08-07 NOTE — Progress Notes (Signed)
Location:  Occupational psychologist of Service:  SNF (31) Provider:   Cindi Carbon, ANP [place] [phone]   Gayland Curry, DO  Patient Care Team: Gayland Curry, DO as PCP - General (Geriatric Medicine) Community, Well Orpah Greek, MD as Consulting Physician (Gastroenterology) Lorretta Harp, MD as Consulting Physician (Cardiology) Garvin Fila, MD as Consulting Physician (Neurology) Bjorn Loser, MD as Consulting Physician (Urology) Pedro Earls, MD as Attending Physician [facility] Medicine)  Extended Emergency Contact Information Primary Emergency Contact: Allena [place] of [place] Phone: [phone] Relation: Friend Secondary Emergency Contact: [facility]  [place] Phone: [phone] Relation: Other  Code Status:  DNR Goals of care: Advanced Directive information Advanced Directives 06/08/2018  Does Patient Have a Medical Advance Directive? Yes  Type of Paramedic of [place];Living will;Out of facility DNR (pink MOST or yellow form)  Does patient want to make changes to medical advance directive? No - Patient declined  Copy of [place] in Chart? Yes  Pre-existing out of facility DNR order (yellow form or pink MOST form) Yellow form placed in chart (order not valid for inpatient use);Pink MOST form placed in chart (order not valid for inpatient use)     Chief Complaint  Patient presents with  . Acute Visit    right leg bruising and behaviors    HPI:  Pt is a 84 y.o. female seen today for an acute visit for right leg bruising and agitation. She has a hx of advanced AD, as well as myeloproliferative disorder, PVD, Dm II, HTN, GIB, FTT, CKD, and weight loss and is followed by hospice. On 10/11 she was seen for a hematoma to her right knee area. The staff were unclear of how this was obtained. A knee xray  showed no acute fracture. She had pain initially but it seems to have improved. The staff is concerned that the bruising is still present and feels "hard".  There is now yellow discoloration up the thigh and down the lower leg as well. The nurse, Tammy, also reports that she is more agitated in the evening and is not getting along well with others in the dining room. Due to her dementia she can be combative at times during personal care and this has not changed. Tammy feels that she does better with some caregivers rather than others. She also responds well to a soft gentle approach.   Past Medical History:  Diagnosis Date  . Abnormality of gait 11/18/2008  . Anxiety   . Basilar artery syndrome   . Closed fracture of intertrochanteric section of femur ([place]) 03/04/2010  . Corns and callosities 02/05/2008  . Depressive disorder   . Diabetes mellitus   . Diarrhea 12/13/2012  . Dysphagia   . Edema 10/25/2007  . Fecal smearing 04/05/2012  . Hyperlipidemia   . Hypertension   . Insomnia   . Macular degeneration (senile) of retina, unspecified 03/17/2011  . Neuropathy   . Osteoporosis, senile   . Other atopic dermatitis and related conditions 12/13/2012  . Pain in joint, ankle and foot 08/07/2008  . Pain in joint, pelvic region and thigh 10/2008  . Pain in limb 11/18/2008  . Paroxysmal atrial tachycardia ([place])   . Peripheral vascular disease, unspecified ([place]) 08/14/2012  . Plantar fascial fibromatosis   . Rash and other nonspecific skin eruption 12/21/2010  . Rickets, active   . Spinal stenosis, unspecified region other  than cervical 11/18/2008  . TIA (transient ischemic attack)   . Transient ischemic attack (TIA), and cerebral infarction without residual deficits(V12.54) 10/28/2002  . Type II or unspecified type diabetes mellitus with peripheral circulatory disorders, uncontrolled(250.72) 02/12/2013  . Unspecified hereditary and idiopathic peripheral neuropathy 03/17/2011  . Unspecified urinary  incontinence 05/13/2008  . Venous insufficiency   . Vitamin D deficiency 11/07/2007   Past Surgical History:  Procedure Laterality Date  . BREAST MASS EXCISION    . CHOLECYSTECTOMY  1964  . COLONOSCOPY  12/18/2010   Dr Oletta Lamas  . DILATION AND CURETTAGE OF UTERUS  2003   post menopausal bleeding. Bicornate uterus/doble cervix, benign path  . ESOPHAGOGASTRODUODENOSCOPY (EGD) WITH PROPOFOL N/A 11/07/2015   Procedure: ESOPHAGOGASTRODUODENOSCOPY (EGD) WITH PROPOFOL;  Surgeon: Carol Ada, MD;  Location: [facility];  Service: Endoscopy;  Laterality: N/A;  . ESOPHAGOGASTRODUODENOSCOPY ENDOSCOPY  12/18/2010   Dr. Oletta Lamas slight gastritis  . HIP FRACTURE SURGERY Right 02/2010  . INCISION / DRAINAGE HAND / FINGER  2001   cat bite  . TONSILLECTOMY AND ADENOIDECTOMY     as a child    Allergies  Allergen Reactions  . Codeine     UNKNOWN  . Tramadol     UNKNOWN  . Vesicare [Solifenacin Succinate]     UNKNOWN    Outpatient Encounter Medications as of 08/07/2018  Medication Sig  . Emollient (EUCERIN) lotion Apply 1 mL topically as needed for dry skin.  Marland Kitchen lactose free nutrition (BOOST) LIQD Take 237 mLs by mouth daily.   Marland Kitchen loperamide (IMODIUM) 2 MG capsule Take 2 mg by mouth as needed for diarrhea or loose stools.  Marland Kitchen LORazepam (ATIVAN) 0.5 MG tablet Take 0.5 mg by mouth at bedtime. qhs and q 4 hrs prn. May use gel if not able to swallow pill. Apply gel 0.5 mg Tuesday and Friday prior to whirl pool  . morphine (ROXANOL) 20 MG/ML concentrated solution Take 0.25 mLs (5 mg total) by mouth 2 (two) times daily as needed for severe pain. In am and at 1600 (Patient taking differently: Take 5 mg by mouth 2 (two) times daily. And 10 mg BID prn pain)  . nystatin cream (MYCOSTATIN) Apply 1 application topically 2 (two) times daily as needed.   Marland Kitchen omeprazole (PRILOSEC) 40 MG capsule Take 40 mg by mouth daily.  . phenylephrine (,USE FOR PREPARATION-H,) 0.25 % suppository Place 1 suppository rectally as needed  for hemorrhoids.  . polyethylene glycol (MIRALAX / GLYCOLAX) packet Take 17 g by mouth every other day.  . Protein (UNJURY PO) Take 1 Scoop by mouth daily.  . rivaroxaban (XARELTO) 20 MG TABS tablet Take 20 mg by mouth daily with supper.  . sertraline (ZOLOFT) 20 MG/ML concentrated solution Take 200 mg by mouth daily.   Marland Kitchen spironolactone (ALDACTONE) 25 MG tablet Take 12.5 mg by mouth daily.   . Valproate Sodium (DEPAKENE) 250 MG/5ML SOLN solution Take 125 mg by mouth 2 (two) times daily. for agitation/combative behaviors.   No facility-administered encounter medications on file as of 08/07/2018.     Review of Systems  Constitutional: Negative for activity change, appetite change, chills, diaphoresis, fatigue, fever and unexpected weight change.  HENT: Negative for congestion.   Respiratory: Negative for cough, shortness of breath and wheezing.   Cardiovascular: Positive for leg swelling. Negative for chest pain and palpitations.  Gastrointestinal: Negative for abdominal distention, abdominal pain, constipation and diarrhea.  Genitourinary: Negative for difficulty urinating and dysuria.  Musculoskeletal: Positive for arthralgias and gait problem. Negative  for back pain, joint swelling and myalgias.  Skin: Positive for color change.  Neurological: Positive for weakness. Negative for dizziness, tremors, seizures, syncope, facial asymmetry, speech difficulty, light-headedness, numbness and headaches.  Hematological: Bruises/bleeds easily.  Psychiatric/Behavioral: Positive for confusion. Negative for agitation and behavioral problems.    Immunization History  Administered Date(s) Administered  . Influenza Inj Mdck Quad Pf 08/05/2016  . Influenza Whole 08/01/2013  . Influenza-Unspecified 08/01/2014, 07/31/2015, 08/05/2016, 10/19/2017  . PPD Test 03/09/2012  . Pneumococcal Conjugate-13 03/04/2015  . Pneumococcal Polysaccharide-23 10/18/1998  . Td 11/04/2011   Pertinent  Health Maintenance  Due  Topic Date Due  . URINE MICROALBUMIN  04/18/2018  . INFLUENZA VACCINE  05/18/2018  . OPHTHALMOLOGY EXAM  11/04/2018 (Originally 04/23/2017)  . HEMOGLOBIN A1C  11/03/2018  . FOOT EXAM  06/06/2019  . DEXA SCAN  Completed  . PNA vac Low Risk Adult  Completed   Fall Risk  03/10/2016 09/10/2015 09/02/2014 08/29/2013  Falls in the past year? No No No No  Risk for fall due to : - History of fall(s);Impaired balance/gait;Impaired mobility;Mental status change - -   Functional Status Survey:    There were no vitals filed for this visit. There is no height or weight on file to calculate BMI. Physical Exam  Constitutional: No distress.  HENT:  Head: Normocephalic and atraumatic.  Cardiovascular: Normal rate and regular rhythm.  Pulmonary/Chest: Effort normal and breath sounds normal.  Abdominal: Soft. Bowel sounds are normal. She exhibits no distension.  Musculoskeletal: She exhibits no edema, tenderness or deformity.  Neurological: She is alert.  Oriented x 2  Skin: Skin is warm and dry. She is not diaphoretic. There is erythema (to BLE L>R).  Ecchymoses in variation stages to the right knee, calf, and thigh area. No tenderness noted. Reduced swelling to the right knee.   Nursing note and vitals reviewed.   Labs reviewed: Recent Labs    08/16/17 09/20/17 0300  NA 137 139  K 4.5 4.1  BUN 12 12  CREATININE 0.9 0.8   Recent Labs    09/20/17 0300  AST 12*  ALT 8  ALKPHOS 114   Recent Labs    08/16/17 09/20/17 0300 05/03/18  WBC 11.3 14.3 13.8  HGB 16.3* 16.0 17.1*  HCT 50* 50* 55*  PLT 424* 483* 456*   Lab Results  Component Value Date   TSH 3.30 07/19/2017   Lab Results  Component Value Date   HGBA1C 6.7 05/03/2018   Lab Results  Component Value Date   CHOL 173 08/27/2015   HDL 38 08/27/2015   LDLCALC 102 08/27/2015   TRIG 163 (A) 08/27/2015    Significant Diagnostic Results in last 30 days:  No results found.  Assessment/Plan 1. Hematoma of right  lower extremity, subsequent encounter The ecchymosis has improved and is in various stages of healing. The swelling has reduced and tenderness has improved.  However, due to the significant amt of bruising she has to the right lower ext (which is improving) will check CBC to monitor Hgb while on Xarelto for DVT.   2. Late onset Alzheimer's disease with behavioral disturbance ([place]) Continue current dosage of depakote.  Try non pharm measures such as altering approach, changing dining room position, and changing caregivers. If no improvement in the next week will consider depakote increase.     Family/ staff Communication: discussed with staff  Labs/tests ordered:   CBC

## 2018-08-08 LAB — CBC AND DIFFERENTIAL
HCT: 45 (ref 36–46)
Hemoglobin: 14.9 (ref 12.0–16.0)
Platelets: 580 — AB (ref 150–399)
WBC: 13.3

## 2018-08-15 ENCOUNTER — Non-Acute Institutional Stay (SKILLED_NURSING_FACILITY): Payer: Medicare Other | Admitting: Internal Medicine

## 2018-08-15 ENCOUNTER — Encounter: Payer: Self-pay | Admitting: Internal Medicine

## 2018-08-15 DIAGNOSIS — K580 Irritable bowel syndrome with diarrhea: Secondary | ICD-10-CM | POA: Diagnosis not present

## 2018-08-15 DIAGNOSIS — F4321 Adjustment disorder with depressed mood: Secondary | ICD-10-CM | POA: Diagnosis not present

## 2018-08-15 DIAGNOSIS — E1143 Type 2 diabetes mellitus with diabetic autonomic (poly)neuropathy: Secondary | ICD-10-CM | POA: Diagnosis not present

## 2018-08-15 DIAGNOSIS — F0281 Dementia in other diseases classified elsewhere with behavioral disturbance: Secondary | ICD-10-CM

## 2018-08-15 DIAGNOSIS — F02818 Dementia in other diseases classified elsewhere, unspecified severity, with other behavioral disturbance: Secondary | ICD-10-CM

## 2018-08-15 DIAGNOSIS — D471 Chronic myeloproliferative disease: Secondary | ICD-10-CM

## 2018-08-15 DIAGNOSIS — G301 Alzheimer's disease with late onset: Secondary | ICD-10-CM

## 2018-08-15 DIAGNOSIS — I82402 Acute embolism and thrombosis of unspecified deep veins of left lower extremity: Secondary | ICD-10-CM

## 2018-08-15 NOTE — Progress Notes (Signed)
Patient ID: Kara Harris, female   DOB: [birth year], 84 y.o.   MRN: [id number]  Location:  [facility] Room Number: 128 Place of Service:  SNF [id number]) Provider:  Gayland Curry, DO  Patient Care Team: Gayland Curry, DO as PCP - General (Geriatric Medicine) Community, Well Orpah Greek, MD as Consulting Physician (Gastroenterology) Lorretta Harp, MD as Consulting Physician (Cardiology) Garvin Fila, MD as Consulting Physician (Neurology) Bjorn Loser, MD as Consulting Physician (Urology) Pedro Earls, MD as Attending Physician (Family Medicine)  Extended Emergency Contact Information Primary Emergency Contact: [place] of [place] Phone: [phone] Relation: Friend Secondary Emergency Contact: [facility]  [place] Phone: [phone] Relation: Other  Code Status:  DNR, MOST, hospice Goals of care: Advanced Directive information Advanced Directives 08/15/2018  Does Patient Have a Medical Advance Directive? Yes  Type of Paramedic of [place];Living will;Out of facility DNR (pink MOST or yellow form)  Does patient want to make changes to medical advance directive? No - Patient declined  Copy of [place] in Chart? Yes  Would patient like information on creating a medical advance directive? No - Patient declined  Pre-existing out of facility DNR order (yellow form or pink MOST form) Yellow form placed in chart (order not valid for inpatient use);Pink MOST form placed in chart (order not valid for inpatient use)   Chief Complaint  Patient presents with  . Medical Management of Chronic Issues    Routine Visit    HPI:  Pt is a 84 y.o. female with dementia, DMII with neuropathy, psoriasis, stasis dermatitis, B12 deficiency, PVD, IBS, multiple gastric polyps, and myeloproliferative disorder on hospice care with DVT on  xarelto seen today for medical management of chronic diseases.  She continues to have periods of paranoia that her items are being stolen.  She still does not like getting bathed and experiences pain when her legs are moved.  Left lateral lower leg sore and ecchymotic--etiology not clear noted yesterday, tender to touch, but also has neuropathy.  She is now getting just one whirlpool bath per week at her POAs request due to her anxiety.  She takes her meds with chocolate shakes and pudding and eats about 1/4 of her meals recently. She also had a finger infection over the past month.  NP saw her for hematomas she has been developing very easily on her lower extremities with her xarelto and MPD.    Her diabetes has been well-controlled.  She is not on meds for it at this time due to her poor intake.  She does take gabapentin for her neuropathy.    Her dementia is advanced and associated with the paranoia.  She gets agitated and combative at times with baths and care.  She is on depakote solution and ativan at bedtime.  She had been depressed when her dementia began--she's been on zoloft.   Her h/h has been normal recently, but thrombocytosis persists. WBC remains elevated.    Irritable bowel is managed by miralax during constipation and imodium during loose stools.  She has morphine for her leg pain along with her gabapentin. Past Medical History:  Diagnosis Date  . Abnormality of gait 11/18/2008  . Anxiety   . Basilar artery syndrome   . Closed fracture of intertrochanteric section of femur ([place]) 03/04/2010  . Corns and callosities 02/05/2008  . Depressive disorder   . Diabetes mellitus   .  Diarrhea 12/13/2012  . Dysphagia   . Edema 10/25/2007  . Fecal smearing 04/05/2012  . Hyperlipidemia   . Hypertension   . Insomnia   . Macular degeneration (senile) of retina, unspecified 03/17/2011  . Neuropathy   . Osteoporosis, senile   . Other atopic dermatitis and related conditions 12/13/2012  . Pain in  joint, ankle and foot 08/07/2008  . Pain in joint, pelvic region and thigh 10/2008  . Pain in limb 11/18/2008  . Paroxysmal atrial tachycardia ([place])   . Peripheral vascular disease, unspecified ([place]) 08/14/2012  . Plantar fascial fibromatosis   . Rash and other nonspecific skin eruption 12/21/2010  . Rickets, active   . Spinal stenosis, unspecified region other than cervical 11/18/2008  . TIA (transient ischemic attack)   . Transient ischemic attack (TIA), and cerebral infarction without residual deficits(V12.54) 10/28/2002  . Type II or unspecified type diabetes mellitus with peripheral circulatory disorders, uncontrolled(250.72) 02/12/2013  . Unspecified hereditary and idiopathic peripheral neuropathy 03/17/2011  . Unspecified urinary incontinence 05/13/2008  . Venous insufficiency   . Vitamin D deficiency 11/07/2007   Past Surgical History:  Procedure Laterality Date  . BREAST MASS EXCISION    . CHOLECYSTECTOMY  1964  . COLONOSCOPY  12/18/2010   Dr Oletta Lamas  . DILATION AND CURETTAGE OF UTERUS  2003   post menopausal bleeding. Bicornate uterus/doble cervix, benign path  . ESOPHAGOGASTRODUODENOSCOPY (EGD) WITH PROPOFOL N/A 11/07/2015   Procedure: ESOPHAGOGASTRODUODENOSCOPY (EGD) WITH PROPOFOL;  Surgeon: Carol Ada, MD;  [facility];  Service: Endoscopy;  Laterality: N/A;  . ESOPHAGOGASTRODUODENOSCOPY ENDOSCOPY  12/18/2010   Dr. Oletta Lamas slight gastritis  . HIP FRACTURE SURGERY Right 02/2010  . INCISION / DRAINAGE HAND / FINGER  2001   cat bite  . TONSILLECTOMY AND ADENOIDECTOMY     as a child    Allergies  Allergen Reactions  . Codeine     UNKNOWN  . Tramadol     UNKNOWN  . Vesicare [Solifenacin Succinate]     UNKNOWN    Outpatient Encounter Medications as of 08/15/2018  Medication Sig  . Emollient (EUCERIN) lotion Apply 1 mL topically as needed for dry skin.  Marland Kitchen gabapentin (NEURONTIN) 100 MG capsule Take 100 mg by mouth 3 (three) times daily.  Marland Kitchen lactose free nutrition  (BOOST) LIQD Take 237 mLs by mouth daily.   Marland Kitchen loperamide (IMODIUM) 2 MG capsule Take 2 mg by mouth as needed for diarrhea or loose stools.  Marland Kitchen LORazepam (ATIVAN) 2 MG/ML concentrated solution Take 0.5 mg by mouth at bedtime. Also give 0.5mg  on Tuesday and Friday prior to bath  . morphine (ROXANOL) 20 MG/ML concentrated solution Take 0.5 mg by mouth every morning.  Marland Kitchen morphine 20 MG/5ML solution Give 0.52ml twice daily as needed, give 0.10ml prior to evening care  . nystatin cream (MYCOSTATIN) Apply 1 application topically 2 (two) times daily as needed.   Marland Kitchen omeprazole (PRILOSEC) 40 MG capsule Take 40 mg by mouth daily.  . phenylephrine (,USE FOR PREPARATION-H,) 0.25 % suppository Place 1 suppository rectally as needed for hemorrhoids.  . polyethylene glycol (MIRALAX / GLYCOLAX) packet Take 17 g by mouth every other day.  . Protein (UNJURY PO) Take 1 Scoop by mouth daily.  . rivaroxaban (XARELTO) 20 MG TABS tablet Take 20 mg by mouth daily with supper.  . sertraline (ZOLOFT) 20 MG/ML concentrated solution Take 200 mg by mouth daily.   . sodium fluoride (PREVIDENT 5000 PLUS) 1.1 % CREA dental cream Place 1 application onto teeth every evening.  Marland Kitchen  spironolactone (ALDACTONE) 25 MG tablet Take 12.5 mg by mouth daily.   . Valproate Sodium (DEPAKENE) 250 MG/5ML SOLN solution Take 125 mg by mouth 2 (two) times daily. for agitation/combative behaviors.  . [DISCONTINUED] LORazepam (ATIVAN) 0.5 MG tablet Take 0.5 mg by mouth at bedtime. qhs and q 4 hrs prn. May use gel if not able to swallow pill. Apply gel 0.5 mg Tuesday and Friday prior to whirl pool  . [DISCONTINUED] morphine (ROXANOL) 20 MG/ML concentrated solution Take 0.25 mLs (5 mg total) by mouth 2 (two) times daily as needed for severe pain. In am and at 1600 (Patient taking differently: Take 5 mg by mouth 2 (two) times daily. And 10 mg BID prn pain)   No facility-administered encounter medications on file as of 08/15/2018.     Review of Systems    Unable to perform ROS: Dementia (see hpi for ROS per nursing)    Immunization History  Administered Date(s) Administered  . Influenza Inj Mdck Quad Pf 08/05/2016  . Influenza Whole 08/01/2013  . Influenza,inj,Quad PF,6+ Mos 08/08/2018  . Influenza-Unspecified 08/01/2014, 07/31/2015, 08/05/2016, 10/19/2017  . PPD Test 03/09/2012  . Pneumococcal Conjugate-13 03/04/2015  . Pneumococcal Polysaccharide-23 10/18/1998  . Td 11/04/2011  . Zoster Recombinat (Shingrix) 11/16/2017, 01/27/2018   Pertinent  Health Maintenance Due  Topic Date Due  . URINE MICROALBUMIN  04/18/2018  . OPHTHALMOLOGY EXAM  11/04/2018 (Originally 04/23/2017)  . HEMOGLOBIN A1C  11/03/2018  . FOOT EXAM  06/06/2019  . INFLUENZA VACCINE  Completed  . DEXA SCAN  Completed  . PNA vac Low Risk Adult  Completed   Fall Risk  03/10/2016 09/10/2015 09/02/2014 08/29/2013  Falls in the past year? No No No No  Risk for fall due to : - History of fall(s);Impaired balance/gait;Impaired mobility;Mental status change - -   There were no vitals filed for this visit. There is no height or weight on file to calculate BMI. Physical Exam  Constitutional: No distress.  Increasingly frail female in geri-chair by nurses' station  HENT:  Head: Normocephalic and atraumatic.  Eyes:  glasses  Cardiovascular: Normal rate, regular rhythm and normal heart sounds.  Purple-pink discoloration of bilateral lower legs, tender when touched and moved, no significant edema today  Pulmonary/Chest: Effort normal and breath sounds normal. No respiratory distress.  Abdominal: Soft. Bowel sounds are normal.  Musculoskeletal:  sarcopenia of legs  Neurological: She is alert.  Skin: Skin is warm and dry.  Psychiatric:  Still has sarcastic sense of humor    Labs reviewed: Recent Labs    08/16/17 09/20/17 0300  NA 137 139  K 4.5 4.1  BUN 12 12  CREATININE 0.9 0.8   Recent Labs    09/20/17 0300  AST 12*  ALT 8  ALKPHOS 114   Recent Labs     09/20/17 0300 05/03/18 08/08/18 0600  WBC 14.3 13.8 13.3  HGB 16.0 17.1* 14.9  HCT 50* 55* 45  PLT 483* 456* 580*   Lab Results  Component Value Date   TSH 3.30 07/19/2017   Lab Results  Component Value Date   HGBA1C 6.7 05/03/2018   Lab Results  Component Value Date   CHOL 173 08/27/2015   HDL 38 08/27/2015   LDLCALC 102 08/27/2015   TRIG 163 (A) 08/27/2015   Assessment/Plan 1. Myeloproliferative disorder [facility]) -has had DVT related and remains on xarelto  -on hospice care with comfort focus  2. Adjustment disorder with depressed mood -cont zoloft therapy  3. Controlled type 2  diabetes mellitus with diabetic autonomic neuropathy, without long-term current use of insulin (HCC) -not on medications due to her poor po intake and declining health, control is good w/o known hypoglycemiia  4. Irritable bowel syndrome with diarrhea -cont miralax and imodium as needed for symptoms  5. Acute deep vein thrombosis (DVT) of left lower extremity, unspecified vein (HCC) -remains on xarelto therapy, h/h has declined   6. Late onset Alzheimer's disease with behavioral disturbance ([place]) -progressive and advanced, cont depakote for mood stabilization  Family/ staff Communication: discussed with snf nurse  Labs/tests ordered:  Avoid testing due to her goals of care being comfort based unless otherwise discussed  Kyshon Tolliver L. Brette Cast, D.O. [facility] [address], [place] [address] Cell Phone (Mon-Fri 8am-5pm):  [phone] On Call:  [phone] & follow prompts after 5pm & weekends Office Phone:  [phone] Office Fax:  [phone]

## 2018-08-17 ENCOUNTER — Encounter: Payer: Self-pay | Admitting: Adult Health

## 2018-08-17 ENCOUNTER — Non-Acute Institutional Stay (SKILLED_NURSING_FACILITY): Payer: Medicare Other | Admitting: Adult Health

## 2018-08-17 ENCOUNTER — Encounter: Payer: Self-pay | Admitting: Internal Medicine

## 2018-08-17 DIAGNOSIS — S60222A Contusion of left hand, initial encounter: Secondary | ICD-10-CM | POA: Diagnosis not present

## 2018-08-17 DIAGNOSIS — S0083XA Contusion of other part of head, initial encounter: Secondary | ICD-10-CM

## 2018-08-17 DIAGNOSIS — S0181XA Laceration without foreign body of other part of head, initial encounter: Secondary | ICD-10-CM | POA: Diagnosis not present

## 2018-08-17 DIAGNOSIS — W19XXXA Unspecified fall, initial encounter: Secondary | ICD-10-CM

## 2018-08-17 DIAGNOSIS — I82402 Acute embolism and thrombosis of unspecified deep veins of left lower extremity: Secondary | ICD-10-CM

## 2018-08-17 NOTE — Progress Notes (Signed)
Location:  Occupational psychologist of Service:  SNF (31) Provider:   Cindi Carbon, ANP [place] [phone]  Gayland Curry, DO  Patient Care Team: Gayland Curry, DO as PCP - General (Geriatric Medicine) Community, Well Orpah Greek, MD as Consulting Physician (Gastroenterology) Lorretta Harp, MD as Consulting Physician (Cardiology) Garvin Fila, MD as Consulting Physician (Neurology) Bjorn Loser, MD as Consulting Physician (Urology) Pedro Earls, MD as Attending Physician [facility] Medicine)  Extended Emergency Contact Information Primary Emergency Contact: [place] of [place] Phone: [phone] Relation: Friend Secondary Emergency Contact: [facility]  [place] Phone: [phone] Relation: Other  Code Status:  DNR Goals of care: Advanced Directive information Advanced Directives 08/15/2018  Does Patient Have a Medical Advance Directive? Yes  Type of Paramedic of [place];Living will;Out of facility DNR (pink MOST or yellow form)  Does patient want to make changes to medical advance directive? No - Patient declined  Copy of [place] in Chart? Yes  Would patient like information on creating a medical advance directive? No - Patient declined  Pre-existing out of facility DNR order (yellow form or pink MOST form) Yellow form placed in chart (order not valid for inpatient use);Pink MOST form placed in chart (order not valid for inpatient use)     Chief Complaint  Patient presents with  . Acute Visit    fall    HPI:  Pt is a 84 y.o. female seen today for an acute visit for a fall. She was found in her room on the floor. She apparently tried to get up without assistance. She has severe dementia and generalized weakness and is not able to ambulate. She sustained a small laceration to her left forehead area  and a hematoma. There was no LOC or focal deficit. She also has a small bruise to her left shin and to the left hand. She reports pain to her left forehead area and left hand.  Vs are stable. She is followed by hospice for advanced dementia as well as PVD.   Past Medical History:  Diagnosis Date  . Abnormality of gait 11/18/2008  . Anxiety   . Basilar artery syndrome   . Closed fracture of intertrochanteric section of femur ([place]) 03/04/2010  . Corns and callosities 02/05/2008  . Depressive disorder   . Diabetes mellitus   . Diarrhea 12/13/2012  . Dysphagia   . Edema 10/25/2007  . Fecal smearing 04/05/2012  . Hyperlipidemia   . Hypertension   . Insomnia   . Macular degeneration (senile) of retina, unspecified 03/17/2011  . Neuropathy   . Osteoporosis, senile   . Other atopic dermatitis and related conditions 12/13/2012  . Pain in joint, ankle and foot 08/07/2008  . Pain in joint, pelvic region and thigh 10/2008  . Pain in limb 11/18/2008  . Paroxysmal atrial tachycardia ([place])   . Peripheral vascular disease, unspecified ([place]) 08/14/2012  . Plantar fascial fibromatosis   . Rash and other nonspecific skin eruption 12/21/2010  . Rickets, active   . Spinal stenosis, unspecified region other than cervical 11/18/2008  . TIA (transient ischemic attack)   . Transient ischemic attack (TIA), and cerebral infarction without residual deficits(V12.54) 10/28/2002  . Type II or unspecified type diabetes mellitus with peripheral circulatory disorders, uncontrolled(250.72) 02/12/2013  . Unspecified hereditary and idiopathic peripheral neuropathy 03/17/2011  . Unspecified urinary incontinence 05/13/2008  . Venous insufficiency   .  Vitamin D deficiency 11/07/2007   Past Surgical History:  Procedure Laterality Date  . BREAST MASS EXCISION    . CHOLECYSTECTOMY  1964  . COLONOSCOPY  12/18/2010   Dr Oletta Lamas  . DILATION AND CURETTAGE OF UTERUS  2003   post menopausal bleeding. Bicornate uterus/doble cervix, benign  path  . ESOPHAGOGASTRODUODENOSCOPY (EGD) WITH PROPOFOL N/A 11/07/2015   Procedure: ESOPHAGOGASTRODUODENOSCOPY (EGD) WITH PROPOFOL;  Surgeon: Carol Ada, MD;  Location: [facility];  Service: Endoscopy;  Laterality: N/A;  . ESOPHAGOGASTRODUODENOSCOPY ENDOSCOPY  12/18/2010   Dr. Oletta Lamas slight gastritis  . HIP FRACTURE SURGERY Right 02/2010  . INCISION / DRAINAGE HAND / FINGER  2001   cat bite  . TONSILLECTOMY AND ADENOIDECTOMY     as a child    Allergies  Allergen Reactions  . Codeine     UNKNOWN  . Tramadol     UNKNOWN  . Vesicare [Solifenacin Succinate]     UNKNOWN    Outpatient Encounter Medications as of 08/17/2018  Medication Sig  . Emollient (EUCERIN) lotion Apply 1 mL topically as needed for dry skin.  Marland Kitchen gabapentin (NEURONTIN) 100 MG capsule Take 100 mg by mouth 3 (three) times daily.  Marland Kitchen lactose free nutrition (BOOST) LIQD Take 237 mLs by mouth daily.   Marland Kitchen loperamide (IMODIUM) 2 MG capsule Take 2 mg by mouth as needed for diarrhea or loose stools.  Marland Kitchen LORazepam (ATIVAN) 2 MG/ML concentrated solution Take 0.5 mg by mouth at bedtime. Also give 0.5mg  on Tuesday and Friday prior to bath  . morphine (ROXANOL) 20 MG/ML concentrated solution Take 0.5 mg by mouth every morning.  Marland Kitchen morphine 20 MG/5ML solution Give 0.29ml twice daily as needed, give 0.14ml prior to evening care  . nystatin cream (MYCOSTATIN) Apply 1 application topically 2 (two) times daily as needed.   Marland Kitchen omeprazole (PRILOSEC) 40 MG capsule Take 40 mg by mouth daily.  . phenylephrine (,USE FOR PREPARATION-H,) 0.25 % suppository Place 1 suppository rectally as needed for hemorrhoids.  . polyethylene glycol (MIRALAX / GLYCOLAX) packet Take 17 g by mouth every other day.  . Protein (UNJURY PO) Take 1 Scoop by mouth daily.  . rivaroxaban (XARELTO) 20 MG TABS tablet Take 20 mg by mouth daily with supper.  . sertraline (ZOLOFT) 20 MG/ML concentrated solution Take 200 mg by mouth daily.   . sodium fluoride (PREVIDENT 5000  PLUS) 1.1 % CREA dental cream Place 1 application onto teeth every evening.  Marland Kitchen spironolactone (ALDACTONE) 25 MG tablet Take 12.5 mg by mouth daily.   . Valproate Sodium (DEPAKENE) 250 MG/5ML SOLN solution Take 125 mg by mouth 2 (two) times daily. for agitation/combative behaviors.   No facility-administered encounter medications on file as of 08/17/2018.     Review of Systems  Constitutional: Negative for activity change, appetite change, chills, diaphoresis, fatigue, fever and unexpected weight change.  HENT: Negative for congestion.   Eyes: Negative for photophobia and visual disturbance.  Respiratory: Negative for cough, shortness of breath and wheezing.   Cardiovascular: Positive for leg swelling. Negative for chest pain and palpitations.  Gastrointestinal: Negative for abdominal distention, abdominal pain, constipation and diarrhea.  Genitourinary: Negative for difficulty urinating and dysuria.  Musculoskeletal: Positive for arthralgias, gait problem and joint swelling. Negative for back pain and myalgias.  Skin:       Eccymoses  Neurological: Positive for weakness (general). Negative for dizziness, tremors, seizures, syncope, facial asymmetry, speech difficulty, light-headedness, numbness and headaches.  Hematological: Bruises/bleeds easily.  Psychiatric/Behavioral: Positive for agitation, behavioral problems  and confusion.    Immunization History  Administered Date(s) Administered  . Influenza Inj Mdck Quad Pf 08/05/2016  . Influenza Whole 08/01/2013  . Influenza,inj,Quad PF,6+ Mos 08/08/2018  . Influenza-Unspecified 08/01/2014, 07/31/2015, 08/05/2016, 10/19/2017  . PPD Test 03/09/2012  . Pneumococcal Conjugate-13 03/04/2015  . Pneumococcal Polysaccharide-23 10/18/1998  . Td 11/04/2011  . Zoster Recombinat (Shingrix) 11/16/2017, 01/27/2018   Pertinent  Health Maintenance Due  Topic Date Due  . URINE MICROALBUMIN  04/18/2018  . OPHTHALMOLOGY EXAM  11/04/2018 (Originally  04/23/2017)  . HEMOGLOBIN A1C  11/03/2018  . FOOT EXAM  06/06/2019  . INFLUENZA VACCINE  Completed  . DEXA SCAN  Completed  . PNA vac Low Risk Adult  Completed   Fall Risk  03/10/2016 09/10/2015 09/02/2014 08/29/2013  Falls in the past year? No No No No  Risk for fall due to : - History of fall(s);Impaired balance/gait;Impaired mobility;Mental status change - -   Functional Status Survey:    Vitals:   08/17/18 1557  BP: 130/72  Pulse: 89  Resp: 18  Temp: 98.6 F (37 C)  SpO2: 95%   There is no height or weight on file to calculate BMI. Physical Exam  HENT:  Very poor dentition Left forehead with small laceration, no longer bleeding and hematoma.   Eyes: Pupils are equal, round, and reactive to light. Conjunctivae and EOM are normal. Right eye exhibits no discharge. Left eye exhibits no discharge.  Cardiovascular: Normal rate and regular rhythm.  Pulmonary/Chest: Effort normal and breath sounds normal.  Abdominal: Soft. Bowel sounds are normal.  Musculoskeletal: She exhibits tenderness (left forehead. ). She exhibits no edema or deformity.  Left hand with small amt of swelling at the wrist area and bruising. No pain with palpation and rom. NO pain with ROM of either hip, knee, elbow, shoulder, hand, wrist.  Neurological: She is alert. No cranial nerve deficit.  Oriented x 2 at baseline. MAE  Skin: Skin is warm and dry. There is erythema (BLE L>R).  Ecchymosis to the left hand and left shin.   Psychiatric: She has a normal mood and affect.  Nursing note and vitals reviewed.   Labs reviewed: Recent Labs    09/20/17 0300  NA 139  K 4.1  BUN 12  CREATININE 0.8   Recent Labs    09/20/17 0300  AST 12*  ALT 8  ALKPHOS 114   Recent Labs    09/20/17 0300 05/03/18 08/08/18 0600  WBC 14.3 13.8 13.3  HGB 16.0 17.1* 14.9  HCT 50* 55* 45  PLT 483* 456* 580*   Lab Results  Component Value Date   TSH 3.30 07/19/2017   Lab Results  Component Value Date   HGBA1C 6.7  05/03/2018   Lab Results  Component Value Date   CHOL 173 08/27/2015   HDL 38 08/27/2015   LDLCALC 102 08/27/2015   TRIG 163 (A) 08/27/2015    Significant Diagnostic Results in last 30 days:  No results found.  Assessment/Plan  1. Fall, initial encounter Due to dementia and attempt to get out of bed without help She should be kept at the nurses station during the day. Continue fall prec and monitor closely.   2. Traumatic hematoma of forehead, initial encounter She is currently on xarelto for a DVT found in June of 2019.  She has received 4 months of treatment. She is on hospice and her goals of care are comfort based. I called and spoke with her [place]. I let him  know the choice would be to send her to the ER for a CT scan or just discontinue the xarelto and monitor her at the facility. Given her frailty and poor quality of life with dementia and behavioral issues it did not seem to be in her best interest to send her to the ER.  We are going to continue vital signs and neuro checks and discontinue the xarelto. Tom verbalized understanding of the risk of recurrent DVT vs the risk of head bleed. Our goal is just that she be comfortable and avoid any aggressive measures.   3. Laceration of forehead, initial encounter No further bleeding. Cleansed and steri strips with ice applied. May apply ice 15 min TID x 48 h  4. Traumatic ecchymosis of left hand, initial encounter Xray left hand to rule out fracture  5. Acute deep vein thrombosis (DVT) of left lower extremity, unspecified vein (HCC) Discontinue xarelto due to recent head injury. Comfort care measures in place, followed by hospice.    Family/ staff Communication: De Burrs  Labs/tests ordered: left hand xray

## 2018-08-29 ENCOUNTER — Non-Acute Institutional Stay (SKILLED_NURSING_FACILITY): Payer: Medicare Other | Admitting: Internal Medicine

## 2018-08-29 ENCOUNTER — Encounter: Payer: Self-pay | Admitting: Internal Medicine

## 2018-08-29 DIAGNOSIS — I82402 Acute embolism and thrombosis of unspecified deep veins of left lower extremity: Secondary | ICD-10-CM | POA: Diagnosis not present

## 2018-08-29 DIAGNOSIS — I96 Gangrene, not elsewhere classified: Secondary | ICD-10-CM

## 2018-08-29 DIAGNOSIS — D471 Chronic myeloproliferative disease: Secondary | ICD-10-CM | POA: Diagnosis not present

## 2018-08-29 NOTE — Progress Notes (Signed)
Patient ID: Kara Harris, female   DOB: May 27, 1923, 84 y.o.   MRN: [id number]  Location:  [place] Room Number: 128 Place of Service:  SNF [phone]) Provider:  Gayland Curry, DO  Patient Care Team: Gayland Curry, DO as PCP - General (Geriatric Medicine) Community, Well Orpah Greek, MD as Consulting Physician (Gastroenterology) Lorretta Harp, MD as Consulting Physician (Cardiology) Garvin Fila, MD as Consulting Physician (Neurology) Bjorn Loser, MD as Consulting Physician (Urology) Pedro Earls, MD as Attending Physician (Family Medicine)  Extended Emergency Contact Information Primary Emergency Contact: [place] of [place] Phone: [phone] Relation: Friend Secondary Emergency Contact: [facility]  [place] Phone: [phone] Relation: Other  Code Status:  DNR, MOST, hospice Goals of care: Advanced Directive information Advanced Directives 08/29/2018  Does Patient Have a Medical Advance Directive? Yes  Type of Paramedic of [place];Living will;Out of facility DNR (pink MOST or yellow form)  Does patient want to make changes to medical advance directive? No - Patient declined  Copy of [place] in Chart? Yes - validated most recent copy scanned in chart (See row information)  Would patient like information on creating a medical advance directive? No - Patient declined  Pre-existing out of facility DNR order (yellow form or pink MOST form) Yellow form placed in chart (order not valid for inpatient use);Pink MOST form placed in chart (order not valid for inpatient use)     Chief Complaint  Patient presents with  . Acute Visit    black toes on left foot?    HPI:  Pt is a 84 y.o. female with h/o  seen today for an acute visit for black toes on her left foot.  Pt had a traumatic fall and struck the left  temple.  A discussion with her POA was conducted and a decision to stop her xarelto (for DVT in context of myeloproliferative disorder/polycythemia) was reached given her overall prognosis and comfort goals.  This was 10/31.   On 11/4, Kara Harris was noted to have bluish to black discoloration of her left 2nd toe.  This has been progressive since.  She then was noted to have the 3rd toe involved at its tip on 11/9.  Then, 11/11, her 4th toe began to appear blacked also.  When seen today, she notes significant tenderness to the middle three toes sparing the great toe and 5th toe at this point.  She's unable to now move the second toe.  Both legs have some chronic erythema distally.  I noted a red circular area on the distal great toe.    Hospice nurse has been in touch with Kara Harris' POA and he agrees with full comfort measures.  She plans to reach back out to him today to let him know that we will monitor and the toes may fall off when circulation to them ceases entirely.  Past Medical History:  Diagnosis Date  . Abnormality of gait 11/18/2008  . Anxiety   . Basilar artery syndrome   . Closed fracture of intertrochanteric section of femur ([place]) 03/04/2010  . Corns and callosities 02/05/2008  . Depressive disorder   . Diabetes mellitus   . Diarrhea 12/13/2012  . Dysphagia   . Edema 10/25/2007  . Fecal smearing 04/05/2012  . Hyperlipidemia   . Hypertension   . Insomnia   . Macular degeneration (senile) of retina, unspecified 03/17/2011  . Neuropathy   .  Osteoporosis, senile   . Other atopic dermatitis and related conditions 12/13/2012  . Pain in joint, ankle and foot 08/07/2008  . Pain in joint, pelvic region and thigh 10/2008  . Pain in limb 11/18/2008  . Paroxysmal atrial tachycardia ([place])   . Peripheral vascular disease, unspecified ([place]) 08/14/2012  . Plantar fascial fibromatosis   . Rash and other nonspecific skin eruption 12/21/2010  . Rickets, active   . Spinal stenosis, unspecified region  other than cervical 11/18/2008  . TIA (transient ischemic attack)   . Transient ischemic attack (TIA), and cerebral infarction without residual deficits(V12.54) 10/28/2002  . Type II or unspecified type diabetes mellitus with peripheral circulatory disorders, uncontrolled(250.72) 02/12/2013  . Unspecified hereditary and idiopathic peripheral neuropathy 03/17/2011  . Unspecified urinary incontinence 05/13/2008  . Venous insufficiency   . Vitamin D deficiency 11/07/2007   Past Surgical History:  Procedure Laterality Date  . BREAST MASS EXCISION    . CHOLECYSTECTOMY  1964  . COLONOSCOPY  12/18/2010   Dr Oletta Lamas  . DILATION AND CURETTAGE OF UTERUS  2003   post menopausal bleeding. Bicornate uterus/doble cervix, benign path  . ESOPHAGOGASTRODUODENOSCOPY (EGD) WITH PROPOFOL N/A 11/07/2015   Procedure: ESOPHAGOGASTRODUODENOSCOPY (EGD) WITH PROPOFOL;  Surgeon: Carol Ada, MD;  [facility];  Service: Endoscopy;  Laterality: N/A;  . ESOPHAGOGASTRODUODENOSCOPY ENDOSCOPY  12/18/2010   Dr. Oletta Lamas slight gastritis  . HIP FRACTURE SURGERY Right 02/2010  . INCISION / DRAINAGE HAND / FINGER  2001   cat bite  . TONSILLECTOMY AND ADENOIDECTOMY     as a child    Allergies  Allergen Reactions  . Codeine     UNKNOWN  . Tramadol     UNKNOWN  . Vesicare [Solifenacin Succinate]     UNKNOWN    Outpatient Encounter Medications as of 08/29/2018  Medication Sig  . Emollient (EUCERIN) lotion Apply 1 mL topically as needed for dry skin.  Marland Kitchen gabapentin (NEURONTIN) 100 MG capsule Take 100 mg by mouth 3 (three) times daily.  Marland Kitchen lactose free nutrition (BOOST) LIQD Take 237 mLs by mouth daily.   Marland Kitchen loperamide (IMODIUM) 2 MG capsule Take 2 mg by mouth as needed for diarrhea or loose stools.  Marland Kitchen LORazepam (ATIVAN) 2 MG/ML concentrated solution 0.5mg  gel twice daily as needed for agitation,Give 0.5mg  (gel) on Tuesday/Friday prior to bath at 630am,  give 0.5mg  gel immediately following dinner at bedtime  .  morphine (ROXANOL) 20 MG/ML concentrated solution Take 0.5 mg by mouth every morning.  Marland Kitchen morphine 20 MG/5ML solution Give 0.33ml twice daily as needed, give 0.30ml prior to evening care  . nystatin cream (MYCOSTATIN) Apply 1 application topically 2 (two) times daily as needed.   Marland Kitchen omeprazole (PRILOSEC) 40 MG capsule Take 40 mg by mouth daily.  . phenylephrine (,USE FOR PREPARATION-H,) 0.25 % suppository Place 1 suppository rectally as needed for hemorrhoids.  . polyethylene glycol (MIRALAX / GLYCOLAX) packet Take 17 g by mouth every other day.  . Protein (UNJURY PO) Take 1 Scoop by mouth daily.  . sertraline (ZOLOFT) 20 MG/ML concentrated solution Take 200 mg by mouth daily.   Marland Kitchen spironolactone (ALDACTONE) 25 MG tablet Take 12.5 mg by mouth daily.   . Valproate Sodium (DEPAKENE) 250 MG/5ML SOLN solution Take 125 mg by mouth 2 (two) times daily. for agitation/combative behaviors.  . [DISCONTINUED] rivaroxaban (XARELTO) 20 MG TABS tablet Take 20 mg by mouth daily with supper.  . [DISCONTINUED] sodium fluoride (PREVIDENT 5000 PLUS) 1.1 % CREA dental cream Place 1 application  onto teeth every evening.   No facility-administered encounter medications on file as of 08/29/2018.     Review of Systems  Constitutional: Positive for activity change, appetite change and fatigue. Negative for chills and fever.       Not fighting nursing today to get meds which is unusual  HENT: Negative for congestion.   Respiratory: Negative for shortness of breath.   Cardiovascular: Negative for chest pain and leg swelling.  Gastrointestinal: Negative for constipation.  Musculoskeletal: Positive for arthralgias and gait problem.  Skin: Positive for color change. Negative for pallor and rash.       Blackened toes left foot; wound left temple with steri strips and gauze over it  Hematological: Bruises/bleeds easily.  Psychiatric/Behavioral: Positive for behavioral problems, confusion and dysphoric mood. Negative for  sleep disturbance. The patient is not nervous/anxious.     Immunization History  Administered Date(s) Administered  . Influenza Inj Mdck Quad Pf 08/05/2016  . Influenza Whole 08/01/2013  . Influenza,inj,Quad PF,6+ Mos 08/08/2018  . Influenza-Unspecified 08/01/2014, 07/31/2015, 08/05/2016, 10/19/2017  . PPD Test 03/09/2012  . Pneumococcal Conjugate-13 03/04/2015  . Pneumococcal Polysaccharide-23 10/18/1998  . Td 11/04/2011  . Zoster Recombinat (Shingrix) 11/16/2017, 01/27/2018   Pertinent  Health Maintenance Due  Topic Date Due  . URINE MICROALBUMIN  04/18/2018  . OPHTHALMOLOGY EXAM  11/04/2018 (Originally 04/23/2017)  . HEMOGLOBIN A1C  11/03/2018  . FOOT EXAM  06/06/2019  . INFLUENZA VACCINE  Completed  . DEXA SCAN  Completed  . PNA vac Low Risk Adult  Completed   Fall Risk  03/10/2016 09/10/2015 09/02/2014 08/29/2013  Falls in the past year? No No No No  Risk for fall due to : - History of fall(s);Impaired balance/gait;Impaired mobility;Mental status change - -   Functional Status Survey:    Vitals:   08/29/18 1025  BP: 123/81  Pulse: 69  Resp: 17  Temp: 98.2 F (36.8 C)  TempSrc: Oral  SpO2: 99%  Weight: 129 lb (58.5 kg)  Height: 5\' 7"  (1.702 m)   Body mass index is 20.2 kg/m. Physical Exam  Constitutional: No distress.  HENT:  Laceration left temple with steri-strips and dressing intact  Cardiovascular: Normal rate, regular rhythm, normal heart sounds and intact distal pulses.  Pulmonary/Chest: Effort normal and breath sounds normal.  Neurological: She is alert.  Skin:  Blackened toes left foot--left great toe has just small erythematous area distally, second toe black distal to joint and bluish more proximally--unable to move it herself, tender to touch; 3rd toe is bluish black on tip; 4th with very small bluish area distally; wound left temple with steri strips and gauze over it     Labs reviewed: Recent Labs    09/20/17 0300  NA 139  K 4.1  BUN 12    CREATININE 0.8   Recent Labs    09/20/17 0300  AST 12*  ALT 8  ALKPHOS 114   Recent Labs    09/20/17 0300 05/03/18 08/08/18 0600  WBC 14.3 13.8 13.3  HGB 16.0 17.1* 14.9  HCT 50* 55* 45  PLT 483* 456* 580*   Lab Results  Component Value Date   TSH 3.30 07/19/2017   Lab Results  Component Value Date   HGBA1C 6.7 05/03/2018   Lab Results  Component Value Date   CHOL 173 08/27/2015   HDL 38 08/27/2015   LDLCALC 102 08/27/2015   TRIG 163 (A) 08/27/2015    Assessment/Plan 1. Dry gangrene ([place]) -definitely of 2nd toe but now spreading  to 3rd and 4th toes on left -has PAD, DVT in context of hypercoagulable state from myeloproliferative disorder (polycythemia) -will work with hospice to adjust pain medication as needed if pain progresses here  2. Acute deep vein thrombosis (DVT) of left lower extremity, unspecified vein (HCC) -off treatment due to head injury with fall and large bleeding laceration on temple, goals of care are comfort-based  3. Myeloproliferative disorder ([place]) -on hospice care for this, cont comfort care measures   Family/ staff Communication: discussed with hospice nurse, snf nurse   Labs/tests ordered:  None, testing at this point not consistent with goals of care  Maghan Jessee L. Laymon Stockert, D.O. [facility] [address], [place] [address] Cell Phone (Mon-Fri 8am-5pm):  [phone] On Call:  [phone] & follow prompts after 5pm & weekends Office Phone:  [phone] Office Fax:  [phone]

## 2018-09-04 ENCOUNTER — Other Ambulatory Visit: Payer: Self-pay | Admitting: Adult Health

## 2018-09-04 MED ORDER — LORAZEPAM 2 MG/ML PO CONC: 0.5000 mg | Freq: Two times a day (BID) | ORAL | 1 refills | Status: DC | PRN

## 2018-09-07 ENCOUNTER — Encounter: Payer: Self-pay | Admitting: Adult Health

## 2018-09-07 ENCOUNTER — Non-Acute Institutional Stay (SKILLED_NURSING_FACILITY): Payer: Medicare Other | Admitting: Adult Health

## 2018-09-07 DIAGNOSIS — H1033 Unspecified acute conjunctivitis, bilateral: Secondary | ICD-10-CM | POA: Diagnosis not present

## 2018-09-07 NOTE — Progress Notes (Signed)
Location:  Occupational psychologist of Service:  SNF (31) Provider:   Cindi Carbon, ANP [place] [phone]   Gayland Curry, DO  Patient Care Team: Gayland Curry, DO as PCP - General (Geriatric Medicine) Community, Well Orpah Greek, MD as Consulting Physician (Gastroenterology) Lorretta Harp, MD as Consulting Physician (Cardiology) Garvin Fila, MD as Consulting Physician (Neurology) Bjorn Loser, MD as Consulting Physician (Urology) Pedro Earls, MD as Attending Physician [facility] Medicine)  Extended Emergency Contact Information Primary Emergency Contact: [place] of [place] Phone: [phone] Relation: Friend Secondary Emergency Contact: [facility]  [place] Phone: [phone] Relation: Other  Code Status:  DNR Goals of care: Advanced Directive information Advanced Directives 08/29/2018  Does Patient Have a Medical Advance Directive? Yes  Type of Paramedic of [place];Living will;Out of facility DNR (pink MOST or yellow form)  Does patient want to make changes to medical advance directive? No - Patient declined  Copy of [place] in Chart? Yes - validated most recent copy scanned in chart (See row information)  Would patient like information on creating a medical advance directive? No - Patient declined  Pre-existing out of facility DNR order (yellow form or pink MOST form) Yellow form placed in chart (order not valid for inpatient use);Pink MOST form placed in chart (order not valid for inpatient use)     Chief Complaint  Patient presents with  . Acute Visit    eye drainage and redness    HPI:  Pt is a 84 y.o. female seen today for an acute visit for eye drainage and redness. The nurse reports that this morning there was redness and green yellow drainage present for 1 day. Kara Harris has  underlying dementia and is not able to provide a history but can answer questions. She denies any change in vision or pain in her eyes.    Past Medical History:  Diagnosis Date  . Abnormality of gait 11/18/2008  . Anxiety   . Basilar artery syndrome   . Closed fracture of intertrochanteric section of femur ([place]) 03/04/2010  . Corns and callosities 02/05/2008  . Depressive disorder   . Diabetes mellitus   . Diarrhea 12/13/2012  . Dysphagia   . Edema 10/25/2007  . Fecal smearing 04/05/2012  . Hyperlipidemia   . Hypertension   . Insomnia   . Macular degeneration (senile) of retina, unspecified 03/17/2011  . Neuropathy   . Osteoporosis, senile   . Other atopic dermatitis and related conditions 12/13/2012  . Pain in joint, ankle and foot 08/07/2008  . Pain in joint, pelvic region and thigh 10/2008  . Pain in limb 11/18/2008  . Paroxysmal atrial tachycardia ([place])   . Peripheral vascular disease, unspecified ([place]) 08/14/2012  . Plantar fascial fibromatosis   . Rash and other nonspecific skin eruption 12/21/2010  . Rickets, active   . Spinal stenosis, unspecified region other than cervical 11/18/2008  . TIA (transient ischemic attack)   . Transient ischemic attack (TIA), and cerebral infarction without residual deficits(V12.54) 10/28/2002  . Type II or unspecified type diabetes mellitus with peripheral circulatory disorders, uncontrolled(250.72) 02/12/2013  . Unspecified hereditary and idiopathic peripheral neuropathy 03/17/2011  . Unspecified urinary incontinence 05/13/2008  . Venous insufficiency   . Vitamin D deficiency 11/07/2007   Past Surgical History:  Procedure Laterality Date  . BREAST MASS EXCISION    . CHOLECYSTECTOMY  1964  . COLONOSCOPY  12/18/2010   Dr Oletta Lamas  . DILATION AND CURETTAGE OF UTERUS  2003   post menopausal bleeding. Bicornate uterus/doble cervix, benign path  . ESOPHAGOGASTRODUODENOSCOPY (EGD) WITH PROPOFOL N/A 11/07/2015   Procedure: ESOPHAGOGASTRODUODENOSCOPY (EGD) WITH  PROPOFOL;  Surgeon: Carol Ada, MD;  Location: [facility] ENDOSCOPY;  Service: Endoscopy;  Laterality: N/A;  . ESOPHAGOGASTRODUODENOSCOPY ENDOSCOPY  12/18/2010   Dr. Oletta Lamas slight gastritis  . HIP FRACTURE SURGERY Right 02/2010  . INCISION / DRAINAGE HAND / FINGER  2001   cat bite  . TONSILLECTOMY AND ADENOIDECTOMY     as a child    Allergies  Allergen Reactions  . Codeine     UNKNOWN  . Tramadol     UNKNOWN  . Vesicare [Solifenacin Succinate]     UNKNOWN    Outpatient Encounter Medications as of 09/07/2018  Medication Sig  . gabapentin (NEURONTIN) 100 MG capsule Take 100 mg by mouth 3 (three) times daily.  Marland Kitchen lactose free nutrition (BOOST) LIQD Take 237 mLs by mouth daily.   Marland Kitchen loperamide (IMODIUM) 2 MG capsule Take 2 mg by mouth as needed for diarrhea or loose stools.  Marland Kitchen LORazepam (ATIVAN) 2 MG/ML concentrated solution Take 0.3 mLs (0.6 mg total) by mouth 2 (two) times daily as needed for anxiety. 0.5mg  gel twice daily as needed for agitation,Give 0.5mg  (gel) on Tuesday/Friday prior to bath at 630am,  give 0.5mg  gel immediately following dinner at bedtime  . morphine 20 MG/5ML solution Give 0.41ml twice daily as needed, give 0.44ml prior to evening care  . nystatin cream (MYCOSTATIN) Apply 1 application topically 2 (two) times daily as needed.   Marland Kitchen omeprazole (PRILOSEC) 40 MG capsule Take 40 mg by mouth daily.  . phenylephrine (,USE FOR PREPARATION-H,) 0.25 % suppository Place 1 suppository rectally as needed for hemorrhoids.  . polyethylene glycol (MIRALAX / GLYCOLAX) packet Take 17 g by mouth every other day.  . Protein (UNJURY PO) Take 1 Scoop by mouth daily.  . sertraline (ZOLOFT) 20 MG/ML concentrated solution Take 200 mg by mouth daily.   Marland Kitchen spironolactone (ALDACTONE) 25 MG tablet Take 12.5 mg by mouth daily.   . Valproate Sodium (DEPAKENE) 250 MG/5ML SOLN solution Take 125 mg by mouth 2 (two) times daily. for agitation/combative behaviors.  . Emollient (EUCERIN) lotion Apply 1 mL  topically as needed for dry skin.  Marland Kitchen morphine (ROXANOL) 20 MG/ML concentrated solution Take 0.5 mg by mouth every morning.   No facility-administered encounter medications on file as of 09/07/2018.     Review of Systems  Constitutional: Negative for activity change, appetite change, chills, diaphoresis, fatigue, fever and unexpected weight change.  HENT: Negative for congestion.   Eyes: Positive for discharge, redness and itching. Negative for photophobia, pain and visual disturbance.  Respiratory: Negative for cough, shortness of breath and wheezing.   Cardiovascular: Positive for leg swelling. Negative for chest pain and palpitations.  Gastrointestinal: Negative for abdominal distention, abdominal pain, constipation and diarrhea.  Genitourinary: Negative for difficulty urinating and dysuria.  Musculoskeletal: Positive for arthralgias and gait problem. Negative for back pain, joint swelling and myalgias.  Skin: Positive for color change and wound.  Neurological: Negative for dizziness, tremors, seizures, syncope, facial asymmetry, speech difficulty, weakness, light-headedness, numbness and headaches.  Psychiatric/Behavioral: Positive for agitation, behavioral problems and confusion.    Immunization History  Administered Date(s) Administered  . Influenza Inj Mdck Quad Pf 08/05/2016  . Influenza Whole 08/01/2013  . Influenza,inj,Quad PF,6+ Mos 08/08/2018  . Influenza-Unspecified 08/01/2014, 07/31/2015, 08/05/2016, 10/19/2017  . PPD  Test 03/09/2012  . Pneumococcal Conjugate-13 03/04/2015  . Pneumococcal Polysaccharide-23 10/18/1998  . Td 11/04/2011  . Zoster Recombinat (Shingrix) 11/16/2017, 01/27/2018   Pertinent  Health Maintenance Due  Topic Date Due  . URINE MICROALBUMIN  04/18/2018  . OPHTHALMOLOGY EXAM  11/04/2018 (Originally 04/23/2017)  . HEMOGLOBIN A1C  11/03/2018  . FOOT EXAM  06/06/2019  . INFLUENZA VACCINE  Completed  . DEXA SCAN  Completed  . PNA vac Low Risk Adult   Completed   Fall Risk  03/10/2016 09/10/2015 09/02/2014 08/29/2013  Falls in the past year? No No No No  Risk for fall due to : - History of fall(s);Impaired balance/gait;Impaired mobility;Mental status change - -   Functional Status Survey:    There were no vitals filed for this visit. There is no height or weight on file to calculate BMI. Physical Exam  Constitutional: No distress.  HENT:  Head: Normocephalic and atraumatic.  Right Ear: External ear normal.  Left Ear: External ear normal.  Nose: Nose normal.  Mouth/Throat: Oropharynx is clear and moist. No oropharyngeal exudate.  Eyes: Pupils are equal, round, and reactive to light. Right eye exhibits discharge. Right eye exhibits no hordeolum. Left eye exhibits discharge. Left eye exhibits no hordeolum. Right conjunctiva is injected. Right conjunctiva has no hemorrhage. Left conjunctiva is injected. Left conjunctiva has no hemorrhage. No scleral icterus.  Cardiovascular: Normal rate and regular rhythm.  Pulmonary/Chest: Effort normal and breath sounds normal.  Skin: She is not diaphoretic.  Nursing note and vitals reviewed.   Labs reviewed: Recent Labs    09/20/17 0300  NA 139  K 4.1  BUN 12  CREATININE 0.8   Recent Labs    09/20/17 0300  AST 12*  ALT 8  ALKPHOS 114   Recent Labs    09/20/17 0300 05/03/18 08/08/18 0600  WBC 14.3 13.8 13.3  HGB 16.0 17.1* 14.9  HCT 50* 55* 45  PLT 483* 456* 580*   Lab Results  Component Value Date   TSH 3.30 07/19/2017   Lab Results  Component Value Date   HGBA1C 6.7 05/03/2018   Lab Results  Component Value Date   CHOL 173 08/27/2015   HDL 38 08/27/2015   LDLCALC 102 08/27/2015   TRIG 163 (A) 08/27/2015    Significant Diagnostic Results in last 30 days:  No results found.  Assessment/Plan  1. Acute bacterial conjunctivitis of both eyes Gentamicin 0.3% 2 gtts QID x 7 days  Family/ staff Communication: discussed with resident and nurse  Labs/tests ordered:   NA

## 2018-09-11 ENCOUNTER — Other Ambulatory Visit: Payer: Self-pay | Admitting: Adult Health

## 2018-09-11 MED ORDER — MORPHINE SULFATE (CONCENTRATE) 10 MG/0.5ML PO SOLN: 10.0000 mg | Freq: Two times a day (BID) | ORAL | Status: DC | PRN

## 2018-09-11 MED ORDER — LORAZEPAM 2 MG/ML PO CONC: 0.5000 mg | Freq: Two times a day (BID) | ORAL | 1 refills | Status: DC | PRN

## 2018-09-11 MED ORDER — MORPHINE SULFATE (CONCENTRATE) 20 MG/ML PO SOLN: 10.0000 mg | Freq: Three times a day (TID) | ORAL | 0 refills | Status: DC

## 2018-09-11 NOTE — Progress Notes (Signed)
Roxanol increased to TID due to foot pain Ativan changed to prn due to increased agitation after administration

## 2018-10-09 ENCOUNTER — Encounter: Payer: Self-pay | Admitting: Adult Health

## 2018-10-09 ENCOUNTER — Non-Acute Institutional Stay (SKILLED_NURSING_FACILITY): Payer: Medicare Other | Admitting: Adult Health

## 2018-10-09 DIAGNOSIS — I739 Peripheral vascular disease, unspecified: Secondary | ICD-10-CM | POA: Diagnosis not present

## 2018-10-09 DIAGNOSIS — I96 Gangrene, not elsewhere classified: Secondary | ICD-10-CM | POA: Diagnosis not present

## 2018-10-09 MED ORDER — FENTANYL 25 MCG/HR TD PT72: 25.0000 ug | MEDICATED_PATCH | TRANSDERMAL | 0 refills | Status: DC

## 2018-10-09 NOTE — Progress Notes (Signed)
Location:  Occupational psychologist of Service:  SNF (31) Provider:   Cindi Carbon, ANP [place] [phone]   Gayland Curry, DO  Patient Care Team: Gayland Curry, DO as PCP - General (Geriatric Medicine) Community, Well Orpah Greek, MD as Consulting Physician (Gastroenterology) Lorretta Harp, MD as Consulting Physician (Cardiology) Garvin Fila, MD as Consulting Physician (Neurology) Bjorn Loser, MD as Consulting Physician (Urology) Pedro Earls, MD as Attending Physician [facility] Medicine)  Extended Emergency Contact Information Primary Emergency Contact: [place] of [place] Phone: [phone] Relation: Friend Secondary Emergency Contact: [facility]  [place] Phone: [phone] Relation: Other  Code Status:  DNR Goals of care: Advanced Directive information Advanced Directives 08/29/2018  Does Patient Have a Medical Advance Directive? Yes  Type of Paramedic of [place];Living will;Out of facility DNR (pink MOST or yellow form)  Does patient want to make changes to medical advance directive? No - Patient declined  Copy of [place] in Chart? Yes - validated most recent copy scanned in chart (See row information)  Would patient like information on creating a medical advance directive? No - Patient declined  Pre-existing out of facility DNR order (yellow form or pink MOST form) Yellow form placed in chart (order not valid for inpatient use);Pink MOST form placed in chart (order not valid for inpatient use)     Chief Complaint  Patient presents with  . Acute Visit    pain in foot, spitting out     HPI:  Pt is a 84 y.o. female seen today for an acute visit for pain control. 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 She is followed by hospice. The nurse reports  that she is spitting out the scheduled morphine that she takes for pain. She appears to be in worsening pain this weekend when they do dressing changes to the left foot. The left 2nd and 5th toe are necrotic, as well as a small area of necrosis is now noted to the left 3rd toe. The area is reported to have minimal drainage and not progressive redness, fever, etc.    Past Medical History:  Diagnosis Date  . Abnormality of gait 11/18/2008  . Anxiety   . Basilar artery syndrome   . Closed fracture of intertrochanteric section of femur ([place]) 03/04/2010  . Corns and callosities 02/05/2008  . Depressive disorder   . Diabetes mellitus   . Diarrhea 12/13/2012  . Dysphagia   . Edema 10/25/2007  . Fecal smearing 04/05/2012  . Hyperlipidemia   . Hypertension   . Insomnia   . Macular degeneration (senile) of retina, unspecified 03/17/2011  . Neuropathy   . Osteoporosis, senile   . Other atopic dermatitis and related conditions 12/13/2012  . Pain in joint, ankle and foot 08/07/2008  . Pain in joint, pelvic region and thigh 10/2008  . Pain in limb 11/18/2008  . Paroxysmal atrial tachycardia ([place])   . Peripheral vascular disease, unspecified ([facility]) 08/14/2012  . Plantar fascial fibromatosis   . Rash and other nonspecific skin eruption 12/21/2010  . Rickets, active   . Spinal stenosis, unspecified region other than cervical 11/18/2008  . TIA (transient ischemic attack)   . Transient ischemic attack (TIA), and cerebral infarction without residual deficits(V12.54) 10/28/2002  . Type II or unspecified type diabetes mellitus with peripheral circulatory disorders, uncontrolled(250.72) 02/12/2013  . Unspecified  hereditary and idiopathic peripheral neuropathy 03/17/2011  . Unspecified urinary incontinence 05/13/2008  . Venous insufficiency   . Vitamin D deficiency 11/07/2007   Past Surgical History:  Procedure Laterality Date  . BREAST MASS EXCISION    . CHOLECYSTECTOMY  1964  . COLONOSCOPY  12/18/2010   Dr Oletta Lamas  .  DILATION AND CURETTAGE OF UTERUS  2003   post menopausal bleeding. Bicornate uterus/doble cervix, benign path  . ESOPHAGOGASTRODUODENOSCOPY (EGD) WITH PROPOFOL N/A 11/07/2015   Procedure: ESOPHAGOGASTRODUODENOSCOPY (EGD) WITH PROPOFOL;  Surgeon: Carol Ada, MD;  [facility];  Service: Endoscopy;  Laterality: N/A;  . ESOPHAGOGASTRODUODENOSCOPY ENDOSCOPY  12/18/2010   Dr. Oletta Lamas slight gastritis  . HIP FRACTURE SURGERY Right 02/2010  . INCISION / DRAINAGE HAND / FINGER  2001   cat bite  . TONSILLECTOMY AND ADENOIDECTOMY     as a child    Allergies  Allergen Reactions  . Codeine     UNKNOWN  . Tramadol     UNKNOWN  . Vesicare [Solifenacin Succinate]     UNKNOWN    Outpatient Encounter Medications as of 10/09/2018  Medication Sig  . Emollient (EUCERIN) lotion Apply 1 mL topically as needed for dry skin.  . fentaNYL (DURAGESIC - DOSED MCG/HR) 25 MCG/HR patch Place 1 patch (25 mcg total) onto the skin every 3 (three) days.  Marland Kitchen gabapentin (NEURONTIN) 100 MG capsule Take 100 mg by mouth 3 (three) times daily.  Marland Kitchen lactose free nutrition (BOOST) LIQD Take 237 mLs by mouth daily.   Marland Kitchen loperamide (IMODIUM) 2 MG capsule Take 2 mg by mouth as needed for diarrhea or loose stools.  . nystatin cream (MYCOSTATIN) Apply 1 application topically 2 (two) times daily as needed.   Marland Kitchen omeprazole (PRILOSEC) 40 MG capsule Take 40 mg by mouth daily.  . phenylephrine (,USE FOR PREPARATION-H,) 0.25 % suppository Place 1 suppository rectally as needed for hemorrhoids.  . polyethylene glycol (MIRALAX / GLYCOLAX) packet Take 17 g by mouth every other day.  . Protein (UNJURY PO) Take 1 Scoop by mouth daily.  . sertraline (ZOLOFT) 20 MG/ML concentrated solution Take 200 mg by mouth daily.   . sodium fluoride (PREVIDENT) 1.1 % GEL dental gel Place 1 application onto teeth at bedtime.  Marland Kitchen spironolactone (ALDACTONE) 25 MG tablet Take 12.5 mg by mouth daily.   . Valproate Sodium (DEPAKENE) 250 MG/5ML SOLN  solution Take 125 mg by mouth 2 (two) times daily. for agitation/combative behaviors.  . [DISCONTINUED] fentaNYL (DURAGESIC - DOSED MCG/HR) 25 MCG/HR patch Place 25 mcg onto the skin every 3 (three) days.  . [DISCONTINUED] morphine (ROXANOL) 20 MG/ML concentrated solution Take 0.5 mLs (10 mg total) by mouth 3 (three) times daily.  . [DISCONTINUED] LORazepam (ATIVAN) 2 MG/ML concentrated solution Take 0.3 mLs (0.6 mg total) by mouth 2 (two) times daily as needed for anxiety.   Facility-Administered Encounter Medications as of 10/09/2018  Medication  . morphine CONCENTRATE 10 MG/0.5ML oral solution 10 mg    Review of Systems  Constitutional: Positive for unexpected weight change. Negative for activity change, appetite change, chills, diaphoresis, fatigue and fever.  HENT: Negative for congestion.   Respiratory: Negative for cough, shortness of breath and wheezing.   Cardiovascular: Positive for leg swelling. Negative for chest pain and palpitations.  Gastrointestinal: Negative for abdominal distention, abdominal pain, constipation and diarrhea.  Genitourinary: Negative for difficulty urinating and dysuria.  Musculoskeletal: Positive for arthralgias and gait problem. Negative for back pain, joint swelling and myalgias.  Skin: Positive for color  change and wound.  Neurological: Positive for weakness. Negative for dizziness, tremors, seizures, syncope, facial asymmetry, speech difficulty, light-headedness, numbness and headaches.  Psychiatric/Behavioral: Positive for agitation and behavioral problems. Negative for confusion.    Immunization History  Administered Date(s) Administered  . Influenza Inj Mdck Quad Pf 08/05/2016  . Influenza Whole 08/01/2013  . Influenza,inj,Quad PF,6+ Mos 08/08/2018  . Influenza-Unspecified 08/01/2014, 07/31/2015, 08/05/2016, 10/19/2017  . PPD Test 03/09/2012  . Pneumococcal Conjugate-13 03/04/2015  . Pneumococcal Polysaccharide-23 10/18/1998  . Td 11/04/2011    . Zoster Recombinat (Shingrix) 11/16/2017, 01/27/2018   Pertinent  Health Maintenance Due  Topic Date Due  . URINE MICROALBUMIN  04/18/2018  . OPHTHALMOLOGY EXAM  11/04/2018 (Originally 04/23/2017)  . HEMOGLOBIN A1C  11/03/2018  . FOOT EXAM  06/06/2019  . INFLUENZA VACCINE  Completed  . DEXA SCAN  Completed  . PNA vac Low Risk Adult  Completed   Fall Risk  03/10/2016 09/10/2015 09/02/2014 08/29/2013  Falls in the past year? No No No No  Risk for fall due to : - History of fall(s);Impaired balance/gait;Impaired mobility;Mental status change - -   Functional Status Survey:    There were no vitals filed for this visit. There is no height or weight on file to calculate BMI. Physical Exam Vitals signs and nursing note reviewed.  Constitutional:      General: She is not in acute distress.    Appearance: She is not diaphoretic.  HENT:     Head: Normocephalic and atraumatic.  Neck:     Vascular: No JVD.  Cardiovascular:     Rate and Rhythm: Normal rate and regular rhythm.     Heart sounds: No murmur.     Comments: Not able to palpate the left pedal pulse. Right pedal pulse palpable +1 Pulmonary:     Effort: Pulmonary effort is normal. No respiratory distress.     Breath sounds: Normal breath sounds. No wheezing.  Abdominal:     General: Abdomen is flat. Bowel sounds are normal. There is no distension.     Palpations: Abdomen is soft.  Musculoskeletal:     Right lower leg: No edema.     Left lower leg: Edema present.  Skin:    General: Skin is warm and dry.     Comments: LLE with mild erythema that is chronic and dry flaky skin to BLE. Left 2nd and 5th toe are black with a small amt of green drainage noted on the dressing. The left 3rd toe has small amt of black tissue noted to the tip.   Neurological:     General: No focal deficit present.     Mental Status: She is alert. Mental status is at baseline.     Comments: Oriented to self. Able to f/c.  MAE  Psychiatric:         Mood and Affect: Mood normal.     Labs reviewed: No results for input(s): NA, K, CL, CO2, GLUCOSE, BUN, CREATININE, CALCIUM, MG, PHOS in the last 8760 hours. No results for input(s): AST, ALT, ALKPHOS, BILITOT, PROT, ALBUMIN in the last 8760 hours. Recent Labs    05/03/18 08/08/18 0600  WBC 13.8 13.3  HGB 17.1* 14.9  HCT 55* 45  PLT 456* 580*   Lab Results  Component Value Date   TSH 3.30 07/19/2017   Lab Results  Component Value Date   HGBA1C 6.7 05/03/2018   Lab Results  Component Value Date   CHOL 173 08/27/2015   HDL 38 08/27/2015  LDLCALC 102 08/27/2015   TRIG 163 (A) 08/27/2015    Significant Diagnostic Results in last 30 days:  No results found.  Assessment/Plan 1. Dry gangrene (HCC) Worsening with progression in necrotic tissue of the 2nd and 5th toe, as well as new necrotic tissue of the left 3rd toe. Goals of care are comfort based and she is followed by hospice. Will discontinue scheduled morphine and start a Fentanyl patch 25 mcg  change q 3 days (she is spitting out the morphine). Keep prn morphine dosing.  Continue flagyl gel to the wound during dressing changes for odor.  2. Peripheral vascular disease, unspecified ([place]) The cause of #1   Family/ staff Communication: discussed with nurse Tammy  Labs/tests ordered:  NA

## 2018-10-10 ENCOUNTER — Non-Acute Institutional Stay (SKILLED_NURSING_FACILITY): Payer: Medicare Other

## 2018-10-10 DIAGNOSIS — Z0000 Encounter for general adult medical examination without abnormal findings: Secondary | ICD-10-CM | POA: Diagnosis not present

## 2018-10-10 NOTE — Patient Instructions (Signed)
Kara Harris , I have completed the annual wellness visit as per Medicare guidelines. The attached information is provided for the patient's family, care providers, and facility of residence.  Screening recommendations/referrals: Colonoscopy excluded, over age 82 Mammogram excluded, over age 19 Bone Density up to date Recommended yearly ophthalmology/optometry visit for glaucoma screening and checkup Recommended yearly dental visit for hygiene and checkup  Vaccinations: Influenza vaccine up to date, completed Pneumococcal vaccine up to date, completed Tdap vaccine up to date, due 11/03/2021 Shingles vaccine up to date, completed    Advanced directives: in chart  Conditions/risks identified: Fall Risk  Next appointment: Dr. Mariea Clonts makes rounds   Preventive Care 57 Years and Older, Female Preventive care refers to lifestyle choices and visits with your health care provider that can promote health and wellness. What does preventive care include?  A yearly physical exam. This is also called an annual well check.  Dental exams once or twice a year.  Routine eye exams. Ask your health care provider how often you should have your eyes checked.  Personal lifestyle choices, including:  Daily care of your teeth and gums.  Regular physical activity.  Eating a healthy diet.  Avoiding tobacco and drug use.  Limiting alcohol use.  Taking vitamin and mineral supplements as recommended by your health care provider. What happens during an annual well check? The services and screenings done by your health care provider during your annual well check will depend on your age, overall health, lifestyle risk factors, and family history of disease. Counseling  Your health care provider may ask you questions about your:  Alcohol use.  Tobacco use.  Drug use.  Emotional well-being.  Home and relationship well-being.  Eating habits.  History of falls.  Memory and ability to understand  (cognition).  Work and work Statistician.  Reproductive health. Screening  You may have the following tests or measurements:  Height, weight, and BMI.  Blood pressure.  Lipid and cholesterol levels. These may be checked every 5 years, or more frequently if you are over 49 years old.  Skin check.  Lung cancer screening. You may have this screening every year starting at age 51 if you have a 30-pack-year history of smoking and currently smoke or have quit within the past 15 years.  Fecal occult blood test (FOBT) of the stool. You may have this test every year starting at age 28.  Flexible sigmoidoscopy or colonoscopy. You may have a sigmoidoscopy every 5 years or a colonoscopy every 10 years starting at age 14.  Hepatitis C blood test.  Hepatitis B blood test.  Diabetes screening. This is done by checking your blood sugar (glucose) after you have not eaten for a while (fasting). You may have this done every 1-3 years.  Bone density scan. This is done to screen for osteoporosis. You may have this done starting at age 40.  Mammogram. This may be done every 1-2 years. Talk to your health care provider about how often you should have regular mammograms. Talk with your health care provider about your test results, treatment options, and if necessary, the need for more tests. Vaccines  Your health care provider may recommend certain vaccines, such as:  Influenza vaccine. This is recommended every year.  Tetanus, diphtheria, and acellular pertussis (Tdap, Td) vaccine. You may need a Td booster every 10 years.  Zoster vaccine. You may need this after age 82.  Pneumococcal 13-valent conjugate (PCV13) vaccine. One dose is recommended after age 64.  Pneumococcal  polysaccharide (PPSV23) vaccine. One dose is recommended after age 33. Talk to your health care provider about which screenings and vaccines you need and how often you need them. This information is not intended to replace  advice given to you by your health care provider. Make sure you discuss any questions you have with your health care provider. Document Released: 10/31/2015 Document Revised: 06/23/2016 Document Reviewed: 08/05/2015 Elsevier Interactive Patient Education  2017 [place] can cause injuries. They can happen to people of all ages. There are many things you can do to make your home safe and to help prevent falls.  What can I do in the bathroom?  Use night lights.  Install grab bars by the toilet and in the tub and shower. Do not use towel bars as grab bars.  Use non-skid mats or decals in the tub or shower.  If you need to sit down in the shower, use a plastic, non-slip stool.  Keep the floor dry. Clean up any water that spills on the floor as soon as it happens.  Remove soap buildup in the tub or shower regularly.  Attach bath mats securely with double-sided non-slip rug tape.  Do not have throw rugs and other things on the floor that can make you trip. What can I do in the bedroom?  Use night lights.  Make sure that you have a light by your bed that is easy to reach.  Do not use any sheets or blankets that are too big for your bed. They should not hang down onto the floor.  Have a firm chair that has side arms. You can use this for support while you get dressed.  Do not have throw rugs and other things on the floor that can make you trip. What else can I do to help prevent falls?  Wear shoes that:  Do not have high heels.  Have rubber bottoms.  Are comfortable and fit you well.  Are closed at the toe. Do not wear sandals.  If you use a stepladder:  Make sure that it is fully opened. Do not climb a closed stepladder.  Make sure that both sides of the stepladder are locked into place.  Ask someone to hold it for you, if possible.  Clearly mark and make sure that you can see:  Any grab bars or handrails.  First and last  steps.  Where the edge of each step is.  Use tools that help you move around (mobility aids) if they are needed. These include:  Canes.  Walkers.  Scooters.  Crutches.  Turn on the lights when you go into a dark area. Replace any light bulbs as soon as they burn out.  Set up your furniture so you have a clear path. Avoid moving your furniture around.  If any of your floors are uneven, fix them.  Review your medicines with your doctor. Some medicines can make you feel dizzy. This can increase your chance of falling. Ask your doctor what other things that you can do to help prevent falls. This information is not intended to replace advice given to you by your health care provider. Make sure you discuss any questions you have with your health care provider. Document Released: 07/31/2009 Document Revised: 03/11/2016 Document Reviewed: 11/08/2014 Elsevier Interactive Patient Education  2017 [employer].

## 2018-10-10 NOTE — Progress Notes (Signed)
Subjective:   Kara Harris is a 84 y.o. female who presents for Medicare Annual (Subsequent) preventive examination at [place], hospice patient; incapacitated patient unable to answer questions appropriately. History, medication list, and ADL's verified by medical record/family.   Last AWV-09/27/2017    Objective:     Vitals: BP 125/78 (BP Location: Left Arm, Patient Position: Sitting)   Pulse 70   Temp 98.2 F (36.8 C) (Oral)   Ht 5\' 7"  (1.702 m)   Wt 129 lb (58.5 kg)   BMI 20.20 kg/m   Body mass index is 20.2 kg/m.  Advanced Directives 10/10/2018 08/29/2018 08/15/2018 06/08/2018 02/14/2018 11/15/2017 09/27/2017  Does Patient Have a Medical Advance Directive? Yes Yes Yes Yes Yes Yes Yes  Type of Paramedic of [place];Living will;Out of facility DNR (pink MOST or yellow form) [place];Living will;Out of facility DNR (pink MOST or yellow form) [place];Living will;Out of facility DNR (pink MOST or yellow form) [place];Living will;Out of facility DNR (pink MOST or yellow form) Out of facility DNR (pink MOST or yellow form);[place];Living will Out of facility DNR (pink MOST or yellow form);[place];Living will [place];Living will;Out of facility DNR (pink MOST or yellow form)  Does patient want to make changes to medical advance directive? No - Patient declined No - Patient declined No - Patient declined No - Patient declined No - Patient declined No - Patient declined No - Patient declined  Copy of [place] in Chart? Yes - validated most recent copy scanned in chart (See row information) Yes - validated most recent copy scanned in chart (See row information) Yes Yes Yes Yes Yes  Would patient like information on creating a medical advance directive? No - Patient declined No - Patient declined No - Patient  declined - - - -  Pre-existing out of facility DNR order (yellow form or pink MOST form) Yellow form placed in chart (order not valid for inpatient use);Pink MOST form placed in chart (order not valid for inpatient use) Yellow form placed in chart (order not valid for inpatient use);Pink MOST form placed in chart (order not valid for inpatient use) Yellow form placed in chart (order not valid for inpatient use);Pink MOST form placed in chart (order not valid for inpatient use) Yellow form placed in chart (order not valid for inpatient use);Pink MOST form placed in chart (order not valid for inpatient use) Yellow form placed in chart (order not valid for inpatient use);Pink MOST form placed in chart (order not valid for inpatient use) Yellow form placed in chart (order not valid for inpatient use);Pink MOST form placed in chart (order not valid for inpatient use) Yellow form placed in chart (order not valid for inpatient use)    Tobacco Social History   Tobacco Use  Smoking Status Never Smoker  Smokeless Tobacco Never Used     Counseling given: Not Answered   Clinical Intake:  Pre-visit preparation completed: No  Pain : No/denies pain     Nutritional Risks: None Diabetes: Yes CBG done?: No Did pt. bring in CBG monitor from home?: No  How often do you need to have someone help you when you read instructions, pamphlets, or other written materials from your doctor or pharmacy?: 4 - Often  Interpreter Needed?: No  Information entered by :: Tyson Dense, RN  Past Medical History:  Diagnosis Date  . Abnormality of  gait 11/18/2008  . Anxiety   . Basilar artery syndrome   . Closed fracture of intertrochanteric section of femur ([place]) 03/04/2010  . Corns and callosities 02/05/2008  . Depressive disorder   . Diabetes mellitus   . Diarrhea 12/13/2012  . Dysphagia   . Edema 10/25/2007  . Fecal smearing 04/05/2012  . Hyperlipidemia   . Hypertension   . Insomnia   . Macular degeneration  (senile) of retina, unspecified 03/17/2011  . Neuropathy   . Osteoporosis, senile   . Other atopic dermatitis and related conditions 12/13/2012  . Pain in joint, ankle and foot 08/07/2008  . Pain in joint, pelvic region and thigh 10/2008  . Pain in limb 11/18/2008  . Paroxysmal atrial tachycardia ([place])   . Peripheral vascular disease, unspecified ([place]) 08/14/2012  . Plantar fascial fibromatosis   . Rash and other nonspecific skin eruption 12/21/2010  . Rickets, active   . Spinal stenosis, unspecified region other than cervical 11/18/2008  . TIA (transient ischemic attack)   . Transient ischemic attack (TIA), and cerebral infarction without residual deficits(V12.54) 10/28/2002  . Type II or unspecified type diabetes mellitus with peripheral circulatory disorders, uncontrolled(250.72) 02/12/2013  . Unspecified hereditary and idiopathic peripheral neuropathy 03/17/2011  . Unspecified urinary incontinence 05/13/2008  . Venous insufficiency   . Vitamin D deficiency 11/07/2007   Past Surgical History:  Procedure Laterality Date  . BREAST MASS EXCISION    . CHOLECYSTECTOMY  1964  . COLONOSCOPY  12/18/2010   Dr Oletta Lamas  . DILATION AND CURETTAGE OF UTERUS  2003   post menopausal bleeding. Bicornate uterus/doble cervix, benign path  . ESOPHAGOGASTRODUODENOSCOPY (EGD) WITH PROPOFOL N/A 11/07/2015   Procedure: ESOPHAGOGASTRODUODENOSCOPY (EGD) WITH PROPOFOL;  Surgeon: Carol Ada, MD;  Location: [facility];  Service: Endoscopy;  Laterality: N/A;  . ESOPHAGOGASTRODUODENOSCOPY ENDOSCOPY  12/18/2010   Dr. Oletta Lamas slight gastritis  . HIP FRACTURE SURGERY Right 02/2010  . INCISION / DRAINAGE HAND / FINGER  2001   cat bite  . TONSILLECTOMY AND ADENOIDECTOMY     as a child   Family History  Problem Relation Age of Onset  . Diabetes Brother   . Stroke Mother    Social History   Socioeconomic History  . Marital status: Widowed    Spouse name: Not on file  . Number of children: Not on file  . Years of  education: Not on file  . Highest education level: Not on file  Occupational History  . Not on file  Social Needs  . Financial resource strain: Not on file  . Food insecurity:    Worry: Not on file    Inability: Not on file  . Transportation needs:    Medical: Not on file    Non-medical: Not on file  Tobacco Use  . Smoking status: Never Smoker  . Smokeless tobacco: Never Used  Substance and Sexual Activity  . Alcohol use: No  . Drug use: No  . Sexual activity: Never  Lifestyle  . Physical activity:    Days per week: Not on file    Minutes per session: Not on file  . Stress: Not on file  Relationships  . Social connections:    Talks on phone: Not on file    Gets together: Not on file    Attends religious service: Not on file    Active member of club or organization: Not on file    Attends meetings of clubs or organizations: Not on file    Relationship status: Not on  file  Other Topics Concern  . Not on file  Social History Narrative   Patient is Widowed. No children. 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 Emollient (EUCERIN) lotion Apply 1 mL topically as needed for dry skin.  . fentaNYL (DURAGESIC - DOSED MCG/HR) 25 MCG/HR patch Place 1 patch (25 mcg total) onto the skin every 3 (three) days.  Marland Kitchen gabapentin (NEURONTIN) 100 MG capsule Take 100 mg by mouth 3 (three) times daily.  Marland Kitchen lactose free nutrition (BOOST) LIQD Take 237 mLs by mouth daily.   Marland Kitchen loperamide (IMODIUM) 2 MG capsule Take 2 mg by mouth as needed for diarrhea or loose stools.  . nystatin cream (MYCOSTATIN) Apply 1 application topically 2 (two) times daily as needed.   Marland Kitchen omeprazole (PRILOSEC) 40 MG capsule Take 40 mg by mouth daily.   . phenylephrine (,USE FOR PREPARATION-H,) 0.25 % suppository Place 1 suppository rectally as needed for hemorrhoids.  . polyethylene glycol (MIRALAX / GLYCOLAX) packet Take 17 g by mouth every other day.  . Protein (UNJURY PO) Take 1 Scoop by mouth daily.  . sertraline (ZOLOFT) 20 MG/ML concentrated solution Take 200 mg by mouth daily.   . sodium fluoride (PREVIDENT) 1.1 % GEL dental gel Place 1 application onto teeth at bedtime.  Marland Kitchen spironolactone (ALDACTONE) 25 MG tablet Take 12.5 mg by mouth daily.   . Valproate Sodium (DEPAKENE) 250 MG/5ML SOLN solution Take 125 mg by mouth 2 (two) times daily. for agitation/combative behaviors.   Facility-Administered Encounter Medications as of 10/10/2018  Medication  . morphine CONCENTRATE 10 MG/0.5ML oral solution 10 mg    Activities of Daily Living In your present state of health, do you have any difficulty performing the following activities: 10/10/2018  Hearing? Y  Vision? (No Data)  Comment unable to assess  Difficulty concentrating or making decisions? Y  Walking or climbing stairs? Y  Dressing or bathing? Y  Doing errands, shopping? Y  Preparing Food and eating ? Y  Using the Toilet? Y  In the past six months, have you accidently leaked urine? Y  Do you have problems with loss of bowel control? Y  Managing your Medications? Y  Managing your Finances? Y  Housekeeping or managing your Housekeeping? Y  Some recent data might be hidden    Patient Care Team: Gayland Curry, DO as PCP - General (Geriatric Medicine) [facility] Orpah Greek, MD as Consulting Physician (Gastroenterology) Lorretta Harp, MD as Consulting Physician (Cardiology) Garvin Fila, MD as Consulting Physician (Neurology) Bjorn Loser, MD as Consulting Physician (Urology) Pedro Earls, MD as Attending Physician (Family Medicine)    Assessment:   This is a routine wellness examination for [facility].  Exercise Activities and  Dietary recommendations Current Exercise Habits: The patient does not participate in regular exercise at present, Exercise limited by: orthopedic condition(s);neurologic condition(s)  Goals   None     Fall Risk Fall Risk  10/10/2018 03/10/2016 09/10/2015 09/02/2014 08/29/2013  Falls in the past year? 0 No No No No  Number falls in past yr: 0 - - - -  Injury with Fall? 0 - - - -  Risk for fall due to : - -  History of fall(s);Impaired balance/gait;Impaired mobility;Mental status change - -   Is the patient's home free of loose throw rugs in walkways, pet beds, electrical cords, etc?   yes      Grab bars in the bathroom? yes      Handrails on the stairs?   yes      Adequate lighting?   yes   Depression Screen PHQ 2/9 Scores 10/10/2018 03/10/2016 09/10/2015 09/02/2014  PHQ - 2 Score 0 0 3 4  PHQ- 9 Score - - 7 -     Cognitive Function MMSE - Mini Mental State Exam 03/01/2018 09/10/2015  Orientation to time 0 2  Orientation to Place 1 5  Registration 3 3  Attention/ Calculation 0 5  Recall 0 3  Language- name 2 objects 2 2  Language- repeat 1 1  Language- follow 3 step command 3 3  Language- read & follow direction 1 1  Write a sentence 1 1  Copy design 0 0  Total score 12 26        Immunization History  Administered Date(s) Administered  . Influenza Inj Mdck Quad Pf 08/05/2016  . Influenza Whole 08/01/2013  . Influenza,inj,Quad PF,6+ Mos 08/08/2018  . Influenza-Unspecified 08/01/2014, 07/31/2015, 08/05/2016, 10/19/2017  . PPD Test 03/09/2012  . Pneumococcal Conjugate-13 03/04/2015  . Pneumococcal Polysaccharide-23 10/18/1998  . Td 11/04/2011  . Zoster Recombinat (Shingrix) 11/16/2017, 01/27/2018    Qualifies for Shingles Vaccine? Up to date, completed  Screening Tests Health Maintenance  Topic Date Due  . URINE MICROALBUMIN  04/18/2018  . OPHTHALMOLOGY EXAM  11/04/2018 (Originally 04/23/2017)  . HEMOGLOBIN A1C  11/03/2018  . FOOT EXAM  06/06/2019  .  TETANUS/TDAP  11/03/2021  . INFLUENZA VACCINE  Completed  . DEXA SCAN  Completed  . PNA vac Low Risk Adult  Completed    Cancer Screenings: Lung: Low Dose CT Chest recommended if Age 17-80 years, 30 pack-year currently smoking OR have quit w/in 15years. Patient does not qualify. Breast:  Up to date on Mammogram? Yes   Up to date of Bone Density/Dexa? Yes Colorectal: up to date  Additional Screenings:  Hepatitis C Screening: unable to appropriately accept or decline     Plan:    I have personally reviewed and addressed the Medicare Annual Wellness questionnaire and have noted the following in the patient's chart:  A. Medical and social history B. Use of alcohol, tobacco or illicit drugs  C. Current medications and supplements D. Functional ability and status E.  Nutritional status F.  Physical activity G. Advance directives H. List of other physicians I.  Hospitalizations, surgeries, and ER visits in previous 12 months J.  [place] to include hearing, vision, cognitive, depression L. Referrals and appointments - none  In addition, I am unable to review and discuss with incapacitated patient certain preventive protocols, quality metrics, and best practice recommendations. A written personalized care plan for preventive services as well as general preventive health recommendations were provided to facility of residence.   See attached scanned questionnaire for additional information.   Signed,   Tyson Dense, RN Nurse Health Advisor

## 2018-10-19 ENCOUNTER — Other Ambulatory Visit: Payer: Self-pay | Admitting: Adult Health

## 2018-10-19 MED ORDER — FENTANYL 25 MCG/HR TD PT72: 25.0000 ug | MEDICATED_PATCH | TRANSDERMAL | 0 refills | Status: DC

## 2018-11-02 ENCOUNTER — Non-Acute Institutional Stay (SKILLED_NURSING_FACILITY): Payer: Medicare Other | Admitting: Adult Health

## 2018-11-02 ENCOUNTER — Encounter: Payer: Self-pay | Admitting: Adult Health

## 2018-11-02 DIAGNOSIS — F0281 Dementia in other diseases classified elsewhere with behavioral disturbance: Secondary | ICD-10-CM | POA: Diagnosis not present

## 2018-11-02 DIAGNOSIS — I96 Gangrene, not elsewhere classified: Secondary | ICD-10-CM

## 2018-11-02 DIAGNOSIS — R5383 Other fatigue: Secondary | ICD-10-CM

## 2018-11-02 DIAGNOSIS — G301 Alzheimer's disease with late onset: Secondary | ICD-10-CM | POA: Diagnosis not present

## 2018-11-02 MED ORDER — MORPHINE SULFATE (CONCENTRATE) 20 MG/ML PO SOLN: 5.0000 mg | ORAL | 0 refills | Status: DC | PRN

## 2018-11-02 MED ORDER — MORPHINE SULFATE (CONCENTRATE) 20 MG/ML PO SOLN: 5.0000 mg | Freq: Two times a day (BID) | ORAL | 0 refills | Status: DC

## 2018-11-02 NOTE — Progress Notes (Signed)
Location:  Occupational psychologist of Service:  SNF (31) Provider:   Cindi Carbon, ANP [place] [phone]   Gayland Curry, DO  Patient Care Team: Gayland Curry, DO as PCP - General (Geriatric Medicine) Community, Well Orpah Greek, MD as Consulting Physician (Gastroenterology) Lorretta Harp, MD as Consulting Physician (Cardiology) Garvin Fila, MD as Consulting Physician (Neurology) Bjorn Loser, MD as Consulting Physician (Urology) Pedro Earls, MD as Attending Physician [facility] Medicine)  Extended Emergency Contact Information Primary Emergency Contact: Allena Earing [place] of [place] Phone: [phone] Relation: Friend Secondary Emergency Contact: [facility]  [place] Phone: [phone] Relation: Other  Code Status:  DNR Goals of care: Advanced Directive information Advanced Directives 10/10/2018  Does Patient Have a Medical Advance Directive? Yes  Type of Paramedic of [place];Living will;Out of facility DNR (pink MOST or yellow form)  Does patient want to make changes to medical advance directive? No - Patient declined  Copy of [place] in Chart? Yes - validated most recent copy scanned in chart (See row information)  Would patient like information on creating a medical advance directive? No - Patient declined  Pre-existing out of facility DNR order (yellow form or pink MOST form) Yellow form placed in chart (order not valid for inpatient use);Pink MOST form placed in chart (order not valid for inpatient use)     Chief Complaint  Patient presents with  . Acute Visit    not eating    HPI:  Pt is a 85 y.o. female seen today for an acute visit for decrease intake and lethargy. The nurse reports she has not had a meal since 1/14 in the evening where she consumed small amts. She has not had any fluid  intake today and a few sips only in the past two days. She is now refusing meds and refuses to have her bp taken. She is weaker and sleeping more and is not able to be lifted into her chair. She is followed by hospice due to progressive dementia, as well as PVD with associated dry gangrene to the left foot. She is moaning for my visit when I assess her feet but not able to communicate her needs. She has a DNR and a most form indicating comfort care. The CNA reports that she has moved her bowels and had a small amt of urine in her brief this morning.   Functional status: hoyer lift, incontent Past Medical History:  Diagnosis Date  . Abnormality of gait 11/18/2008  . Anxiety   . Basilar artery syndrome   . Closed fracture of intertrochanteric section of femur ([place]) 03/04/2010  . Corns and callosities 02/05/2008  . Depressive disorder   . Diabetes mellitus   . Diarrhea 12/13/2012  . Dysphagia   . Edema 10/25/2007  . Fecal smearing 04/05/2012  . Hyperlipidemia   . Hypertension   . Insomnia   . Macular degeneration (senile) of retina, unspecified 03/17/2011  . Neuropathy   . Osteoporosis, senile   . Other atopic dermatitis and related conditions 12/13/2012  . Pain in joint, ankle and foot 08/07/2008  . Pain in joint, pelvic region and thigh 10/2008  . Pain in limb 11/18/2008  . Paroxysmal atrial tachycardia ([place])   . Peripheral vascular disease, unspecified ([place]) 08/14/2012  . Plantar fascial fibromatosis   . Rash and other nonspecific skin eruption 12/21/2010  . Rickets, active   . Spinal  stenosis, unspecified region other than cervical 11/18/2008  . TIA (transient ischemic attack)   . Transient ischemic attack (TIA), and cerebral infarction without residual deficits(V12.54) 10/28/2002  . Type II or unspecified type diabetes mellitus with peripheral circulatory disorders, uncontrolled(250.72) 02/12/2013  . Unspecified hereditary and idiopathic peripheral neuropathy 03/17/2011  . Unspecified urinary  incontinence 05/13/2008  . Venous insufficiency   . Vitamin D deficiency 11/07/2007   Past Surgical History:  Procedure Laterality Date  . BREAST MASS EXCISION    . CHOLECYSTECTOMY  1964  . COLONOSCOPY  12/18/2010   Dr Oletta Lamas  . DILATION AND CURETTAGE OF UTERUS  2003   post menopausal bleeding. Bicornate uterus/doble cervix, benign path  . ESOPHAGOGASTRODUODENOSCOPY (EGD) WITH PROPOFOL N/A 11/07/2015   Procedure: ESOPHAGOGASTRODUODENOSCOPY (EGD) WITH PROPOFOL;  Surgeon: Carol Ada, MD;  Location: [facility] END[facility];  Service: Endoscopy;  Laterality: N/A;  . ESOPHAGOGASTRODUODENOSCOPY ENDOSCOPY  12/18/2010   Dr. Oletta Lamas slight gastritis  . HIP FRACTURE SURGERY Right 02/2010  . INCISION / DRAINAGE HAND / FINGER  2001   cat bite  . TONSILLECTOMY AND ADENOIDECTOMY     as a child    Allergies  Allergen Reactions  . Codeine     UNKNOWN  . Tramadol     UNKNOWN  . Vesicare [Solifenacin Succinate]     UNKNOWN    Outpatient Encounter Medications as of 11/02/2018  Medication Sig  . morphine (ROXANOL) 20 MG/ML concentrated solution Take 0.25 mLs (5 mg total) by mouth 2 (two) times daily.  Marland Kitchen morphine (ROXANOL) 20 MG/ML concentrated solution Take 0.25 mLs (5 mg total) by mouth every 4 (four) hours as needed for severe pain.  . [DISCONTINUED] morphine (ROXANOL) 20 MG/ML concentrated solution Take 5 mg by mouth every 4 (four) hours as needed for severe pain.  . [DISCONTINUED] morphine (ROXANOL) 20 MG/ML concentrated solution Take 5 mg by mouth 2 (two) times daily.  . Emollient (EUCERIN) lotion Apply 1 mL topically as needed for dry skin.  . fentaNYL (DURAGESIC - DOSED MCG/HR) 25 MCG/HR patch Place 1 patch (25 mcg total) onto the skin every 3 (three) days.  Marland Kitchen gabapentin (NEURONTIN) 100 MG capsule Take 100 mg by mouth 3 (three) times daily.  Marland Kitchen lactose free nutrition (BOOST) LIQD Take 237 mLs by mouth daily.   Marland Kitchen loperamide (IMODIUM) 2 MG capsule Take 2 mg by mouth as needed for diarrhea or loose stools.    . nystatin cream (MYCOSTATIN) Apply 1 application topically 2 (two) times daily as needed.   Marland Kitchen omeprazole (PRILOSEC) 40 MG capsule Take 40 mg by mouth daily.  . phenylephrine (,USE FOR PREPARATION-H,) 0.25 % suppository Place 1 suppository rectally as needed for hemorrhoids.  . polyethylene glycol (MIRALAX / GLYCOLAX) packet Take 17 g by mouth every other day.  . Protein (UNJURY PO) Take 1 Scoop by mouth daily.  . sertraline (ZOLOFT) 20 MG/ML concentrated solution Take 200 mg by mouth daily.   . sodium fluoride (PREVIDENT) 1.1 % GEL dental gel Place 1 application onto teeth at bedtime.  Marland Kitchen spironolactone (ALDACTONE) 25 MG tablet Take 12.5 mg by mouth daily.   . Valproate Sodium (DEPAKENE) 250 MG/5ML SOLN solution Take 125 mg by mouth 2 (two) times daily. for agitation/combative behaviors.  . [DISCONTINUED] morphine CONCENTRATE 10 MG/0.5ML oral solution 10 mg    No facility-administered encounter medications on file as of 11/02/2018.     Review of Systems  Unable to perform ROS: Acuity of condition    Immunization History  Administered Date(s) Administered  .  Influenza Inj Mdck Quad Pf 08/05/2016  . Influenza Whole 08/01/2013  . Influenza,inj,Quad PF,6+ Mos 08/08/2018  . Influenza-Unspecified 08/01/2014, 07/31/2015, 08/05/2016, 10/19/2017  . PPD Test 03/09/2012  . Pneumococcal Conjugate-13 03/04/2015  . Pneumococcal Polysaccharide-23 10/18/1998  . Td 11/04/2011  . Zoster Recombinat (Shingrix) 11/16/2017, 01/27/2018   Pertinent  Health Maintenance Due  Topic Date Due  . URINE MICROALBUMIN  04/18/2018  . OPHTHALMOLOGY EXAM  11/04/2018 (Originally 04/23/2017)  . HEMOGLOBIN A1C  11/03/2018  . FOOT EXAM  06/06/2019  . INFLUENZA VACCINE  Completed  . DEXA SCAN  Completed  . PNA vac Low Risk Adult  Completed   Fall Risk  10/10/2018 03/10/2016 09/10/2015 09/02/2014 08/29/2013  Falls in the past year? 0 No No No No  Number falls in past yr: 0 - - - -  Injury with Fall? 0 - - - -  Risk  for fall due to : - - History of fall(s);Impaired balance/gait;Impaired mobility;Mental status change - -   Functional Status Survey:    Vitals:   11/02/18 1549  Pulse: 87  Resp: 20  SpO2: 96%   There is no height or weight on file to calculate BMI. Physical Exam Vitals signs and nursing note reviewed.  Constitutional:      General: She is not in acute distress.    Appearance: She is ill-appearing.     Comments: Frail, thin, pale  HENT:     Nose: Nose normal. No congestion.     Mouth/Throat:     Pharynx: No oropharyngeal exudate.     Comments: Very dry mouth Eyes:     Conjunctiva/sclera: Conjunctivae normal.     Pupils: Pupils are equal, round, and reactive to light.  Cardiovascular:     Rate and Rhythm: Normal rate. Rhythm irregular.     Heart sounds: No murmur.  Pulmonary:     Effort: Pulmonary effort is normal.     Breath sounds: Normal breath sounds.  Abdominal:     General: Abdomen is flat. Bowel sounds are normal. There is no distension.     Palpations: Abdomen is soft.  Skin:    General: Skin is warm and dry.     Coloration: Skin is pale.     Comments: 2nd and 5th toes of the left foot are black. No redness or drainage noted. Unable to palpate pedal pulses bilat.   Neurological:     General: No focal deficit present.     Comments: Oriented to self, not able to follow commands. Sleepy but responds to verbal stim.      Labs reviewed: No results for input(s): NA, K, CL, CO2, GLUCOSE, BUN, CREATININE, CALCIUM, MG, PHOS in the last 8760 hours. No results for input(s): AST, ALT, ALKPHOS, BILITOT, PROT, ALBUMIN in the last 8760 hours. Recent Labs    05/03/18 08/08/18 0600  WBC 13.8 13.3  HGB 17.1* 14.9  HCT 55* 45  PLT 456* 580*   Lab Results  Component Value Date   TSH 3.30 07/19/2017   Lab Results  Component Value Date   HGBA1C 6.7 05/03/2018   Lab Results  Component Value Date   CHOL 173 08/27/2015   HDL 38 08/27/2015   LDLCALC 102 08/27/2015    TRIG 163 (A) 08/27/2015    Significant Diagnostic Results in last 30 days:  No results found.  Assessment/Plan 1. Lethargy With decreased intake and refusal of pills. She appears to be transitioning towards the end of life. Her goals of care are comfort based with  no hospitalizations.   2. Late onset Alzheimer's disease with behavioral disturbance ([place]) Progressive decline in cognition and function with aggressive behaviors physically and verbally at times. She is now not able to take her meds to help control behaviors due to lethargy. Will discontinue all meds except those for comfort.   3. Dry gangrene ([place]) To the left foot which is progressive and causing some discomfort. Will continue Fentanyl 25 mcg patch, but add scheduled roxanol 5 mg BID and increase prn roxanol to 5 mg q 4  Hospice to follow up on resident condition in am Dulcolax supp PR qd prn   Family/ staff Communication: I called her POA, Tom, and let him know of her declining condition. He verbalized understanding and appreciation.   Labs/tests ordered:  NA

## 2018-11-08 ENCOUNTER — Other Ambulatory Visit: Payer: Self-pay | Admitting: Internal Medicine

## 2018-11-08 DIAGNOSIS — I96 Gangrene, not elsewhere classified: Secondary | ICD-10-CM

## 2018-11-08 DIAGNOSIS — I739 Peripheral vascular disease, unspecified: Secondary | ICD-10-CM

## 2018-11-08 MED ORDER — FENTANYL 25 MCG/HR TD PT72: 1.0000 | MEDICATED_PATCH | TRANSDERMAL | 0 refills | Status: DC

## 2018-11-08 NOTE — Progress Notes (Signed)
Rx for fentanyl sent to [place]

## 2018-11-20 ENCOUNTER — Non-Acute Institutional Stay (SKILLED_NURSING_FACILITY): Payer: Medicare Other | Admitting: Adult Health

## 2018-11-20 ENCOUNTER — Encounter: Payer: Self-pay | Admitting: Adult Health

## 2018-11-20 DIAGNOSIS — D471 Chronic myeloproliferative disease: Secondary | ICD-10-CM | POA: Diagnosis not present

## 2018-11-20 DIAGNOSIS — F02818 Dementia in other diseases classified elsewhere, unspecified severity, with other behavioral disturbance: Secondary | ICD-10-CM

## 2018-11-20 DIAGNOSIS — G301 Alzheimer's disease with late onset: Secondary | ICD-10-CM | POA: Diagnosis not present

## 2018-11-20 DIAGNOSIS — R634 Abnormal weight loss: Secondary | ICD-10-CM | POA: Diagnosis not present

## 2018-11-20 DIAGNOSIS — E1143 Type 2 diabetes mellitus with diabetic autonomic (poly)neuropathy: Secondary | ICD-10-CM | POA: Diagnosis not present

## 2018-11-20 DIAGNOSIS — F0281 Dementia in other diseases classified elsewhere with behavioral disturbance: Secondary | ICD-10-CM

## 2018-11-20 DIAGNOSIS — I739 Peripheral vascular disease, unspecified: Secondary | ICD-10-CM

## 2018-11-20 DIAGNOSIS — I96 Gangrene, not elsewhere classified: Secondary | ICD-10-CM

## 2018-11-20 NOTE — Progress Notes (Signed)
Location:  Occupational psychologist of Service:  SNF (31) Provider:   Cindi Carbon, ANP [place] [phone]   Gayland Curry, DO  Patient Care Team: Gayland Curry, DO as PCP - General (Geriatric Medicine) Community, Well Orpah Greek, MD as Consulting Physician (Gastroenterology) Lorretta Harp, MD as Consulting Physician (Cardiology) Garvin Fila, MD as Consulting Physician (Neurology) Bjorn Loser, MD as Consulting Physician (Urology) Pedro Earls, MD as Attending Physician [facility] Medicine)  Extended Emergency Contact Information Primary Emergency Contact: Allena Earing [place] of [place] Phone: [phone] Relation: Friend Secondary Emergency Contact: [facility]  [place] Phone: [phone] Relation: Other  Code Status:  DNR Goals of care: Advanced Directive information Advanced Directives 10/10/2018  Does Patient Have a Medical Advance Directive? Yes  Type of Paramedic of [place];Living will;Out of facility DNR (pink MOST or yellow form)  Does patient want to make changes to medical advance directive? No - Patient declined  Copy of [place] in Chart? Yes - validated most recent copy scanned in chart (See row information)  Would patient like information on creating a medical advance directive? No - Patient declined  Pre-existing out of facility DNR order (yellow form or pink MOST form) Yellow form placed in chart (order not valid for inpatient use);Pink MOST form placed in chart (order not valid for inpatient use)     Chief Complaint  Patient presents with  . Medical Management of Chronic Issues    HPI:  Pt is a 85 y.o. female seen today for medical management of chronic diseases.  She is followed by hospice due to progressive dementia with behaviors, weight loss, chronic pain, PVD, dry gangrene, and  myeloproliferative disorder.  Staff report that after an acute episode of lethargy and decreased appetite last month she recovered and is now eating small amts. She was taken off most of her medications including depakote and neurontin but has not shown any signs of increased pain or agitation. Her weight continues to decrease due to poor intake. She is currently on mechanical soft diet. She hs not been compliant with supplements.  Wt Readings from Last 3 Encounters:  11/20/18 116 lb 9.6 oz (52.9 kg)  10/10/18 129 lb (58.5 kg)  08/29/18 129 lb (58.5 kg)   She has necrotic areas to the left 2nd and 5th toes with a hx of PVD. No reports of increase pain, redness, drainage etc.   Myeloproliferative disorder:  Lab Results  Component Value Date   WBC 13.3 08/08/2018   HGB 14.9 08/08/2018   HCT 45 08/08/2018   MCV 95.9 11/15/2013   PLT 580 (A) 08/08/2018     Past Medical History:  Diagnosis Date  . Abnormality of gait 11/18/2008  . Anxiety   . Basilar artery syndrome   . Closed fracture of intertrochanteric section of femur ([place]) 03/04/2010  . Corns and callosities 02/05/2008  . Depressive disorder   . Diabetes mellitus   . Diarrhea 12/13/2012  . Dysphagia   . Edema 10/25/2007  . Fecal smearing 04/05/2012  . Hyperlipidemia   . Hypertension   . Insomnia   . Macular degeneration (senile) of retina, unspecified 03/17/2011  . Neuropathy   . Osteoporosis, senile   . Other atopic dermatitis and related conditions 12/13/2012  . Pain in joint, ankle and foot 08/07/2008  . Pain in joint, pelvic region and thigh 10/2008  . Pain in limb 11/18/2008  .  Paroxysmal atrial tachycardia ([place])   . Peripheral vascular disease, unspecified ([place]) 08/14/2012  . Plantar fascial fibromatosis   . Rash and other nonspecific skin eruption 12/21/2010  . Rickets, active   . Spinal stenosis, unspecified region other than cervical 11/18/2008  . TIA (transient ischemic attack)   . Transient ischemic attack (TIA), and  cerebral infarction without residual deficits(V12.54) 10/28/2002  . Type II or unspecified type diabetes mellitus with peripheral circulatory disorders, uncontrolled(250.72) 02/12/2013  . Unspecified hereditary and idiopathic peripheral neuropathy 03/17/2011  . Unspecified urinary incontinence 05/13/2008  . Venous insufficiency   . Vitamin D deficiency 11/07/2007   Past Surgical History:  Procedure Laterality Date  . BREAST MASS EXCISION    . CHOLECYSTECTOMY  1964  . COLONOSCOPY  12/18/2010   Dr Oletta Lamas  . DILATION AND CURETTAGE OF UTERUS  2003   post menopausal bleeding. Bicornate uterus/doble cervix, benign path  . ESOPHAGOGASTRODUODENOSCOPY (EGD) WITH PROPOFOL N/A 11/07/2015   Procedure: ESOPHAGOGASTRODUODENOSCOPY (EGD) WITH PROPOFOL;  Surgeon: Carol Ada, MD;  [facility];  Service: Endoscopy;  Laterality: N/A;  . ESOPHAGOGASTRODUODENOSCOPY ENDOSCOPY  12/18/2010   Dr. Oletta Lamas slight gastritis  . HIP FRACTURE SURGERY Right 02/2010  . INCISION / DRAINAGE HAND / FINGER  2001   cat bite  . TONSILLECTOMY AND ADENOIDECTOMY     as a child    Allergies  Allergen Reactions  . Codeine     UNKNOWN  . Tramadol     UNKNOWN  . Vesicare [Solifenacin Succinate]     UNKNOWN    Outpatient Encounter Medications as of 11/20/2018  Medication Sig  . atropine 1 % ophthalmic solution Place 4 drops under the tongue 4 (four) times daily as needed.  . bisacodyl (DULCOLAX) 10 MG suppository Place 10 mg rectally daily as needed for moderate constipation.  . fentaNYL (DURAGESIC) 25 MCG/HR Place 1 patch onto the skin every 3 (three) days.  Marland Kitchen morphine (ROXANOL) 20 MG/ML concentrated solution Take 0.25 mLs (5 mg total) by mouth 2 (two) times daily.  Marland Kitchen morphine (ROXANOL) 20 MG/ML concentrated solution Take 0.25 mLs (5 mg total) by mouth every 4 (four) hours as needed for severe pain.  . Emollient (EUCERIN) lotion Apply 1 mL topically as needed for dry skin.  Marland Kitchen lactose free nutrition (BOOST) LIQD Take  237 mLs by mouth daily.   Marland Kitchen loperamide (IMODIUM) 2 MG capsule Take 2 mg by mouth as needed for diarrhea or loose stools.  . nystatin cream (MYCOSTATIN) Apply 1 application topically 2 (two) times daily as needed.   . phenylephrine (,USE FOR PREPARATION-H,) 0.25 % suppository Place 1 suppository rectally as needed for hemorrhoids.  . polyethylene glycol (MIRALAX / GLYCOLAX) packet Take 17 g by mouth every other day.  . sodium fluoride (PREVIDENT) 1.1 % GEL dental gel Place 1 application onto teeth at bedtime.  . [DISCONTINUED] gabapentin (NEURONTIN) 100 MG capsule Take 100 mg by mouth 3 (three) times daily.  . [DISCONTINUED] omeprazole (PRILOSEC) 40 MG capsule Take 40 mg by mouth daily.  . [DISCONTINUED] Protein (UNJURY PO) Take 1 Scoop by mouth daily.  . [DISCONTINUED] sertraline (ZOLOFT) 20 MG/ML concentrated solution Take 200 mg by mouth daily.   . [DISCONTINUED] spironolactone (ALDACTONE) 25 MG tablet Take 12.5 mg by mouth daily.   . [DISCONTINUED] Valproate Sodium (DEPAKENE) 250 MG/5ML SOLN solution Take 125 mg by mouth 2 (two) times daily. for agitation/combative behaviors.   No facility-administered encounter medications on file as of 11/20/2018.     Review of Systems  Unable to perform ROS: Dementia    Immunization History  Administered Date(s) Administered  . Influenza Inj Mdck Quad Pf 08/05/2016  . Influenza Whole 08/01/2013  . Influenza,inj,Quad PF,6+ Mos 08/08/2018  . Influenza-Unspecified 08/01/2014, 07/31/2015, 08/05/2016, 10/19/2017  . PPD Test 03/09/2012  . Pneumococcal Conjugate-13 03/04/2015  . Pneumococcal Polysaccharide-23 10/18/1998  . Td 11/04/2011  . Zoster Recombinat (Shingrix) 11/16/2017, 01/27/2018   Pertinent  Health Maintenance Due  Topic Date Due  . OPHTHALMOLOGY EXAM  04/23/2017  . URINE MICROALBUMIN  04/18/2018  . HEMOGLOBIN A1C  11/03/2018  . FOOT EXAM  06/06/2019  . INFLUENZA VACCINE  Completed  . DEXA SCAN  Completed  . PNA vac Low Risk Adult   Completed   Fall Risk  10/10/2018 03/10/2016 09/10/2015 09/02/2014 08/29/2013  Falls in the past year? 0 No No No No  Number falls in past yr: 0 - - - -  Injury with Fall? 0 - - - -  Risk for fall due to : - - History of fall(s);Impaired balance/gait;Impaired mobility;Mental status change - -   Functional Status Survey:    Vitals:   11/20/18 1544  Weight: 116 lb 9.6 oz (52.9 kg)   Body mass index is 18.26 kg/m. Physical Exam Vitals signs and nursing note reviewed.  Constitutional:      General: She is not in acute distress.    Appearance: She is not diaphoretic.     Comments: Frail and thin  HENT:     Head: Normocephalic and atraumatic.     Mouth/Throat:     Pharynx: No oropharyngeal exudate.     Comments: Very poor dentition with erythema to gums Neck:     Vascular: No JVD.  Cardiovascular:     Rate and Rhythm: Normal rate and regular rhythm.     Heart sounds: No murmur.  Pulmonary:     Effort: Pulmonary effort is normal. No respiratory distress.     Breath sounds: Normal breath sounds. No wheezing.  Abdominal:     General: Abdomen is flat. Bowel sounds are normal.     Palpations: Abdomen is soft.  Skin:    General: Skin is warm and dry.     Comments: Left 2nd and 5th toes black. Not able to palpate pedal pulse bilaterally. Mild edema to both feet with rubor discoloration which is chronic.   Neurological:     General: No focal deficit present.     Mental Status: Mental status is at baseline.     Comments: Oriented to self and place, sleepy but arouses easily  Psychiatric:        Mood and Affect: Mood normal.     Labs reviewed: No results for input(s): NA, K, CL, CO2, GLUCOSE, BUN, CREATININE, CALCIUM, MG, PHOS in the last 8760 hours. No results for input(s): AST, ALT, ALKPHOS, BILITOT, PROT, ALBUMIN in the last 8760 hours. Recent Labs    05/03/18 08/08/18 0600  WBC 13.8 13.3  HGB 17.1* 14.9  HCT 55* 45  PLT 456* 580*   Lab Results  Component Value Date     TSH 3.30 07/19/2017   Lab Results  Component Value Date   HGBA1C 6.7 05/03/2018   Lab Results  Component Value Date   CHOL 173 08/27/2015   HDL 38 08/27/2015   LDLCALC 102 08/27/2015   TRIG 163 (A) 08/27/2015    Significant Diagnostic Results in last 30 days:  No results found.  Assessment/Plan 1. Weight loss Due to progressive decline in dementia with poor  intake Continue comfort care and hospice  2. Late onset Alzheimer's disease with behavioral disturbance ([place]) Seems to have recovered from an acute episode last month of lethargy. No issues with behaviors reported despite being off depakote.   3. Myeloproliferative disorder ([place]) No longer receiving labs monitoring due to goals of care.   4. Controlled type 2 diabetes mellitus with diabetic autonomic neuropathy, without long-term current use of insulin (HCC) Controlled, goal <8%  Avoiding frequent lab draws or finger sticks  5. Peripheral vascular disease, unspecified ([place]) Has chronic leg pain controlled with Fentanyl patch and scheduled roxanol  6. Dry gangrene ([place]) Continue with necrotic toes but no issues of increase pain or fever. Providing comfort care. Family and staff have been educated on course of illness and anticipation of loss of her toes.     Family/ staff Communication: discussed with her nurse  Labs/tests ordered:  Comfort care

## 2018-11-30 ENCOUNTER — Non-Acute Institutional Stay (SKILLED_NURSING_FACILITY): Payer: Medicare Other | Admitting: Adult Health

## 2018-11-30 ENCOUNTER — Encounter: Payer: Self-pay | Admitting: Adult Health

## 2018-11-30 DIAGNOSIS — L539 Erythematous condition, unspecified: Secondary | ICD-10-CM

## 2018-11-30 NOTE — Progress Notes (Signed)
Location:  Occupational psychologist of Service:  SNF (31) Provider:   Cindi Carbon, ANP [place] [phone]   Gayland Curry, DO  Patient Care Team: Gayland Curry, DO as PCP - General (Geriatric Medicine) Community, Well Orpah Greek, MD as Consulting Physician (Gastroenterology) Lorretta Harp, MD as Consulting Physician (Cardiology) Garvin Fila, MD as Consulting Physician (Neurology) Bjorn Loser, MD as Consulting Physician (Urology) Pedro Earls, MD as Attending Physician [facility])  Extended Emergency Contact Information Primary Emergency Contact: Allena Earing [place] Phone: [phone] Relation: Friend Secondary Emergency Contact: [facility]  [place] Phone: [phone] Relation: Other  Code Status:  DNR Goals of care: Advanced Directive information Advanced Directives 10/10/2018  Does Patient Have a Medical Advance Directive? Yes  Type of Paramedic of [place];Living will;Out of facility DNR (pink MOST or yellow form)  Does patient want to make changes to medical advance directive? No - Patient declined  Copy of [place] in Chart? Yes - validated most recent copy scanned in chart (See row information)  Would patient like information on creating a medical advance directive? No - Patient declined  Pre-existing out of facility DNR order (yellow form or pink MOST form) Yellow form placed in chart (order not valid for inpatient use);Pink MOST form placed in chart (order not valid for inpatient use)     Chief Complaint  Patient presents with  . Acute Visit    right leg redness    HPI:  Pt is a 84 y.o. female seen today for an acute visit for right leg redness to the lower calf. Symptoms were first noted on 2/9.  The area is mildly tender. No change in vital signs. No fever or drainage. 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    Past Medical History:  Diagnosis Date  . Abnormality of gait 11/18/2008  . Anxiety   . Basilar artery syndrome   . Closed fracture of intertrochanteric section of femur ([place]) 03/04/2010  . Corns and callosities 02/05/2008  . Depressive disorder   . Diabetes mellitus   . Diarrhea 12/13/2012  . Dysphagia   . Edema 10/25/2007  . Fecal smearing 04/05/2012  . Hyperlipidemia   . Hypertension   . Insomnia   . Macular degeneration (senile) of retina, unspecified 03/17/2011  . Neuropathy   . Osteoporosis, senile   . Other atopic dermatitis and related conditions 12/13/2012  . Pain in joint, ankle and foot 08/07/2008  . Pain in joint, pelvic region and thigh 10/2008  . Pain in limb 11/18/2008  . Paroxysmal atrial tachycardia ([place])   . Peripheral vascular disease, unspecified ([place]) 08/14/2012  . Plantar fascial fibromatosis   . Rash and other nonspecific skin eruption 12/21/2010  . Rickets, active   . Spinal stenosis, unspecified region other than cervical 11/18/2008  . TIA (transient ischemic attack)   . Transient ischemic attack (TIA), and cerebral infarction without residual deficits(V12.54) 10/28/2002  . Type II or unspecified type diabetes mellitus with peripheral circulatory disorders, uncontrolled(250.72) 02/12/2013  . Unspecified hereditary and idiopathic peripheral neuropathy 03/17/2011  . Unspecified urinary incontinence 05/13/2008  . Venous insufficiency   . Vitamin D deficiency 11/07/2007   Past Surgical History:  Procedure Laterality Date  . BREAST MASS EXCISION    . CHOLECYSTECTOMY  1964  . COLONOSCOPY  12/18/2010  Dr Oletta Lamas  . DILATION AND CURETTAGE OF UTERUS  2003   post menopausal bleeding. Bicornate uterus/doble cervix, benign path  . ESOPHAGOGASTRODUODENOSCOPY (EGD) WITH PROPOFOL N/A 11/07/2015   Procedure: ESOPHAGOGASTRODUODENOSCOPY (EGD) WITH PROPOFOL;  Surgeon: Carol Ada, MD;  Location:  [facility];  Service: Endoscopy;  Laterality: N/A;  . ESOPHAGOGASTRODUODENOSCOPY ENDOSCOPY  12/18/2010   Dr. Oletta Lamas slight gastritis  . HIP FRACTURE SURGERY Right 02/2010  . INCISION / DRAINAGE HAND / FINGER  2001   cat bite  . TONSILLECTOMY AND ADENOIDECTOMY     as a child    Allergies  Allergen Reactions  . Codeine     UNKNOWN  . Tramadol     UNKNOWN  . Vesicare [Solifenacin Succinate]     UNKNOWN    Outpatient Encounter Medications as of 11/30/2018  Medication Sig  . atropine 1 % ophthalmic solution Place 4 drops under the tongue 4 (four) times daily as needed.  . bisacodyl (DULCOLAX) 10 MG suppository Place 10 mg rectally daily as needed for moderate constipation.  . Emollient (EUCERIN) lotion Apply 1 mL topically as needed for dry skin.  . fentaNYL (DURAGESIC) 25 MCG/HR Place 1 patch onto the skin every 3 (three) days.  Marland Kitchen lactose free nutrition (BOOST) LIQD Take 237 mLs by mouth daily.   Marland Kitchen loperamide (IMODIUM) 2 MG capsule Take 2 mg by mouth as needed for diarrhea or loose stools.  Marland Kitchen morphine (ROXANOL) 20 MG/ML concentrated solution Take 0.25 mLs (5 mg total) by mouth 2 (two) times daily.  Marland Kitchen morphine (ROXANOL) 20 MG/ML concentrated solution Take 0.25 mLs (5 mg total) by mouth every 4 (four) hours as needed for severe pain.  Marland Kitchen nystatin cream (MYCOSTATIN) Apply 1 application topically 2 (two) times daily as needed.   . phenylephrine (,USE FOR PREPARATION-H,) 0.25 % suppository Place 1 suppository rectally as needed for hemorrhoids.  . polyethylene glycol (MIRALAX / GLYCOLAX) packet Take 17 g by mouth every other day.  . [DISCONTINUED] sodium fluoride (PREVIDENT) 1.1 % GEL dental gel Place 1 application onto teeth at bedtime.   No facility-administered encounter medications on file as of 11/30/2018.     Review of Systems  Unable to perform ROS: Dementia    Immunization History  Administered Date(s) Administered  . Influenza Inj Mdck Quad Pf 08/05/2016  . Influenza Whole  08/01/2013  . Influenza,inj,Quad PF,6+ Mos 08/08/2018  . Influenza-Unspecified 08/01/2014, 07/31/2015, 08/05/2016, 10/19/2017  . PPD Test 03/09/2012  . Pneumococcal Conjugate-13 03/04/2015  . Pneumococcal Polysaccharide-23 10/18/1998  . Td 11/04/2011  . Zoster Recombinat (Shingrix) 11/16/2017, 01/27/2018   Pertinent  Health Maintenance Due  Topic Date Due  . OPHTHALMOLOGY EXAM  04/23/2017  . URINE MICROALBUMIN  04/18/2018  . HEMOGLOBIN A1C  11/03/2018  . FOOT EXAM  06/06/2019  . INFLUENZA VACCINE  Completed  . DEXA SCAN  Completed  . PNA vac Low Risk Adult  Completed   Fall Risk  10/10/2018 03/10/2016 09/10/2015 09/02/2014 08/29/2013  Falls in the past year? 0 No No No No  Number falls in past yr: 0 - - - -  Injury with Fall? 0 - - - -  Risk for fall due to : - - History of fall(s);Impaired balance/gait;Impaired mobility;Mental status change - -   Functional Status Survey:    Vitals:   11/30/18 1553  BP: 123/78  Pulse: 74  Resp: 16  Temp: 97.9 F (36.6 C)  SpO2: 94%   There is no height or weight on file to calculate BMI. Physical Exam  Constitutional:      General: She is not in acute distress. Cardiovascular:     Rate and Rhythm: Normal rate and regular rhythm.     Pulses:          Dorsalis pedis pulses are 0 on the right side and 0 on the left side.       Posterior tibial pulses are 0 on the right side and 0 on the left side.  Pulmonary:     Effort: Pulmonary effort is normal.     Breath sounds: Normal breath sounds.  Musculoskeletal:     Right lower leg: Edema (+1) present.     Left lower leg: Edema (+1) present.  Skin:    General: Skin is warm and dry.     Findings: Erythema (right inner lower calf area without wound, swelling, warmth, or drainage. Mildly tender.) present.     Comments: Left 2nd and 5th toe black discoloration and shriveled appearance.   Neurological:     General: No focal deficit present.     Mental Status: She is alert. Mental status is  at baseline.     Comments: Oriented to self, MAE  Psychiatric:        Mood and Affect: Mood normal.     Labs reviewed: No results for input(s): NA, K, CL, CO2, GLUCOSE, BUN, CREATININE, CALCIUM, MG, PHOS in the last 8760 hours. No results for input(s): AST, ALT, ALKPHOS, BILITOT, PROT, ALBUMIN in the last 8760 hours. Recent Labs    05/03/18 08/08/18 0600  WBC 13.8 13.3  HGB 17.1* 14.9  HCT 55* 45  PLT 456* 580*   Lab Results  Component Value Date   TSH 3.30 07/19/2017   Lab Results  Component Value Date   HGBA1C 6.7 05/03/2018   Lab Results  Component Value Date   CHOL 173 08/27/2015   HDL 38 08/27/2015   LDLCALC 102 08/27/2015   TRIG 163 (A) 08/27/2015    Significant Diagnostic Results in last 30 days:  No results found.  Assessment/Plan 1. Leg erythema There is mild erythema noted to the right leg without warmth, spreading, or fever. She has underlying gangrene to the left leg and is nearing the end of life. I recommend ice tid to the right leg x 72 hrs and monitoring only in the context of this setting. Followed by hospice.     Family/ staff Communication: discussed with his nurse  Labs/tests ordered:  NA

## 2018-12-04 ENCOUNTER — Other Ambulatory Visit: Payer: Self-pay | Admitting: Adult Health

## 2018-12-04 DIAGNOSIS — I739 Peripheral vascular disease, unspecified: Secondary | ICD-10-CM

## 2018-12-04 DIAGNOSIS — I96 Gangrene, not elsewhere classified: Secondary | ICD-10-CM

## 2018-12-04 MED ORDER — FENTANYL 25 MCG/HR TD PT72: 1.0000 | MEDICATED_PATCH | TRANSDERMAL | 0 refills | Status: DC

## 2018-12-18 ENCOUNTER — Non-Acute Institutional Stay (SKILLED_NURSING_FACILITY): Payer: Medicare Other | Admitting: Adult Health

## 2018-12-18 ENCOUNTER — Encounter: Payer: Self-pay | Admitting: Adult Health

## 2018-12-18 DIAGNOSIS — M25532 Pain in left wrist: Secondary | ICD-10-CM

## 2018-12-18 DIAGNOSIS — M25432 Effusion, left wrist: Secondary | ICD-10-CM

## 2018-12-18 NOTE — Progress Notes (Signed)
Location:  Occupational psychologist of Service:  SNF (31) Provider:   Cindi Carbon, ANP [place] [phone]   Gayland Curry, DO  Patient Care Team: Gayland Curry, DO as PCP - General (Geriatric Medicine) Community, Well Orpah Greek, MD as Consulting Physician (Gastroenterology) Lorretta Harp, MD as Consulting Physician (Cardiology) Garvin Fila, MD as Consulting Physician (Neurology) Bjorn Loser, MD as Consulting Physician (Urology) Pedro Earls, MD as Attending Physician [facility])  Extended Emergency Contact Information Primary Emergency Contact: Allena Earing [place] of [place] Phone: [phone] Relation: Friend Secondary Emergency Contact: [facility]  [place] Phone: [phone] Relation: Other  Code Status:  DNR Goals of care: Advanced Directive information Advanced Directives 10/10/2018  Does Patient Have a Medical Advance Directive? Yes  Type of Paramedic of [place];Living will;Out of facility DNR (pink MOST or yellow form)  Does patient want to make changes to medical advance directive? No - Patient declined  Copy of [place] in Chart? Yes - validated most recent copy scanned in chart (See row information)  Would patient like information on creating a medical advance directive? No - Patient declined  Pre-existing out of facility DNR order (yellow form or pink MOST form) Yellow form placed in chart (order not valid for inpatient use);Pink MOST form placed in chart (order not valid for inpatient use)     Chief Complaint  Patient presents with  . Acute Visit    left wrist swelling    HPI:  Pt is a 85 y.o. female seen today for an acute visit for left wrist swelling. The symptoms have been present for two days with pain and redness as well. She has reduced ROM due to the pain. She is followed  by hospice due to dementia, PVD and necrotic extremity. Her goals of care are comfort based. No fever noted.    Past Medical History:  Diagnosis Date  . Abnormality of gait 11/18/2008  . Anxiety   . Basilar artery syndrome   . Closed fracture of intertrochanteric section of femur ([place]) 03/04/2010  . Corns and callosities 02/05/2008  . Depressive disorder   . Diabetes mellitus   . Diarrhea 12/13/2012  . Dysphagia   . Edema 10/25/2007  . Fecal smearing 04/05/2012  . Hyperlipidemia   . Hypertension   . Insomnia   . Macular degeneration (senile) of retina, unspecified 03/17/2011  . Neuropathy   . Osteoporosis, senile   . Other atopic dermatitis and related conditions 12/13/2012  . Pain in joint, ankle and foot 08/07/2008  . Pain in joint, pelvic region and thigh 10/2008  . Pain in limb 11/18/2008  . Paroxysmal atrial tachycardia ([place])   . Peripheral vascular disease, unspecified ([place]) 08/14/2012  . Plantar fascial fibromatosis   . Rash and other nonspecific skin eruption 12/21/2010  . Rickets, active   . Spinal stenosis, unspecified region other than cervical 11/18/2008  . TIA (transient ischemic attack)   . Transient ischemic attack (TIA), and cerebral infarction without residual deficits(V12.54) 10/28/2002  . Type II or unspecified type diabetes mellitus with peripheral circulatory disorders, uncontrolled(250.72) 02/12/2013  . Unspecified hereditary and idiopathic peripheral neuropathy 03/17/2011  . Unspecified urinary incontinence 05/13/2008  . Venous insufficiency   . Vitamin D deficiency 11/07/2007   Past Surgical History:  Procedure Laterality Date  . BREAST MASS EXCISION    . CHOLECYSTECTOMY  1964  . COLONOSCOPY  12/18/2010  Dr Oletta Lamas  . DILATION AND CURETTAGE OF UTERUS  2003   post menopausal bleeding. Bicornate uterus/doble cervix, benign path  . ESOPHAGOGASTRODUODENOSCOPY (EGD) WITH PROPOFOL N/A 11/07/2015   Procedure: ESOPHAGOGASTRODUODENOSCOPY (EGD) WITH PROPOFOL;  Surgeon: Carol Ada, MD;  [facility];  Service: Endoscopy;  Laterality: N/A;  . ESOPHAGOGASTRODUODENOSCOPY ENDOSCOPY  12/18/2010   Dr. Oletta Lamas slight gastritis  . HIP FRACTURE SURGERY Right 02/2010  . INCISION / DRAINAGE HAND / FINGER  2001   cat bite  . TONSILLECTOMY AND ADENOIDECTOMY     as a child    Allergies  Allergen Reactions  . Codeine     UNKNOWN  . Tramadol     UNKNOWN  . Vesicare [Solifenacin Succinate]     UNKNOWN    Outpatient Encounter Medications as of 12/18/2018  Medication Sig  . atropine 1 % ophthalmic solution Place 4 drops under the tongue 4 (four) times daily as needed.  . bisacodyl (DULCOLAX) 10 MG suppository Place 10 mg rectally daily as needed for moderate constipation.  . Emollient (EUCERIN) lotion Apply 1 mL topically as needed for dry skin.  . fentaNYL (DURAGESIC) 25 MCG/HR Place 1 patch onto the skin every 3 (three) days.  Marland Kitchen loperamide (IMODIUM) 2 MG capsule Take 2 mg by mouth as needed for diarrhea or loose stools.  Marland Kitchen morphine (ROXANOL) 20 MG/ML concentrated solution Take 0.25 mLs (5 mg total) by mouth 2 (two) times daily.  Marland Kitchen morphine (ROXANOL) 20 MG/ML concentrated solution Take 0.25 mLs (5 mg total) by mouth every 4 (four) hours as needed for severe pain.  . NON FORMULARY Apply 0.5 mg topically 2 (two) times daily as needed. Ativan topical gel  . nystatin cream (MYCOSTATIN) Apply 1 application topically 2 (two) times daily as needed.   . phenylephrine (,USE FOR PREPARATION-H,) 0.25 % suppository Place 1 suppository rectally as needed for hemorrhoids.  . polyethylene glycol (MIRALAX / GLYCOLAX) packet Take 17 g by mouth every other day.  . lactose free nutrition (BOOST) LIQD Take 237 mLs by mouth daily.    No facility-administered encounter medications on file as of 12/18/2018.     Review of Systems  Unable to perform ROS: Dementia    Immunization History  Administered Date(s) Administered  . Influenza Inj Mdck Quad Pf 08/05/2016  . Influenza Whole  08/01/2013  . Influenza,inj,Quad PF,6+ Mos 08/08/2018  . Influenza-Unspecified 08/01/2014, 07/31/2015, 08/05/2016, 10/19/2017  . PPD Test 03/09/2012  . Pneumococcal Conjugate-13 03/04/2015  . Pneumococcal Polysaccharide-23 10/18/1998  . Td 11/04/2011  . Zoster Recombinat (Shingrix) 11/16/2017, 01/27/2018   Pertinent  Health Maintenance Due  Topic Date Due  . OPHTHALMOLOGY EXAM  04/23/2017  . URINE MICROALBUMIN  04/18/2018  . HEMOGLOBIN A1C  11/03/2018  . FOOT EXAM  06/06/2019  . INFLUENZA VACCINE  Completed  . DEXA SCAN  Completed  . PNA vac Low Risk Adult  Completed   Fall Risk  10/10/2018 03/10/2016 09/10/2015 09/02/2014 08/29/2013  Falls in the past year? 0 No No No No  Number falls in past yr: 0 - - - -  Injury with Fall? 0 - - - -  Risk for fall due to : - - History of fall(s);Impaired balance/gait;Impaired mobility;Mental status change - -   Functional Status Survey:    Vitals:   12/18/18 1324  BP: 110/74  Pulse: 98  Resp: 18  Temp: 97.7 F (36.5 C)  SpO2: 93%   There is no height or weight on file to calculate BMI. Physical Exam Vitals  signs and nursing note reviewed.  Constitutional:      General: She is not in acute distress.    Appearance: She is not diaphoretic.  HENT:     Head: Normocephalic and atraumatic.  Neck:     Vascular: No JVD.  Cardiovascular:     Rate and Rhythm: Normal rate and regular rhythm.     Heart sounds: No murmur.  Pulmonary:     Effort: Pulmonary effort is normal. No respiratory distress.     Breath sounds: Normal breath sounds. No wheezing.  Musculoskeletal:        General: Swelling (left wrist. Decreased ROM due to pain. Pain on palpation. Warmth noted) present.  Skin:    General: Skin is warm and dry.     Findings: Erythema (left wrist) present.  Neurological:     General: No focal deficit present.     Mental Status: She is alert.     Comments: Oriented to self only     Labs reviewed: No results for input(s): NA, K,  CL, CO2, GLUCOSE, BUN, CREATININE, CALCIUM, MG, PHOS in the last 8760 hours. No results for input(s): AST, ALT, ALKPHOS, BILITOT, PROT, ALBUMIN in the last 8760 hours. Recent Labs    05/03/18 08/08/18 0600  WBC 13.8 13.3  HGB 17.1* 14.9  HCT 55* 45  PLT 456* 580*   Lab Results  Component Value Date   TSH 3.30 07/19/2017   Lab Results  Component Value Date   HGBA1C 6.7 05/03/2018   Lab Results  Component Value Date   CHOL 173 08/27/2015   HDL 38 08/27/2015   LDLCALC 102 08/27/2015   TRIG 163 (A) 08/27/2015    Significant Diagnostic Results in last 30 days:  No results found.  Assessment/Plan 1. Pain and swelling of left wrist Xray left wrist Prednisone 20  Mg bid x 5 days with food due to possible gout flare (patient followed by hospice for end of life care)    Family/ staff Communication: discussed with nurse  Labs/tests ordered:  Xray left wrist

## 2018-12-25 ENCOUNTER — Encounter: Payer: Self-pay | Admitting: Adult Health

## 2018-12-25 ENCOUNTER — Non-Acute Institutional Stay (SKILLED_NURSING_FACILITY): Payer: Medicare Other | Admitting: Adult Health

## 2018-12-25 DIAGNOSIS — G301 Alzheimer's disease with late onset: Secondary | ICD-10-CM | POA: Diagnosis not present

## 2018-12-25 DIAGNOSIS — F02818 Dementia in other diseases classified elsewhere, unspecified severity, with other behavioral disturbance: Secondary | ICD-10-CM

## 2018-12-25 DIAGNOSIS — R531 Weakness: Secondary | ICD-10-CM

## 2018-12-25 DIAGNOSIS — I739 Peripheral vascular disease, unspecified: Secondary | ICD-10-CM | POA: Diagnosis not present

## 2018-12-25 DIAGNOSIS — F4321 Adjustment disorder with depressed mood: Secondary | ICD-10-CM

## 2018-12-25 DIAGNOSIS — F0281 Dementia in other diseases classified elsewhere with behavioral disturbance: Secondary | ICD-10-CM

## 2018-12-25 DIAGNOSIS — I96 Gangrene, not elsewhere classified: Secondary | ICD-10-CM | POA: Diagnosis not present

## 2018-12-25 DIAGNOSIS — R634 Abnormal weight loss: Secondary | ICD-10-CM

## 2018-12-25 MED ORDER — LORAZEPAM 2 MG/ML PO CONC: 0.5000 mg | Freq: Four times a day (QID) | ORAL | 1 refills | Status: AC | PRN

## 2018-12-25 NOTE — Progress Notes (Signed)
/  Location:  Occupational psychologist of Service:  SNF (31) Provider:   Cindi Carbon, ANP [place] [phone]   Gayland Curry, DO  Patient Care Team: Gayland Curry, DO as PCP - General (Geriatric Medicine) Community, Well Orpah Greek, MD as Consulting Physician (Gastroenterology) Lorretta Harp, MD as Consulting Physician (Cardiology) Garvin Fila, MD as Consulting Physician (Neurology) Bjorn Loser, MD as Consulting Physician (Urology) Pedro Earls, MD as Attending Physician [facility] Medicine)  Extended Emergency Contact Information Primary Emergency Contact: Allena Earing States of [place] Phone: [phone] Relation: Friend Secondary Emergency Contact: [facility]  [place] Phone: [phone] Relation: Other  Code Status:  DNR Goals of care: Advanced Directive information Advanced Directives 10/10/2018  Does Patient Have a Medical Advance Directive? Yes  Type of Paramedic of [place];Living will;Out of facility DNR (pink MOST or yellow form)  Does patient want to make changes to medical advance directive? No - Patient declined  Copy of [place] in Chart? Yes - validated most recent copy scanned in chart (See row information)  Would patient like information on creating a medical advance directive? No - Patient declined  Pre-existing out of facility DNR order (yellow form or pink MOST form) Yellow form placed in chart (order not valid for inpatient use);Pink MOST form placed in chart (order not valid for inpatient use)     Chief Complaint  Patient presents with  . Medical Management of Chronic Issues    HPI:  Pt is a 85 y.o. female seen today for medical management of chronic diseases.  She is followed by hospice. She has dry gangrene in the left leg. This started in the left 5th digit but has spread to the  other toes. She takes roxanol for pain and her nurse feels her pain is relieved.  She has vascular dementia with behaviors. Recently she had refused meds and appeared to be in the dying process and meds such as depakote and zoloft were discontinued. For the past two days she has been more withdrawn and sleepy. She is requesting to die and not wake up. The staff report that her agitation is an issue in the evening hrs in the dining room and during personal care. She hits, kicks, and spits at the staff.  This morning she will only look to the right and refuses to look to the left. Her left arm and leg are weak which is new.   Continues to lose weight Wt Readings from Last 3 Encounters:  12/25/18 116 lb 6.4 oz (52.8 kg)  11/20/18 116 lb 9.6 oz (52.9 kg)  10/10/18 129 lb (58.5 kg)    Functional status: hoyer lift, incontinent Past Medical History:  Diagnosis Date  . Abnormality of gait 11/18/2008  . Anxiety   . Basilar artery syndrome   . Closed fracture of intertrochanteric section of femur ([place]) 03/04/2010  . Corns and callosities 02/05/2008  . Depressive disorder   . Diabetes mellitus   . Diarrhea 12/13/2012  . Dysphagia   . Edema 10/25/2007  . Fecal smearing 04/05/2012  . Hyperlipidemia   . Hypertension   . Insomnia   . Macular degeneration (senile) of retina, unspecified 03/17/2011  . Neuropathy   . Osteoporosis, senile   . Other atopic dermatitis and related conditions 12/13/2012  . Pain in joint, ankle and foot 08/07/2008  . Pain in joint, pelvic region and thigh 10/2008  .  Pain in limb 11/18/2008  . Paroxysmal atrial tachycardia ([place])   . Peripheral vascular disease, unspecified ([place]) 08/14/2012  . Plantar fascial fibromatosis   . Rash and other nonspecific skin eruption 12/21/2010  . Rickets, active   . Spinal stenosis, unspecified region other than cervical 11/18/2008  . TIA (transient ischemic attack)   . Transient ischemic attack (TIA), and cerebral infarction without residual  deficits(V12.54) 10/28/2002  . Type II or unspecified type diabetes mellitus with peripheral circulatory disorders, uncontrolled(250.72) 02/12/2013  . Unspecified hereditary and idiopathic peripheral neuropathy 03/17/2011  . Unspecified urinary incontinence 05/13/2008  . Venous insufficiency   . Vitamin D deficiency 11/07/2007   Past Surgical History:  Procedure Laterality Date  . BREAST MASS EXCISION    . CHOLECYSTECTOMY  1964  . COLONOSCOPY  12/18/2010   Dr Oletta Lamas  . DILATION AND CURETTAGE OF UTERUS  2003   post menopausal bleeding. Bicornate uterus/doble cervix, benign path  . ESOPHAGOGASTRODUODENOSCOPY (EGD) WITH PROPOFOL N/A 11/07/2015   Procedure: ESOPHAGOGASTRODUODENOSCOPY (EGD) WITH PROPOFOL;  Surgeon: Carol Ada, MD;  Location: [facility] END[facility];  Service: Endoscopy;  Laterality: N/A;  . ESOPHAGOGASTRODUODENOSCOPY ENDOSCOPY  12/18/2010   Dr. Oletta Lamas slight gastritis  . HIP FRACTURE SURGERY Right 02/2010  . INCISION / DRAINAGE HAND / FINGER  2001   cat bite  . TONSILLECTOMY AND ADENOIDECTOMY     as a child    Allergies  Allergen Reactions  . Codeine     UNKNOWN  . Tramadol     UNKNOWN  . Vesicare [Solifenacin Succinate]     UNKNOWN    Outpatient Encounter Medications as of 12/25/2018  Medication Sig  . atropine 1 % ophthalmic solution Place 4 drops under the tongue 4 (four) times daily as needed.  . bisacodyl (DULCOLAX) 10 MG suppository Place 10 mg rectally daily as needed for moderate constipation.  . Emollient (EUCERIN) lotion Apply 1 mL topically as needed for dry skin.  . fentaNYL (DURAGESIC) 25 MCG/HR Place 1 patch onto the skin every 3 (three) days.  Marland Kitchen lactose free nutrition (BOOST) LIQD Take 237 mLs by mouth daily.   Marland Kitchen morphine (ROXANOL) 20 MG/ML concentrated solution Take 0.25 mLs (5 mg total) by mouth 2 (two) times daily.  Marland Kitchen morphine (ROXANOL) 20 MG/ML concentrated solution Take 0.25 mLs (5 mg total) by mouth every 4 (four) hours as needed for severe pain. (Patient  taking differently: Take 5 mg by mouth every 2 (two) hours as needed for severe pain. )  . NON FORMULARY Apply 0.5 mg topically 2 (two) times daily as needed. Ativan topical gel  . nystatin cream (MYCOSTATIN) Apply 1 application topically 2 (two) times daily as needed.   . phenylephrine (,USE FOR PREPARATION-H,) 0.25 % suppository Place 1 suppository rectally as needed for hemorrhoids.  . polyethylene glycol (MIRALAX / GLYCOLAX) packet Take 17 g by mouth every other day.  . [DISCONTINUED] loperamide (IMODIUM) 2 MG capsule Take 2 mg by mouth as needed for diarrhea or loose stools.   No facility-administered encounter medications on file as of 12/25/2018.     Review of Systems  Unable to perform ROS: Dementia    Immunization History  Administered Date(s) Administered  . Influenza Inj Mdck Quad Pf 08/05/2016  . Influenza Whole 08/01/2013  . Influenza,inj,Quad PF,6+ Mos 08/08/2018  . Influenza-Unspecified 08/01/2014, 07/31/2015, 08/05/2016, 10/19/2017  . PPD Test 03/09/2012  . Pneumococcal Conjugate-13 03/04/2015  . Pneumococcal Polysaccharide-23 10/18/1998  . Td 11/04/2011  . Zoster Recombinat (Shingrix) 11/16/2017, 01/27/2018   Pertinent  Health  Maintenance Due  Topic Date Due  . OPHTHALMOLOGY EXAM  04/23/2017  . URINE MICROALBUMIN  04/18/2018  . HEMOGLOBIN A1C  11/03/2018  . FOOT EXAM  06/06/2019  . INFLUENZA VACCINE  Completed  . DEXA SCAN  Completed  . PNA vac Low Risk Adult  Completed   Fall Risk  10/10/2018 03/10/2016 09/10/2015 09/02/2014 08/29/2013  Falls in the past year? 0 No No No No  Number falls in past yr: 0 - - - -  Injury with Fall? 0 - - - -  Risk for fall due to : - - History of fall(s);Impaired balance/gait;Impaired mobility;Mental status change - -   Functional Status Survey:    Vitals:   12/25/18 1108  Weight: 116 lb 6.4 oz (52.8 kg)   Body mass index is 18.23 kg/m.  Wt Readings from Last 3 Encounters:  12/25/18 116 lb 6.4 oz (52.8 kg)  11/20/18 116  lb 9.6 oz (52.9 kg)  10/10/18 129 lb (58.5 kg)    Physical Exam Vitals signs and nursing note reviewed.  Constitutional:      General: She is not in acute distress.    Comments: Frail thin female  Neck:     Musculoskeletal: No muscular tenderness.  Cardiovascular:     Rate and Rhythm: Normal rate. Rhythm irregular.     Heart sounds: No murmur.  Pulmonary:     Effort: Pulmonary effort is normal.     Breath sounds: Normal breath sounds.  Abdominal:     General: Abdomen is flat. Bowel sounds are normal. There is no distension.     Palpations: Abdomen is soft.  Lymphadenopathy:     Cervical: No cervical adenopathy.  Skin:    General: Skin is warm and dry.     Findings: Erythema (right heel with erythema that blanches Right heel callus noted. ) present.     Comments: Left 2nd and 5th toe are black. Other toes have a bluish discoloration. Bluish discoloration to the bottom of the forefoot noted. No tenderness or drainage.   Neurological:     Mental Status: She is alert.     Comments: No facial droop noted. Neglect of the left side. Unable to move the left arm and left leg. Difficulty performing full neuro exam due to her dementia.   Psychiatric:     Comments: Flat, lethargic     Labs reviewed: No results for input(s): NA, K, CL, CO2, GLUCOSE, BUN, CREATININE, CALCIUM, MG, PHOS in the last 8760 hours. No results for input(s): AST, ALT, ALKPHOS, BILITOT, PROT, ALBUMIN in the last 8760 hours. Recent Labs    05/03/18 08/08/18 0600  WBC 13.8 13.3  HGB 17.1* 14.9  HCT 55* 45  PLT 456* 580*   Lab Results  Component Value Date   TSH 3.30 07/19/2017   Lab Results  Component Value Date   HGBA1C 6.7 05/03/2018   Lab Results  Component Value Date   CHOL 173 08/27/2015   HDL 38 08/27/2015   LDLCALC 102 08/27/2015   TRIG 163 (A) 08/27/2015    Significant Diagnostic Results in last 30 days:  No results found.  Assessment/Plan 1. Late onset Alzheimer's disease with  behavioral disturbance (HCC) Add ativan liquid 0.5 mg q 6 prn for agitation and aggression in the evening, as well as for end of life care.   2. PVD (peripheral vascular disease) ([place]) ABI right 0.66 left 0.58 Feb of 2018 Likely worse at this point with necrotic tissue noted on the left foot.  Followed by hospice.   3. Dry gangrene (HCC) Progressive discoloration of the left, pain controlled with twice daily morphine  4. Left-sided weakness Noted this morning with left sided neglect. She is able to grasp with the left hand and does not have a facial droop. Goals of care are comfort based so no new intervention needed. Appears to be withdrawn and nearing the end of life.   5. Adjustment disorder with depressed mood Appears withdrawn and depressed. Consider resuming Zoloft but given her appearance at this point she may not benefit and her symptoms may be a part of the dying process.   6. Weight loss Due to progressive dementia with decreased intake Comfort care goals Continue boost as tolerated.     Family/ staff Communication: discussed with his nurse  Labs/tests ordered:  NA

## 2019-01-02 ENCOUNTER — Other Ambulatory Visit: Payer: Self-pay | Admitting: Internal Medicine

## 2019-01-02 DIAGNOSIS — I96 Gangrene, not elsewhere classified: Secondary | ICD-10-CM

## 2019-01-02 DIAGNOSIS — I739 Peripheral vascular disease, unspecified: Secondary | ICD-10-CM

## 2019-01-02 MED ORDER — FENTANYL 25 MCG/HR TD PT72: 1.0000 | MEDICATED_PATCH | TRANSDERMAL | 0 refills | Status: AC

## 2019-01-16 ENCOUNTER — Non-Acute Institutional Stay (SKILLED_NURSING_FACILITY): Payer: Medicare Other | Admitting: Internal Medicine

## 2019-01-16 DIAGNOSIS — G301 Alzheimer's disease with late onset: Secondary | ICD-10-CM

## 2019-01-16 DIAGNOSIS — F0281 Dementia in other diseases classified elsewhere with behavioral disturbance: Secondary | ICD-10-CM

## 2019-01-16 DIAGNOSIS — I82502 Chronic embolism and thrombosis of unspecified deep veins of left lower extremity: Secondary | ICD-10-CM

## 2019-01-16 DIAGNOSIS — R5383 Other fatigue: Secondary | ICD-10-CM

## 2019-01-16 DIAGNOSIS — F02818 Dementia in other diseases classified elsewhere, unspecified severity, with other behavioral disturbance: Secondary | ICD-10-CM

## 2019-01-16 DIAGNOSIS — E1143 Type 2 diabetes mellitus with diabetic autonomic (poly)neuropathy: Secondary | ICD-10-CM

## 2019-01-16 DIAGNOSIS — D45 Polycythemia vera: Secondary | ICD-10-CM

## 2019-01-16 DIAGNOSIS — I6340 Cerebral infarction due to embolism of unspecified cerebral artery: Secondary | ICD-10-CM

## 2019-01-16 NOTE — Progress Notes (Signed)
Patient ID: Kara Harris, female   DOB: 1923/03/21, 85 y.o.   MRN: [id number]  Location:  [place] Room Number: 128 Place of Service:  SNF [phone]) Provider:   Gayland Curry, DO  Patient Care Team: Gayland Curry, DO as PCP - General (Geriatric Medicine) Community, Well Orpah Greek, MD as Consulting Physician (Gastroenterology) Lorretta Harp, MD as Consulting Physician (Cardiology) Garvin Fila, MD as Consulting Physician (Neurology) Bjorn Loser, MD as Consulting Physician (Urology) Pedro Earls, MD as Attending Physician (Family Medicine)  Extended Emergency Contact Information Primary Emergency Contact: Allena Earing States of [place] Phone: [phone] Relation: Friend Secondary Emergency Contact: [facility]  [place] Phone: [phone] Relation: Other  Code Status:  DNR, MOST, hospice care Goals of care: Advanced Directive information Advanced Directives 10/10/2018  Does Patient Have a Medical Advance Directive? Yes  Type of Paramedic of [place];Living will;Out of facility DNR (pink MOST or yellow form)  Does patient want to make changes to medical advance directive? No - Patient declined  Copy of [place] in Chart? Yes - validated most recent copy scanned in chart (See row information)  Would patient like information on creating a medical advance directive? No - Patient declined  Pre-existing out of facility DNR order (yellow form or pink MOST form) Yellow form placed in chart (order not valid for inpatient use);Pink MOST form placed in chart (order not valid for inpatient use)     Chief Complaint  Patient presents with  . Medical Management of Chronic Issues    Routine Visit    HPI:  Pt is a 85 y.o. female seen today for medical management of chronic diseases.  Cornell Gaber is on hospice care for her  myeloproliferative disorder with emboli and progressive dementia.  She is nearing end-of-life.  Nursing reports that she has taken to bed now.  She is no longer combative with staff during care as she'd been.  She's declining a bit each day.  Jinny Blossom, her hospice nurse, saw her yesterday.  She's not eating.  The last thing she drank was boost yesterday.  She tries to open her eyes, but is no longer speaking in full sentences, says just a word or two.    When I saw her, she denied pain.  She did not open her eyes.  There was a uremic odor noted on her breath.  She appeared peaceful.  She only said 3-4 words.    She was noted to have left-sided weakness and has declined further since that occurred a few days ago.  Past Medical History:  Diagnosis Date  . Abnormality of gait 11/18/2008  . Anxiety   . Basilar artery syndrome   . Closed fracture of intertrochanteric section of femur ([place]) 03/04/2010  . Corns and callosities 02/05/2008  . Depressive disorder   . Diabetes mellitus   . Diarrhea 12/13/2012  . Dysphagia   . Edema 10/25/2007  . Fecal smearing 04/05/2012  . Hyperlipidemia   . Hypertension   . Insomnia   . Macular degeneration (senile) of retina, unspecified 03/17/2011  . Neuropathy   . Osteoporosis, senile   . Other atopic dermatitis and related conditions 12/13/2012  . Pain in joint, ankle and foot 08/07/2008  . Pain in joint, pelvic region and thigh 10/2008  . Pain in limb 11/18/2008  . Paroxysmal atrial tachycardia ([place])   . Peripheral vascular disease, unspecified ([place]) 08/14/2012  .  Plantar fascial fibromatosis   . Rash and other nonspecific skin eruption 12/21/2010  . Rickets, active   . Spinal stenosis, unspecified region other than cervical 11/18/2008  . TIA (transient ischemic attack)   . Transient ischemic attack (TIA), and cerebral infarction without residual deficits(V12.54) 10/28/2002  . Type II or unspecified type diabetes mellitus with peripheral circulatory disorders,  uncontrolled(250.72) 02/12/2013  . Unspecified hereditary and idiopathic peripheral neuropathy 03/17/2011  . Unspecified urinary incontinence 05/13/2008  . Venous insufficiency   . Vitamin D deficiency 11/07/2007   Past Surgical History:  Procedure Laterality Date  . BREAST MASS EXCISION    . CHOLECYSTECTOMY  1964  . COLONOSCOPY  12/18/2010   Dr Oletta Lamas  . DILATION AND CURETTAGE OF UTERUS  2003   post menopausal bleeding. Bicornate uterus/doble cervix, benign path  . ESOPHAGOGASTRODUODENOSCOPY (EGD) WITH PROPOFOL N/A 11/07/2015   Procedure: ESOPHAGOGASTRODUODENOSCOPY (EGD) WITH PROPOFOL;  Surgeon: Carol Ada, MD;  [facility];  Service: Endoscopy;  Laterality: N/A;  . ESOPHAGOGASTRODUODENOSCOPY ENDOSCOPY  12/18/2010   Dr. Oletta Lamas slight gastritis  . HIP FRACTURE SURGERY Right 02/2010  . INCISION / DRAINAGE HAND / FINGER  2001   cat bite  . TONSILLECTOMY AND ADENOIDECTOMY     as a child    Allergies  Allergen Reactions  . Codeine     UNKNOWN  . Tramadol     UNKNOWN  . Vesicare [Solifenacin Succinate]     UNKNOWN    Outpatient Encounter Medications as of 01/16/2019  Medication Sig  . atropine 1 % ophthalmic solution Place 4 drops under the tongue 4 (four) times daily as needed.  . bisacodyl (DULCOLAX) 10 MG suppository Place 10 mg rectally daily as needed for moderate constipation.  . Emollient (EUCERIN) lotion Apply 1 mL topically as needed for dry skin.  . fentaNYL (DURAGESIC) 25 MCG/HR Place 1 patch onto the skin every 3 (three) days.  Marland Kitchen lactose free nutrition (BOOST) LIQD Take 237 mLs by mouth daily.   Marland Kitchen LORazepam (ATIVAN) 2 MG/ML concentrated solution Take 0.3 mLs (0.6 mg total) by mouth every 6 (six) hours as needed for anxiety.  Marland Kitchen morphine (ROXANOL) 20 MG/ML concentrated solution Take 0.25 mLs (5 mg total) by mouth 2 (two) times daily.  Marland Kitchen morphine (ROXANOL) 20 MG/ML concentrated solution Take 5 mg by mouth every 2 (two) hours as needed for severe pain.  Marland Kitchen nystatin  cream (MYCOSTATIN) Apply 1 application topically 2 (two) times daily as needed.   . phenylephrine (,USE FOR PREPARATION-H,) 0.25 % suppository Place 1 suppository rectally as needed for hemorrhoids.  . polyethylene glycol (MIRALAX / GLYCOLAX) packet Take 17 g by mouth every other day.  . [DISCONTINUED] morphine (ROXANOL) 20 MG/ML concentrated solution Take 0.25 mLs (5 mg total) by mouth every 4 (four) hours as needed for severe pain. (Patient taking differently: Take 5 mg by mouth every 2 (two) hours as needed for severe pain. )  . [DISCONTINUED] NON FORMULARY Apply 0.5 mg topically 2 (two) times daily as needed. Ativan topical gel   No facility-administered encounter medications on file as of 01/16/2019.     Review of Systems  Unable to perform ROS: Other (resident nearing end-of-life, speaking minimally, obtained limited ROS from nursing, pt did deny pain)  Constitutional: Positive for activity change, appetite change, fatigue and unexpected weight change. Negative for fever.  Respiratory: Negative for cough, choking and shortness of breath.   Cardiovascular: Negative for chest pain and leg swelling.  Gastrointestinal: Negative for abdominal pain and constipation.  Genitourinary: Negative for flank pain.  Musculoskeletal: Positive for gait problem. Negative for arthralgias.  Skin: Negative for color change.  Neurological: Positive for weakness. Negative for dizziness.  Psychiatric/Behavioral: Positive for confusion. Negative for agitation, behavioral problems and sleep disturbance. The patient is not nervous/anxious.     Immunization History  Administered Date(s) Administered  . Influenza Inj Mdck Quad Pf 08/05/2016  . Influenza Whole 08/01/2013  . Influenza,inj,Quad PF,6+ Mos 08/08/2018  . Influenza-Unspecified 08/01/2014, 07/31/2015, 08/05/2016, 10/19/2017  . PPD Test 03/09/2012  . Pneumococcal Conjugate-13 03/04/2015  . Pneumococcal Polysaccharide-23 10/18/1998  . Td 11/04/2011  .  Zoster Recombinat (Shingrix) 11/16/2017, 01/27/2018   Pertinent  Health Maintenance Due  Topic Date Due  . OPHTHALMOLOGY EXAM  04/23/2017  . URINE MICROALBUMIN  04/18/2018  . HEMOGLOBIN A1C  11/03/2018  . FOOT EXAM  06/06/2019  . INFLUENZA VACCINE  Completed  . DEXA SCAN  Completed  . PNA vac Low Risk Adult  Completed   Fall Risk  10/10/2018 03/10/2016 09/10/2015 09/02/2014 08/29/2013  Falls in the past year? 0 No No No No  Number falls in past yr: 0 - - - -  Injury with Fall? 0 - - - -  Risk for fall due to : - - History of fall(s);Impaired balance/gait;Impaired mobility;Mental status change - -   Functional Status Survey:    There were no vitals filed for this visit. There is no height or weight on file to calculate BMI. Physical Exam Vitals signs and nursing note reviewed.  Constitutional:      General: She is not in acute distress.    Comments: Cachectic, frail female resting in bed, did not open eyes, mouth open and dry; uremic odor noted  HENT:     Head: Normocephalic and atraumatic.  Cardiovascular:     Rate and Rhythm: Normal rate and regular rhythm.  Pulmonary:     Breath sounds: Normal breath sounds.  Abdominal:     Palpations: Abdomen is soft.  Skin:    General: Skin is warm and dry.  Neurological:     Comments: Minimally responsive, says a few words that are difficult to understand, does not open eyes     Labs reviewed: No results for input(s): NA, K, CL, CO2, GLUCOSE, BUN, CREATININE, CALCIUM, MG, PHOS in the last 8760 hours. No results for input(s): AST, ALT, ALKPHOS, BILITOT, PROT, ALBUMIN in the last 8760 hours. Recent Labs    05/03/18 08/08/18 0600  WBC 13.8 13.3  HGB 17.1* 14.9  HCT 55* 45  PLT 456* 580*   Lab Results  Component Value Date   TSH 3.30 07/19/2017   Lab Results  Component Value Date   HGBA1C 6.7 05/03/2018   Lab Results  Component Value Date   CHOL 173 08/27/2015   HDL 38 08/27/2015   LDLCALC 102 08/27/2015   TRIG 163  (A) 08/27/2015    Assessment/Plan 1. Polycythemia vera ([place]) -myeloproliferative disorder with embolic phenomena causing many of her latest complications including hemiparesis, dvt and gangrene -now at end-of-life--appears comfortable and at peace in her bed today  2. Cerebrovascular accident (CVA) due to embolism of cerebral artery ([place]) -with hemiparesis within this week, cont comfort care with pain mgt   3. Chronic deep vein thrombosis (DVT) of left lower extremity, unspecified vein (HCC) -has had gangrene of toes as a result of emboli -not having pain at this point with her roxanol in place  4. Controlled type 2 diabetes mellitus with diabetic autonomic neuropathy, without long-term current  use of insulin (HCC) -has been controlled much of time in snf, no longer checking values with lack of intake and comfort goals  5. Late onset Alzheimer's disease with behavioral disturbance ([place]) -progressive especially over the past year with her overall declining health  6. Lethargy -due to end-of-life and suspected recent embolic cva related to her p vera myeloproliferative disorder  Family/ staff Communication: discussed with SNF nurse  Labs/tests ordered:  None, comfort care  Hamna Asa L. Eliot Popper, D.O. [facility] [address], [place] [address] Cell Phone (Mon-Fri 8am-5pm):  [phone] On Call:  [phone] & follow prompts after 5pm & weekends Office Phone:  [phone] Office Fax:  [phone]

## 2019-01-18 ENCOUNTER — Other Ambulatory Visit: Payer: Self-pay | Admitting: Adult Health

## 2019-01-18 MED ORDER — MORPHINE SULFATE (CONCENTRATE) 20 MG/ML PO SOLN: 5.0000 mg | Freq: Two times a day (BID) | ORAL | 0 refills | Status: AC

## 2019-01-22 ENCOUNTER — Encounter: Payer: Self-pay | Admitting: Internal Medicine

## 2019-01-28 ENCOUNTER — Encounter: Payer: Self-pay | Admitting: Internal Medicine

## 2019-02-16 DEATH — deceased

## 2024-01-20 ENCOUNTER — Other Ambulatory Visit: Payer: Self-pay
